# Patient Record
Sex: Female | Born: 1935 | Race: White | Hispanic: No | Smoking: Former smoker
Health system: Southern US, Community
[De-identification: ages and names within clinical notes are randomized; demographics above are authoritative.]

## PROBLEM LIST (undated history)

## (undated) DIAGNOSIS — I517 Cardiomegaly: Secondary | ICD-10-CM

## (undated) DIAGNOSIS — I10 Essential (primary) hypertension: Secondary | ICD-10-CM

## (undated) DIAGNOSIS — L570 Actinic keratosis: Secondary | ICD-10-CM

## (undated) DIAGNOSIS — E785 Hyperlipidemia, unspecified: Secondary | ICD-10-CM

## (undated) DIAGNOSIS — M1990 Unspecified osteoarthritis, unspecified site: Secondary | ICD-10-CM

## (undated) DIAGNOSIS — I509 Heart failure, unspecified: Secondary | ICD-10-CM

## (undated) DIAGNOSIS — R519 Headache, unspecified: Secondary | ICD-10-CM

## (undated) DIAGNOSIS — N6019 Diffuse cystic mastopathy of unspecified breast: Secondary | ICD-10-CM

## (undated) HISTORY — DX: Heart failure, unspecified: I50.9

## (undated) HISTORY — DX: Actinic keratosis: L57.0

## (undated) HISTORY — DX: Unspecified osteoarthritis, unspecified site: M19.90

## (undated) HISTORY — DX: Diffuse cystic mastopathy of unspecified breast: N60.19

## (undated) HISTORY — DX: Hyperlipidemia, unspecified: E78.5

## (undated) HISTORY — DX: Cardiomegaly: I51.7

## (undated) HISTORY — DX: Headache, unspecified: R51.9

## (undated) HISTORY — DX: Essential (primary) hypertension: I10

---

## 1988-12-18 HISTORY — PX: BREAST BIOPSY: SHX20

## 2006-03-06 ENCOUNTER — Ambulatory Visit: Payer: Self-pay | Admitting: Internal Medicine

## 2007-06-06 ENCOUNTER — Ambulatory Visit: Payer: Self-pay | Admitting: Internal Medicine

## 2008-07-29 ENCOUNTER — Ambulatory Visit: Payer: Self-pay | Admitting: Internal Medicine

## 2008-08-05 ENCOUNTER — Ambulatory Visit: Payer: Self-pay | Admitting: Internal Medicine

## 2009-08-02 ENCOUNTER — Ambulatory Visit: Payer: Self-pay | Admitting: Internal Medicine

## 2010-09-21 ENCOUNTER — Ambulatory Visit: Payer: Self-pay | Admitting: Internal Medicine

## 2010-12-07 DIAGNOSIS — C4492 Squamous cell carcinoma of skin, unspecified: Secondary | ICD-10-CM

## 2010-12-07 DIAGNOSIS — Z8589 Personal history of malignant neoplasm of other organs and systems: Secondary | ICD-10-CM

## 2010-12-07 HISTORY — DX: Squamous cell carcinoma of skin, unspecified: C44.92

## 2010-12-07 HISTORY — DX: Personal history of malignant neoplasm of other organs and systems: Z85.89

## 2011-12-15 ENCOUNTER — Ambulatory Visit: Payer: Self-pay | Admitting: Internal Medicine

## 2012-10-24 ENCOUNTER — Ambulatory Visit: Payer: Self-pay | Admitting: Unknown Physician Specialty

## 2012-12-16 ENCOUNTER — Ambulatory Visit: Payer: Self-pay | Admitting: Internal Medicine

## 2013-12-17 ENCOUNTER — Ambulatory Visit: Payer: Self-pay | Admitting: Family Medicine

## 2014-12-22 ENCOUNTER — Ambulatory Visit: Payer: Self-pay | Admitting: Family Medicine

## 2016-02-04 DIAGNOSIS — J029 Acute pharyngitis, unspecified: Secondary | ICD-10-CM | POA: Diagnosis not present

## 2016-04-11 DIAGNOSIS — I781 Nevus, non-neoplastic: Secondary | ICD-10-CM | POA: Diagnosis not present

## 2016-04-11 DIAGNOSIS — L814 Other melanin hyperpigmentation: Secondary | ICD-10-CM | POA: Diagnosis not present

## 2016-04-11 DIAGNOSIS — L578 Other skin changes due to chronic exposure to nonionizing radiation: Secondary | ICD-10-CM | POA: Diagnosis not present

## 2016-04-11 DIAGNOSIS — L570 Actinic keratosis: Secondary | ICD-10-CM | POA: Diagnosis not present

## 2016-04-11 DIAGNOSIS — Z85828 Personal history of other malignant neoplasm of skin: Secondary | ICD-10-CM | POA: Diagnosis not present

## 2016-04-11 DIAGNOSIS — L821 Other seborrheic keratosis: Secondary | ICD-10-CM | POA: Diagnosis not present

## 2016-04-11 DIAGNOSIS — I8393 Asymptomatic varicose veins of bilateral lower extremities: Secondary | ICD-10-CM | POA: Diagnosis not present

## 2016-04-11 DIAGNOSIS — Z1283 Encounter for screening for malignant neoplasm of skin: Secondary | ICD-10-CM | POA: Diagnosis not present

## 2016-04-11 DIAGNOSIS — D1800 Hemangioma unspecified site: Secondary | ICD-10-CM | POA: Diagnosis not present

## 2016-06-08 DIAGNOSIS — H2513 Age-related nuclear cataract, bilateral: Secondary | ICD-10-CM | POA: Diagnosis not present

## 2016-06-08 DIAGNOSIS — H1851 Endothelial corneal dystrophy: Secondary | ICD-10-CM | POA: Diagnosis not present

## 2016-10-24 DIAGNOSIS — M25561 Pain in right knee: Secondary | ICD-10-CM | POA: Diagnosis not present

## 2016-10-24 DIAGNOSIS — G8929 Other chronic pain: Secondary | ICD-10-CM | POA: Diagnosis not present

## 2017-01-23 DIAGNOSIS — Z0000 Encounter for general adult medical examination without abnormal findings: Secondary | ICD-10-CM | POA: Diagnosis not present

## 2017-01-23 DIAGNOSIS — M25562 Pain in left knee: Secondary | ICD-10-CM | POA: Diagnosis not present

## 2017-01-26 ENCOUNTER — Other Ambulatory Visit: Payer: Self-pay | Admitting: Family Medicine

## 2017-01-26 DIAGNOSIS — Z1231 Encounter for screening mammogram for malignant neoplasm of breast: Secondary | ICD-10-CM

## 2017-02-26 ENCOUNTER — Ambulatory Visit: Payer: Self-pay

## 2017-02-26 ENCOUNTER — Ambulatory Visit
Admission: RE | Admit: 2017-02-26 | Discharge: 2017-02-26 | Disposition: A | Discharge status: Home / Self Care | Payer: PPO | Source: Ambulatory Visit | Attending: Family Medicine | Admitting: Family Medicine

## 2017-02-26 DIAGNOSIS — Z1231 Encounter for screening mammogram for malignant neoplasm of breast: Secondary | ICD-10-CM | POA: Diagnosis not present

## 2017-04-17 DIAGNOSIS — D1800 Hemangioma unspecified site: Secondary | ICD-10-CM | POA: Diagnosis not present

## 2017-04-17 DIAGNOSIS — D229 Melanocytic nevi, unspecified: Secondary | ICD-10-CM | POA: Diagnosis not present

## 2017-04-17 DIAGNOSIS — L820 Inflamed seborrheic keratosis: Secondary | ICD-10-CM | POA: Diagnosis not present

## 2017-04-17 DIAGNOSIS — Z1283 Encounter for screening for malignant neoplasm of skin: Secondary | ICD-10-CM | POA: Diagnosis not present

## 2017-04-17 DIAGNOSIS — I8393 Asymptomatic varicose veins of bilateral lower extremities: Secondary | ICD-10-CM | POA: Diagnosis not present

## 2017-04-17 DIAGNOSIS — Z85828 Personal history of other malignant neoplasm of skin: Secondary | ICD-10-CM | POA: Diagnosis not present

## 2017-04-17 DIAGNOSIS — L821 Other seborrheic keratosis: Secondary | ICD-10-CM | POA: Diagnosis not present

## 2017-06-07 DIAGNOSIS — H5203 Hypermetropia, bilateral: Secondary | ICD-10-CM | POA: Diagnosis not present

## 2017-06-07 DIAGNOSIS — H1851 Endothelial corneal dystrophy: Secondary | ICD-10-CM | POA: Diagnosis not present

## 2017-06-07 DIAGNOSIS — H53022 Refractive amblyopia, left eye: Secondary | ICD-10-CM | POA: Diagnosis not present

## 2017-06-07 DIAGNOSIS — H2513 Age-related nuclear cataract, bilateral: Secondary | ICD-10-CM | POA: Diagnosis not present

## 2017-06-07 DIAGNOSIS — H524 Presbyopia: Secondary | ICD-10-CM | POA: Diagnosis not present

## 2017-07-05 DIAGNOSIS — M25561 Pain in right knee: Secondary | ICD-10-CM | POA: Diagnosis not present

## 2017-10-31 DIAGNOSIS — J029 Acute pharyngitis, unspecified: Secondary | ICD-10-CM | POA: Diagnosis not present

## 2018-03-18 ENCOUNTER — Other Ambulatory Visit: Payer: Self-pay | Admitting: Family Medicine

## 2018-03-18 DIAGNOSIS — Z1231 Encounter for screening mammogram for malignant neoplasm of breast: Secondary | ICD-10-CM

## 2018-03-25 ENCOUNTER — Ambulatory Visit
Admission: RE | Admit: 2018-03-25 | Discharge: 2018-03-25 | Disposition: A | Discharge status: Home / Self Care | Payer: PPO | Source: Ambulatory Visit | Attending: Family Medicine | Admitting: Family Medicine

## 2018-03-25 DIAGNOSIS — Z1231 Encounter for screening mammogram for malignant neoplasm of breast: Secondary | ICD-10-CM | POA: Insufficient documentation

## 2018-04-22 DIAGNOSIS — I8393 Asymptomatic varicose veins of bilateral lower extremities: Secondary | ICD-10-CM | POA: Diagnosis not present

## 2018-04-22 DIAGNOSIS — Z1283 Encounter for screening for malignant neoplasm of skin: Secondary | ICD-10-CM | POA: Diagnosis not present

## 2018-04-22 DIAGNOSIS — L821 Other seborrheic keratosis: Secondary | ICD-10-CM | POA: Diagnosis not present

## 2018-04-22 DIAGNOSIS — L570 Actinic keratosis: Secondary | ICD-10-CM | POA: Diagnosis not present

## 2018-04-22 DIAGNOSIS — L578 Other skin changes due to chronic exposure to nonionizing radiation: Secondary | ICD-10-CM | POA: Diagnosis not present

## 2018-04-22 DIAGNOSIS — Z85828 Personal history of other malignant neoplasm of skin: Secondary | ICD-10-CM | POA: Diagnosis not present

## 2018-04-22 DIAGNOSIS — D229 Melanocytic nevi, unspecified: Secondary | ICD-10-CM | POA: Diagnosis not present

## 2018-04-22 DIAGNOSIS — L720 Epidermal cyst: Secondary | ICD-10-CM | POA: Diagnosis not present

## 2018-04-22 DIAGNOSIS — D2262 Melanocytic nevi of left upper limb, including shoulder: Secondary | ICD-10-CM | POA: Diagnosis not present

## 2018-04-22 DIAGNOSIS — D485 Neoplasm of uncertain behavior of skin: Secondary | ICD-10-CM | POA: Diagnosis not present

## 2018-04-22 DIAGNOSIS — D1801 Hemangioma of skin and subcutaneous tissue: Secondary | ICD-10-CM | POA: Diagnosis not present

## 2018-04-22 DIAGNOSIS — L812 Freckles: Secondary | ICD-10-CM | POA: Diagnosis not present

## 2018-05-28 DIAGNOSIS — M25562 Pain in left knee: Secondary | ICD-10-CM | POA: Diagnosis not present

## 2018-06-19 DIAGNOSIS — H1851 Endothelial corneal dystrophy: Secondary | ICD-10-CM | POA: Diagnosis not present

## 2018-06-19 DIAGNOSIS — H53022 Refractive amblyopia, left eye: Secondary | ICD-10-CM | POA: Diagnosis not present

## 2018-06-19 DIAGNOSIS — H524 Presbyopia: Secondary | ICD-10-CM | POA: Diagnosis not present

## 2018-06-19 DIAGNOSIS — H5203 Hypermetropia, bilateral: Secondary | ICD-10-CM | POA: Diagnosis not present

## 2018-06-19 DIAGNOSIS — H2513 Age-related nuclear cataract, bilateral: Secondary | ICD-10-CM | POA: Diagnosis not present

## 2018-10-01 DIAGNOSIS — L900 Lichen sclerosus et atrophicus: Secondary | ICD-10-CM | POA: Diagnosis not present

## 2018-10-01 DIAGNOSIS — N9089 Other specified noninflammatory disorders of vulva and perineum: Secondary | ICD-10-CM | POA: Diagnosis not present

## 2018-10-01 DIAGNOSIS — N3281 Overactive bladder: Secondary | ICD-10-CM | POA: Diagnosis not present

## 2018-12-31 DIAGNOSIS — N904 Leukoplakia of vulva: Secondary | ICD-10-CM | POA: Diagnosis not present

## 2018-12-31 DIAGNOSIS — N3281 Overactive bladder: Secondary | ICD-10-CM | POA: Diagnosis not present

## 2019-03-12 DIAGNOSIS — Z0000 Encounter for general adult medical examination without abnormal findings: Secondary | ICD-10-CM | POA: Diagnosis not present

## 2019-04-07 DIAGNOSIS — M25561 Pain in right knee: Secondary | ICD-10-CM | POA: Diagnosis not present

## 2019-04-07 DIAGNOSIS — M1990 Unspecified osteoarthritis, unspecified site: Secondary | ICD-10-CM | POA: Diagnosis not present

## 2019-05-06 DIAGNOSIS — Z85828 Personal history of other malignant neoplasm of skin: Secondary | ICD-10-CM | POA: Diagnosis not present

## 2019-05-06 DIAGNOSIS — L812 Freckles: Secondary | ICD-10-CM | POA: Diagnosis not present

## 2019-05-06 DIAGNOSIS — D229 Melanocytic nevi, unspecified: Secondary | ICD-10-CM | POA: Diagnosis not present

## 2019-05-06 DIAGNOSIS — I8393 Asymptomatic varicose veins of bilateral lower extremities: Secondary | ICD-10-CM | POA: Diagnosis not present

## 2019-05-06 DIAGNOSIS — Z1283 Encounter for screening for malignant neoplasm of skin: Secondary | ICD-10-CM | POA: Diagnosis not present

## 2019-05-06 DIAGNOSIS — D1800 Hemangioma unspecified site: Secondary | ICD-10-CM | POA: Diagnosis not present

## 2019-05-06 DIAGNOSIS — D225 Melanocytic nevi of trunk: Secondary | ICD-10-CM | POA: Diagnosis not present

## 2019-05-06 DIAGNOSIS — L821 Other seborrheic keratosis: Secondary | ICD-10-CM | POA: Diagnosis not present

## 2019-07-24 DIAGNOSIS — H53022 Refractive amblyopia, left eye: Secondary | ICD-10-CM | POA: Diagnosis not present

## 2019-07-24 DIAGNOSIS — H524 Presbyopia: Secondary | ICD-10-CM | POA: Diagnosis not present

## 2019-07-24 DIAGNOSIS — H2513 Age-related nuclear cataract, bilateral: Secondary | ICD-10-CM | POA: Diagnosis not present

## 2019-07-24 DIAGNOSIS — H1851 Endothelial corneal dystrophy: Secondary | ICD-10-CM | POA: Diagnosis not present

## 2019-10-07 DIAGNOSIS — E785 Hyperlipidemia, unspecified: Secondary | ICD-10-CM | POA: Diagnosis not present

## 2019-10-07 DIAGNOSIS — I10 Essential (primary) hypertension: Secondary | ICD-10-CM | POA: Diagnosis not present

## 2019-11-04 DIAGNOSIS — L821 Other seborrheic keratosis: Secondary | ICD-10-CM | POA: Diagnosis not present

## 2019-11-04 DIAGNOSIS — D1800 Hemangioma unspecified site: Secondary | ICD-10-CM | POA: Diagnosis not present

## 2019-11-04 DIAGNOSIS — L820 Inflamed seborrheic keratosis: Secondary | ICD-10-CM | POA: Diagnosis not present

## 2020-04-06 DIAGNOSIS — Z23 Encounter for immunization: Secondary | ICD-10-CM | POA: Diagnosis not present

## 2020-04-06 DIAGNOSIS — I10 Essential (primary) hypertension: Secondary | ICD-10-CM | POA: Diagnosis not present

## 2020-04-06 DIAGNOSIS — E785 Hyperlipidemia, unspecified: Secondary | ICD-10-CM | POA: Diagnosis not present

## 2020-04-06 DIAGNOSIS — Z0000 Encounter for general adult medical examination without abnormal findings: Secondary | ICD-10-CM | POA: Diagnosis not present

## 2020-04-07 DIAGNOSIS — I10 Essential (primary) hypertension: Secondary | ICD-10-CM | POA: Diagnosis not present

## 2020-04-07 DIAGNOSIS — E785 Hyperlipidemia, unspecified: Secondary | ICD-10-CM | POA: Diagnosis not present

## 2020-08-30 DIAGNOSIS — H524 Presbyopia: Secondary | ICD-10-CM | POA: Diagnosis not present

## 2020-08-30 DIAGNOSIS — H52213 Irregular astigmatism, bilateral: Secondary | ICD-10-CM | POA: Diagnosis not present

## 2020-08-30 DIAGNOSIS — H18511 Endothelial corneal dystrophy, right eye: Secondary | ICD-10-CM | POA: Diagnosis not present

## 2020-08-30 DIAGNOSIS — H5203 Hypermetropia, bilateral: Secondary | ICD-10-CM | POA: Diagnosis not present

## 2020-08-30 DIAGNOSIS — H2513 Age-related nuclear cataract, bilateral: Secondary | ICD-10-CM | POA: Diagnosis not present

## 2020-08-30 DIAGNOSIS — H53022 Refractive amblyopia, left eye: Secondary | ICD-10-CM | POA: Diagnosis not present

## 2020-11-23 ENCOUNTER — Ambulatory Visit: Payer: PPO | Admitting: Dermatology

## 2020-11-23 ENCOUNTER — Other Ambulatory Visit: Payer: Self-pay

## 2020-11-23 DIAGNOSIS — Z1283 Encounter for screening for malignant neoplasm of skin: Secondary | ICD-10-CM

## 2020-11-23 DIAGNOSIS — L578 Other skin changes due to chronic exposure to nonionizing radiation: Secondary | ICD-10-CM | POA: Diagnosis not present

## 2020-11-23 DIAGNOSIS — D229 Melanocytic nevi, unspecified: Secondary | ICD-10-CM

## 2020-11-23 DIAGNOSIS — L814 Other melanin hyperpigmentation: Secondary | ICD-10-CM | POA: Diagnosis not present

## 2020-11-23 DIAGNOSIS — L821 Other seborrheic keratosis: Secondary | ICD-10-CM

## 2020-11-23 DIAGNOSIS — L720 Epidermal cyst: Secondary | ICD-10-CM | POA: Diagnosis not present

## 2020-11-23 DIAGNOSIS — L820 Inflamed seborrheic keratosis: Secondary | ICD-10-CM | POA: Diagnosis not present

## 2020-11-23 DIAGNOSIS — D1800 Hemangioma unspecified site: Secondary | ICD-10-CM | POA: Diagnosis not present

## 2020-11-23 DIAGNOSIS — Z85828 Personal history of other malignant neoplasm of skin: Secondary | ICD-10-CM

## 2020-11-23 DIAGNOSIS — I8393 Asymptomatic varicose veins of bilateral lower extremities: Secondary | ICD-10-CM

## 2020-11-23 NOTE — Progress Notes (Signed)
Follow-Up Visit   Subjective  Alison Brewer is a 85 y.o. female who presents for the following: Annual Exam.  Patient here for full body skin exam and skin cancer screening. Patient is not aware of any new or changing spots. She has a few growths that are raised and get scaly and irritated.   The following portions of the chart were reviewed this encounter and updated as appropriate:      Review of Systems:  No other skin or systemic complaints except as noted in HPI or Assessment and Plan.  Objective  Well appearing patient in no apparent distress; mood and affect are within normal limits.  A full examination was performed including scalp, head, eyes, ears, nose, lips, neck, chest, axillae, abdomen, back, buttocks, bilateral upper extremities, bilateral lower extremities, hands, feet, fingers, toes, fingernails, and toenails. All findings within normal limits unless otherwise noted below.  Objective  Right Mid Back at braline: Subcutaneous nodule.   Objective  Mid upper back at braline x 1, R upper arm x 1, R malar cheek x 1 (3): Erythematous stuck-on, waxy papule    Objective  Bilateral Leg: Telangiectasias    Assessment & Plan  Epidermal inclusion cyst Right Mid Back at braline  Benign-appearing. Exam most consistent with an epidermal inclusion cyst. Discussed that a cyst is a benign growth that can grow over time and sometimes get irritated or inflamed. Recommend observation if it is not bothersome. Discussed option of surgical excision to remove it if it is growing, symptomatic, or other changes noted. Please call for new or changing lesions so they can be evaluated.      Inflamed seborrheic keratosis (3) Mid upper back at braline x 1, R upper arm x 1, R malar cheek x 1  Destruction of lesion - Mid upper back at braline x 1, R upper arm x 1, R malar cheek x 1  Destruction method: cryotherapy   Informed consent: discussed and consent obtained   Lesion  destroyed using liquid nitrogen: Yes   Region frozen until ice ball extended beyond lesion: Yes   Outcome: patient tolerated procedure well with no complications   Post-procedure details: wound care instructions given    Spider veins of both lower extremities Bilateral Leg  Benign, observe.       .Lentigines - Scattered tan macules - Discussed due to sun exposure - Benign, observe - Call for any changes  Seborrheic Keratoses - Stuck-on, waxy, tan-brown papules and plaques  - Discussed benign etiology and prognosis. - Observe - Call for any changes  Melanocytic Nevi - Tan-brown and/or pink-flesh-colored symmetric macules and papules - Benign appearing on exam today - Observation - Call clinic for new or changing moles - Recommend daily use of broad spectrum spf 30+ sunscreen to sun-exposed areas.   Hemangiomas - Red papules - Discussed benign nature - Observe - Call for any changes  Actinic Damage - Chronic, secondary to cumulative UV/sun exposure - diffuse scaly erythematous macules with underlying dyspigmentation - Recommend daily broad spectrum sunscreen SPF 30+ to sun-exposed areas, reapply every 2 hours as needed.  - Call for new or changing lesions.  Skin cancer screening performed today.  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  - Call if any new or changing lesions are  noted between office visits  History of Skin Cancer   - Clear at right upper elbow. Observe for recurrence. - Call clinic for new or changing lesions.   - Recommend regular skin exams, daily broad-spectrum spf 30+ sunscreen use, and photoprotection.     Return in about 1 year (around 11/23/2021) for TBSE.  Graciella Belton, RMA, am acting as scribe for [name], MD  . Documentation: I have reviewed the above documentation for accuracy and completeness, and I agree with the above.  [name] MD

## 2020-11-23 NOTE — Patient Instructions (Addendum)

## 2021-03-31 DIAGNOSIS — H0288A Meibomian gland dysfunction right eye, upper and lower eyelids: Secondary | ICD-10-CM | POA: Diagnosis not present

## 2021-03-31 DIAGNOSIS — H0288B Meibomian gland dysfunction left eye, upper and lower eyelids: Secondary | ICD-10-CM | POA: Diagnosis not present

## 2021-03-31 DIAGNOSIS — H18511 Endothelial corneal dystrophy, right eye: Secondary | ICD-10-CM | POA: Diagnosis not present

## 2021-04-19 DIAGNOSIS — D649 Anemia, unspecified: Secondary | ICD-10-CM | POA: Diagnosis not present

## 2021-04-19 DIAGNOSIS — Z0000 Encounter for general adult medical examination without abnormal findings: Secondary | ICD-10-CM | POA: Diagnosis not present

## 2021-04-19 DIAGNOSIS — E785 Hyperlipidemia, unspecified: Secondary | ICD-10-CM | POA: Diagnosis not present

## 2021-04-28 ENCOUNTER — Other Ambulatory Visit: Payer: Self-pay | Admitting: Family Medicine

## 2021-04-28 DIAGNOSIS — Z1231 Encounter for screening mammogram for malignant neoplasm of breast: Secondary | ICD-10-CM

## 2021-05-11 ENCOUNTER — Ambulatory Visit
Admission: RE | Admit: 2021-05-11 | Discharge: 2021-05-11 | Disposition: A | Discharge status: Home / Self Care | Payer: PPO | Source: Ambulatory Visit | Attending: Family Medicine | Admitting: Family Medicine

## 2021-05-11 ENCOUNTER — Other Ambulatory Visit: Payer: Self-pay

## 2021-05-11 DIAGNOSIS — Z1231 Encounter for screening mammogram for malignant neoplasm of breast: Secondary | ICD-10-CM | POA: Diagnosis not present

## 2021-11-22 DIAGNOSIS — H5203 Hypermetropia, bilateral: Secondary | ICD-10-CM | POA: Diagnosis not present

## 2021-11-22 DIAGNOSIS — H53022 Refractive amblyopia, left eye: Secondary | ICD-10-CM | POA: Diagnosis not present

## 2021-11-22 DIAGNOSIS — H524 Presbyopia: Secondary | ICD-10-CM | POA: Diagnosis not present

## 2021-11-22 DIAGNOSIS — H2513 Age-related nuclear cataract, bilateral: Secondary | ICD-10-CM | POA: Diagnosis not present

## 2021-11-22 DIAGNOSIS — H18513 Endothelial corneal dystrophy, bilateral: Secondary | ICD-10-CM | POA: Diagnosis not present

## 2021-11-22 DIAGNOSIS — H52213 Irregular astigmatism, bilateral: Secondary | ICD-10-CM | POA: Diagnosis not present

## 2021-12-13 ENCOUNTER — Encounter: Payer: PPO | Admitting: Dermatology

## 2022-01-02 ENCOUNTER — Other Ambulatory Visit: Payer: Self-pay

## 2022-01-02 ENCOUNTER — Ambulatory Visit: Payer: PPO | Admitting: Dermatology

## 2022-01-02 ENCOUNTER — Encounter: Payer: Self-pay | Admitting: Dermatology

## 2022-01-02 DIAGNOSIS — L304 Erythema intertrigo: Secondary | ICD-10-CM

## 2022-01-02 DIAGNOSIS — L578 Other skin changes due to chronic exposure to nonionizing radiation: Secondary | ICD-10-CM

## 2022-01-02 DIAGNOSIS — L814 Other melanin hyperpigmentation: Secondary | ICD-10-CM | POA: Diagnosis not present

## 2022-01-02 DIAGNOSIS — D229 Melanocytic nevi, unspecified: Secondary | ICD-10-CM

## 2022-01-02 DIAGNOSIS — Z1283 Encounter for screening for malignant neoplasm of skin: Secondary | ICD-10-CM | POA: Diagnosis not present

## 2022-01-02 DIAGNOSIS — D1800 Hemangioma unspecified site: Secondary | ICD-10-CM

## 2022-01-02 DIAGNOSIS — Z85828 Personal history of other malignant neoplasm of skin: Secondary | ICD-10-CM

## 2022-01-02 DIAGNOSIS — L821 Other seborrheic keratosis: Secondary | ICD-10-CM | POA: Diagnosis not present

## 2022-01-02 DIAGNOSIS — L570 Actinic keratosis: Secondary | ICD-10-CM

## 2022-01-02 MED ORDER — TACROLIMUS 0.1 % EX OINT: TOPICAL_OINTMENT | CUTANEOUS | 1 refills | Status: DC

## 2022-01-02 NOTE — Patient Instructions (Addendum)
Start tacrolimus ointment apply to itchy areas on upper thighs 1-2 times a day until improved. If prescription not covered, recommend OTC Gold Bond Rapid Relief Anti-Itch cream (pramoxine + menthol), CeraVe Anti-itch cream or lotion (pramoxine), Sarna lotion (Original- menthol + camphor or Sensitive- pramoxine) or Eucerin 12 hour Itch Relief lotion (menthol) up to 3 times per day to areas on body that are itchy.  Cryotherapy Aftercare  Wash gently with soap and water everyday.   Apply Vaseline and Band-Aid daily until healed.   If You Need Anything After Your Visit  If you have any questions or concerns for your doctor, please call our main line at [phone] and press option 4 to reach your doctor's medical assistant. If no one answers, please leave a voicemail as directed and we will return your call as soon as possible. Messages left after 4 pm will be answered the following business day.   You may also send [place] a message via [place]. We typically respond to MyChart messages within 1-2 business days.  For prescription refills, please ask your pharmacy to contact our office. Our fax number is [phone].  If you have an urgent issue when the clinic is closed that cannot wait until the next business day, you can page your doctor at the number below.    Please note that while we do our best to be available for urgent issues outside of office hours, we are not available 24/7.   If you have an urgent issue and are unable to reach [place], you may choose to seek medical care at your doctor's office, retail clinic, urgent care center, or emergency room.  If you have a medical emergency, please immediately call 911 or go to the emergency department.  Pager Numbers  - Dr. Nehemiah Massed: [phone]  - Dr. Laurence Ferrari: [phone]  - Dr. Nicole Kindred: [phone]  In the event of inclement weather, please call our main line at [phone] for an update on the status of any delays or  closures.  Dermatology Medication Tips: Please keep the boxes that topical medications come in in order to help keep track of the instructions about where and how to use these. Pharmacies typically print the medication instructions only on the boxes and not directly on the medication tubes.   If your medication is too expensive, please contact our office at [phone] option 4 or send [place] a message through [place].   We are unable to tell what your co-pay for medications will be in advance as this is different depending on your insurance coverage. However, we may be able to find a substitute medication at lower cost or fill out paperwork to get insurance to cover a needed medication.   If a prior authorization is required to get your medication covered by your insurance company, please allow [place] 1-2 business days to complete this process.  Drug prices often vary depending on where the prescription is filled and some pharmacies may offer cheaper prices.  The website www.goodrx.com contains coupons for medications through different pharmacies. The prices here do not account for what the cost may be with help from insurance (it may be cheaper with your insurance), but the website can give you the price if you did not use any insurance.  - You can print the associated coupon and take it with your prescription to the pharmacy.  - You may also stop by our office during regular business hours and pick up a GoodRx coupon card.  - If you need your  prescription sent electronically to a different pharmacy, notify our office through [facility] or by phone at [phone] option 4.     Si Usted Necesita Algo Despus de Su Visita  Tambin puede enviarnos un mensaje a travs de Pharmacist, community. Por lo general respondemos a los mensajes de MyChart en el transcurso de 1 a 2 das hbiles.  Para renovar recetas, por favor pida a su farmacia que se ponga en contacto con nuestra oficina. Harland Dingwall de fax  es [place] [phone].  Si tiene un asunto urgente cuando la clnica est cerrada y que no puede esperar hasta el siguiente da hbil, puede llamar/localizar a su doctor(a) al nmero que aparece a continuacin.   Por favor, tenga en cuenta que aunque hacemos todo lo posible para estar disponibles para asuntos urgentes fuera del horario de [place], no estamos disponibles las 24 horas del da, los 7 das de la [place].   Si tiene un problema urgente y no puede comunicarse con nosotros, puede optar por buscar atencin mdica  en el consultorio de su doctor(a), en una clnica privada, en un centro de atencin urgente o en una sala de emergencias.  Si tiene Engineering geologist, por favor llame inmediatamente al 911 o vaya a la sala de emergencias.  Nmeros de bper  - Dr. Nehemiah Massed: [phone]  - Dra. Moye: [phone]  - Dra. Nicole Kindred: [phone]  En caso de inclemencias del [place], por favor llame a Johnsie Kindred principal al [phone] para una actualizacin sobre el [place] de cualquier retraso o cierre.  Consejos para la medicacin en dermatologa: Por favor, guarde las cajas en las que vienen los medicamentos de uso tpico para ayudarle a seguir las instrucciones sobre dnde y cmo usarlos. Las farmacias generalmente imprimen las instrucciones del medicamento slo en las cajas y no directamente en los tubos del [place].   Si su medicamento es muy caro, por favor, pngase en contacto con Zigmund Daniel llamando al [phone] y presione la opcin 4 o envenos un mensaje a travs de Pharmacist, community.   No podemos decirle cul ser su copago por los medicamentos por adelantado ya que esto es diferente dependiendo de la cobertura de su seguro. Sin embargo, es posible que podamos encontrar un medicamento sustituto a Electrical engineer un formulario para que el seguro cubra el medicamento que se considera necesario.   Si se requiere una autorizacin previa para que su compaa de seguros [place]  su medicamento, por favor permtanos de 1 a 2 das hbiles para completar este proceso.  Los precios de los medicamentos varan con frecuencia dependiendo del Environmental consultant de dnde se surte la receta y alguna farmacias pueden ofrecer precios ms baratos.  El sitio web www.goodrx.com tiene cupones para medicamentos de Airline pilot. Los precios aqu no tienen en cuenta lo que podra costar con la ayuda del seguro (puede ser ms barato con su seguro), pero el sitio web puede darle el precio si no utiliz Research scientist (physical sciences).  - Puede imprimir el cupn correspondiente y llevarlo con su receta a la farmacia.  - Tambin puede pasar por nuestra oficina durante el horario de atencin regular y Charity fundraiser una tarjeta de cupones de GoodRx.  - Si necesita que su receta se enve electrnicamente a una farmacia diferente, informe a nuestra oficina a travs de MyChart de [place] o por telfono llamando al [phone] y presione la opcin 4.

## 2022-01-02 NOTE — Progress Notes (Signed)
Follow-Up Visit   Subjective  Alison Brewer is a 87 y.o. female who presents for the following: Annual Exam.  Patient presents for TBSE. The patient has spots, moles and lesions to be evaluated, some may be new or changing. She has a history of SCC of the left calf inferior. She does have itching of the upper medial thighs. She has tried Jergens lotion to these areas with no improvement.    The following portions of the chart were reviewed this encounter and updated as appropriate:       Review of Systems:  No other skin or systemic complaints except as noted in HPI or Assessment and Plan.  Objective  Well appearing patient in no apparent distress; mood and affect are within normal limits.  A full examination was performed including scalp, head, eyes, ears, nose, lips, neck, chest, axillae, abdomen, back, buttocks, bilateral upper extremities, bilateral lower extremities, hands, feet, fingers, toes, fingernails, and toenails. All findings within normal limits unless otherwise noted below.  medial thighs Erythema of the upper medial thighs, itchy per pt  upper nasal dorsum x 1 Pink scaly macule.    Assessment & Plan  Skin cancer screening performed today.  Actinic Damage - chronic, secondary to cumulative UV radiation exposure/sun exposure over time - diffuse scaly erythematous macules with underlying dyspigmentation - Recommend daily broad spectrum sunscreen SPF 30+ to sun-exposed areas, reapply every 2 hours as needed.  - Recommend staying in the shade or wearing long sleeves, sun glasses (UVA+UVB protection) and wide brim hats (4-inch brim around the entire circumference of the hat). - Call for new or changing lesions.  Lentigines - Scattered tan macules - Due to sun exposure - Benign-appering, observe - Recommend daily broad spectrum sunscreen SPF 30+ to sun-exposed areas, reapply every 2 hours as needed. - Call for any changes  Seborrheic Keratoses - Stuck-on,  waxy, tan-brown papules and/or plaques  - Benign-appearing - Discussed benign etiology and prognosis. - Observe - Call for any changes  Melanocytic Nevi - Tan-brown and/or pink-flesh-colored symmetric macules and papules - Benign appearing on exam today - Observation - Call clinic for new or changing moles - Recommend daily use of broad spectrum spf 30+ sunscreen to sun-exposed areas.   History of Squamous Cell Carcinoma of the Skin - No evidence of recurrence today of the left calf inferior - Recommend regular full body skin exams - Recommend daily broad spectrum sunscreen SPF 30+ to sun-exposed areas, reapply every 2 hours as needed.  - Call if any new or changing lesions are noted between office visits  Erythema intertrigo medial thighs  Intertrigo is a chronic recurrent rash that occurs in skin fold areas that may be associated with friction; heat; moisture; yeast; fungus; and bacteria.  It is exacerbated by increased movement / activity; sweating; and higher atmospheric temperature.  Recommend mild soap and moisturizing cream 1-2 times daily.    Start tacrolimus ointment apply to AA qd/bid until improved dsp 60g 1Rf.   If Rx not covered, recommend OTC Gold Bond Rapid Relief Anti-Itch cream (pramoxine + menthol), CeraVe Anti-itch cream or lotion (pramoxine), Sarna lotion (Original- menthol + camphor or Sensitive- pramoxine) or Eucerin 12 hour Itch Relief lotion (menthol) up to 3 times per day to areas on body that are itchy.   tacrolimus (PROTOPIC) 0.1 % ointment - medial thighs Apply to itchy areas on the upper inner thighs one to two times daily until improved.  AK (actinic keratosis) upper nasal dorsum x 1  Actinic keratoses are precancerous spots that appear secondary to cumulative UV radiation exposure/sun exposure over time. They are chronic with expected duration over 1 year. A portion of actinic keratoses will progress to squamous cell carcinoma of the skin. It is not  possible to reliably predict which spots will progress to skin cancer and so treatment is recommended to prevent development of skin cancer.  Recommend daily broad spectrum sunscreen SPF 30+ to sun-exposed areas, reapply every 2 hours as needed.  Recommend staying in the shade or wearing long sleeves, sun glasses (UVA+UVB protection) and wide brim hats (4-inch brim around the entire circumference of the hat). Call for new or changing lesions.  Destruction of lesion - upper nasal dorsum x 1  Destruction method: cryotherapy   Informed consent: discussed and consent obtained   Lesion destroyed using liquid nitrogen: Yes   Region frozen until ice ball extended beyond lesion: Yes   Outcome: patient tolerated procedure well with no complications   Post-procedure details: wound care instructions given   Additional details:  Prior to procedure, discussed risks of blister formation, small wound, skin dyspigmentation, or rare scar following cryotherapy. Recommend Vaseline ointment to treated areas while healing.   History of Skin Cancer   Right upper elbow clear. Observe for recurrence.  Call clinic for new or changing lesions.   Recommend regular skin exams, daily broad-spectrum spf 30+ sunscreen use, and photoprotection.     Hemangiomas - Red papules - Discussed benign nature - Observe - Call for any changes   Return in about 1 year (around 01/02/2023) for TBSE.  Alison Brewer, CMA, am acting as scribe for [name], MD . Documentation: I have reviewed the above documentation for accuracy and completeness, and I agree with the above.  [name] MD

## 2022-02-13 ENCOUNTER — Emergency Department
Admission: EM | Admit: 2022-02-13 | Discharge: 2022-02-13 | Disposition: A | Discharge status: Home / Self Care | Payer: PPO | Attending: Emergency Medicine | Admitting: Emergency Medicine

## 2022-02-13 ENCOUNTER — Other Ambulatory Visit: Payer: Self-pay

## 2022-02-13 ENCOUNTER — Emergency Department: Payer: PPO

## 2022-02-13 ENCOUNTER — Encounter: Payer: Self-pay | Admitting: Intensive Care

## 2022-02-13 DIAGNOSIS — R9431 Abnormal electrocardiogram [ECG] [EKG]: Secondary | ICD-10-CM | POA: Diagnosis not present

## 2022-02-13 DIAGNOSIS — Z20822 Contact with and (suspected) exposure to covid-19: Secondary | ICD-10-CM | POA: Diagnosis not present

## 2022-02-13 DIAGNOSIS — L989 Disorder of the skin and subcutaneous tissue, unspecified: Secondary | ICD-10-CM | POA: Insufficient documentation

## 2022-02-13 DIAGNOSIS — I517 Cardiomegaly: Secondary | ICD-10-CM | POA: Insufficient documentation

## 2022-02-13 DIAGNOSIS — R609 Edema, unspecified: Secondary | ICD-10-CM

## 2022-02-13 DIAGNOSIS — R7989 Other specified abnormal findings of blood chemistry: Secondary | ICD-10-CM | POA: Insufficient documentation

## 2022-02-13 DIAGNOSIS — I509 Heart failure, unspecified: Secondary | ICD-10-CM | POA: Insufficient documentation

## 2022-02-13 DIAGNOSIS — R600 Localized edema: Secondary | ICD-10-CM | POA: Insufficient documentation

## 2022-02-13 DIAGNOSIS — M7989 Other specified soft tissue disorders: Secondary | ICD-10-CM | POA: Diagnosis present

## 2022-02-13 DIAGNOSIS — R4182 Altered mental status, unspecified: Secondary | ICD-10-CM | POA: Insufficient documentation

## 2022-02-13 LAB — URINALYSIS, COMPLETE (UACMP) WITH MICROSCOPIC
Bilirubin Urine: NEGATIVE
Glucose, UA: NEGATIVE mg/dL
Hgb urine dipstick: NEGATIVE
Ketones, ur: 5 mg/dL — AB
Nitrite: NEGATIVE
Protein, ur: NEGATIVE mg/dL
Specific Gravity, Urine: 1.019 (ref 1.005–1.030)
Squamous Epithelial / HPF: NONE SEEN (ref 0–5)
pH: 5 (ref 5.0–8.0)

## 2022-02-13 LAB — COMPREHENSIVE METABOLIC PANEL
ALT: 22 U/L (ref 0–44)
AST: 28 U/L (ref 15–41)
Albumin: 4 g/dL (ref 3.5–5.0)
Alkaline Phosphatase: 82 U/L (ref 38–126)
Anion gap: 8 (ref 5–15)
BUN: 20 mg/dL (ref 8–23)
CO2: 28 mmol/L (ref 22–32)
Calcium: 10 mg/dL (ref 8.9–10.3)
Chloride: 105 mmol/L (ref 98–111)
Creatinine, Ser: 1.03 mg/dL — ABNORMAL HIGH (ref 0.44–1.00)
GFR, Estimated: 54 mL/min — ABNORMAL LOW (ref >60)
Glucose, Bld: 99 mg/dL (ref 70–99)
Potassium: 4.3 mmol/L (ref 3.5–5.1)
Sodium: 141 mmol/L (ref 135–145)
Total Bilirubin: 0.8 mg/dL (ref 0.3–1.2)
Total Protein: 6.5 g/dL (ref 6.5–8.1)

## 2022-02-13 LAB — RESP PANEL BY RT-PCR (FLU A&B, COVID) ARPGX2
Influenza A by PCR: NEGATIVE
Influenza B by PCR: NEGATIVE
SARS Coronavirus 2 by RT PCR: NEGATIVE

## 2022-02-13 LAB — CBC WITH DIFFERENTIAL/PLATELET
Abs Immature Granulocytes: 0.01 10*3/uL (ref 0.00–0.07)
Basophils Absolute: 0.1 10*3/uL (ref 0.0–0.1)
Basophils Relative: 1 %
Eosinophils Absolute: 0.2 10*3/uL (ref 0.0–0.5)
Eosinophils Relative: 4 %
HCT: 35.8 % — ABNORMAL LOW (ref 36.0–46.0)
Hemoglobin: 11.8 g/dL — ABNORMAL LOW (ref 12.0–15.0)
Immature Granulocytes: 0 %
Lymphocytes Relative: 24 %
Lymphs Abs: 1.1 10*3/uL (ref 0.7–4.0)
MCH: 31 pg (ref 26.0–34.0)
MCHC: 33 g/dL (ref 30.0–36.0)
MCV: 94 fL (ref 80.0–100.0)
Monocytes Absolute: 0.6 10*3/uL (ref 0.1–1.0)
Monocytes Relative: 13 %
Neutro Abs: 2.7 10*3/uL (ref 1.7–7.7)
Neutrophils Relative %: 58 %
Platelets: 184 10*3/uL (ref 150–400)
RBC: 3.81 MIL/uL — ABNORMAL LOW (ref 3.87–5.11)
RDW: 13.3 % (ref 11.5–15.5)
WBC: 4.6 10*3/uL (ref 4.0–10.5)
nRBC: 0 % (ref 0.0–0.2)

## 2022-02-13 LAB — TROPONIN I (HIGH SENSITIVITY): Troponin I (High Sensitivity): 8 ng/L (ref <18)

## 2022-02-13 LAB — LACTIC ACID, PLASMA: Lactic Acid, Venous: 1 mmol/L (ref 0.5–1.9)

## 2022-02-13 LAB — BRAIN NATRIURETIC PEPTIDE: B Natriuretic Peptide: 289.9 pg/mL — ABNORMAL HIGH (ref 0.0–100.0)

## 2022-02-13 MED ORDER — FUROSEMIDE 20 MG PO TABS: 20.0000 mg | ORAL_TABLET | Freq: Every day | ORAL | 11 refills | Status: DC

## 2022-02-13 NOTE — ED Provider Triage Note (Signed)
Emergency Medicine Provider Triage Evaluation Note  Alison Brewer , a 87 y.o. female  was evaluated in triage.  Pt complains of swelling to lower extremities, new onset of dementia.  Review of Systems  Positive: Swelling extremities, altered mental status Negative: Chest pain shortness of  Physical Exam  There were no vitals taken for this visit. Gen:   Awake, no distress   Resp:  Normal effort  MSK:   Moves extremities without difficulty, swelling in lower extremities bilaterally Other:    Medical Decision Making  Medically screening exam initiated at 12:11 PM.  Appropriate orders placed.  Ruba NALANY STEEDLEY was informed that the remainder of the evaluation will be completed by another provider, this initial triage assessment does not replace that evaluation, and the importance of remaining in the ED until their evaluation is complete.  Family member states patient's had new onset of dementia.  New onset of swelling of lower extremities. Due to altered mental status we will start sepsis work-up.  However do feel that the patient is stable.  We can await the lactic acid result prior to starting fluids.   Versie Starks, PA-C 02/13/22 1213

## 2022-02-13 NOTE — ED Triage Notes (Signed)
Patient c/o bilateral leg/ankle swelling. Also c/o red lump under left axillary. Daughter in law reports some AMS.

## 2022-02-13 NOTE — ED Provider Notes (Signed)
[facility] Provider Note    Event Date/Time   First MD Initiated Contact with Patient 02/13/22 1411     (approximate)   History   Leg Swelling   HPI  GIAVANNI Brewer is a 87 y.o. female who presents with complaints of lower extremity swelling which she first noted this morning.  She also describes an itchy bump on her left axilla which is another area of concern for her.  She denies fevers or chills.  Denies shortness of breath, no chest pain.  No history of lower extremity edema.  No new medications reported     Physical Exam   Triage Vital Signs: ED Triage Vitals  Enc Vitals Group     BP 02/13/22 1211 (!) 146/83     Pulse Rate 02/13/22 1211 79     Resp 02/13/22 1211 18     Temp 02/13/22 1211 99.1 F (37.3 C)     Temp Source 02/13/22 1211 Oral     SpO2 02/13/22 1211 95 %     Weight 02/13/22 1212 60.4 kg (133 lb 1.6 oz)     Height 02/13/22 1212 1.676 m (5\' 6" )     Head Circumference --      Peak Flow --      Pain Score 02/13/22 1211 0     Pain Loc --      Pain Edu? --      Excl. in [place]? --     Most recent vital signs: Vitals:   02/13/22 1211  BP: (!) 146/83  Pulse: 79  Resp: 18  Temp: 99.1 F (37.3 C)  SpO2: 95%     General: Awake, no distress.  CV:  Good peripheral perfusion.  Resp:  Normal effort.  Abd:  No distention.  Other:  Mild lower extremity edema bilaterally, no tenderness to palpation.  Left axilla, small red pruritic superficial lesion, does not appear consistent with lymph node   ED Results / Procedures / Treatments   Labs (all labs ordered are listed, but only abnormal results are displayed) Labs Reviewed  COMPREHENSIVE METABOLIC PANEL - Abnormal; Notable for the following components:      Result Value   Creatinine, Ser 1.03 (*)    GFR, Estimated 54 (*)    All other components within normal limits  CBC WITH DIFFERENTIAL/PLATELET - Abnormal; Notable for the following components:   RBC 3.81 (*)     Hemoglobin 11.8 (*)    HCT 35.8 (*)    All other components within normal limits  URINALYSIS, COMPLETE (UACMP) WITH MICROSCOPIC - Abnormal; Notable for the following components:   Color, Urine AMBER (*)    APPearance HAZY (*)    Ketones, ur 5 (*)    Leukocytes,Ua TRACE (*)    Bacteria, UA MANY (*)    All other components within normal limits  BRAIN NATRIURETIC PEPTIDE - Abnormal; Notable for the following components:   B Natriuretic Peptide 289.9 (*)    All other components within normal limits  RESP PANEL BY RT-PCR (FLU A&B, COVID) ARPGX2  CULTURE, BLOOD (ROUTINE X 2)  URINE CULTURE  LACTIC ACID, PLASMA  TROPONIN I (HIGH SENSITIVITY)     EKG  ED ECG REPORT I, [name], the attending physician, personally viewed and interpreted this ECG.  Date: 02/13/2022  Rhythm: normal sinus rhythm QRS Axis: normal Intervals: Incomplete right bundle branch block ST/T Wave abnormalities: normal Narrative Interpretation: no evidence of acute ischemia    RADIOLOGY  Chest x-ray  viewed and interpreted by me, mild cardiomegaly, pending radiology read   PROCEDURES:  Critical Care performed:   Procedures   MEDICATIONS ORDERED IN ED: Medications - No data to display   IMPRESSION / MDM / [place] / ED COURSE  I reviewed the triage vital signs and the nursing notes.  Patient presents with lower extremity swelling as detailed above.  Suspicious for possibly new onset CHF versus unspecified peripheral edema.  No calf tenderness or unilateral pain to suggest DVT   Lab work demonstrates mildly elevated BNP, CBC CMP and high sensor troponin are normal  X-ray demonstrates mild cardiomegaly  Exam is most consistent with peripheral edema associate with CHF given her history and work-up   Considered admission for new onset CHF however she does not have any shortness of breath nor pulmonary edema and has excellent outpatient follow-up. Electrolytes are reassuring,  potassium is normal, will start the patient on low-dose Lasix, she will follow-up with her PCP for further evaluation         FINAL CLINICAL IMPRESSION(S) / ED DIAGNOSES   Final diagnoses:  Peripheral edema     Rx / DC Orders   ED Discharge Orders          Ordered    furosemide (LASIX) 20 MG tablet  Daily        02/13/22 1430             Note:  This document was prepared using Dragon voice recognition software and may include unintentional dictation errors.   [name], MD 02/13/22 [phone]

## 2022-02-16 LAB — URINE CULTURE: Culture: >=100000 — AB

## 2022-02-18 LAB — CULTURE, BLOOD (ROUTINE X 2)
Culture: NO GROWTH
Special Requests: ADEQUATE

## 2022-02-22 DIAGNOSIS — R059 Cough, unspecified: Secondary | ICD-10-CM | POA: Diagnosis not present

## 2022-02-22 DIAGNOSIS — D649 Anemia, unspecified: Secondary | ICD-10-CM | POA: Diagnosis not present

## 2022-02-22 DIAGNOSIS — R413 Other amnesia: Secondary | ICD-10-CM | POA: Diagnosis not present

## 2022-02-22 DIAGNOSIS — I509 Heart failure, unspecified: Secondary | ICD-10-CM | POA: Diagnosis not present

## 2022-02-22 DIAGNOSIS — I517 Cardiomegaly: Secondary | ICD-10-CM | POA: Diagnosis not present

## 2022-02-22 DIAGNOSIS — N3941 Urge incontinence: Secondary | ICD-10-CM | POA: Diagnosis not present

## 2022-02-22 DIAGNOSIS — G8929 Other chronic pain: Secondary | ICD-10-CM | POA: Diagnosis not present

## 2022-02-22 DIAGNOSIS — N390 Urinary tract infection, site not specified: Secondary | ICD-10-CM | POA: Diagnosis not present

## 2022-02-22 DIAGNOSIS — M25561 Pain in right knee: Secondary | ICD-10-CM | POA: Diagnosis not present

## 2022-02-27 ENCOUNTER — Telehealth: Payer: Self-pay

## 2022-02-27 NOTE — Telephone Encounter (Signed)
Notes scanned to referral 

## 2022-03-07 ENCOUNTER — Encounter: Payer: Self-pay | Admitting: Interventional Cardiology

## 2022-03-07 ENCOUNTER — Ambulatory Visit: Payer: PPO | Admitting: Interventional Cardiology

## 2022-03-07 ENCOUNTER — Other Ambulatory Visit: Payer: Self-pay

## 2022-03-07 VITALS — BP 110/60 | HR 106 | Ht 65.0 in | Wt 125.0 lb

## 2022-03-07 DIAGNOSIS — I5032 Chronic diastolic (congestive) heart failure: Secondary | ICD-10-CM | POA: Diagnosis not present

## 2022-03-07 DIAGNOSIS — R5381 Other malaise: Secondary | ICD-10-CM | POA: Diagnosis not present

## 2022-03-07 DIAGNOSIS — R600 Localized edema: Secondary | ICD-10-CM | POA: Diagnosis not present

## 2022-03-07 DIAGNOSIS — I517 Cardiomegaly: Secondary | ICD-10-CM | POA: Diagnosis not present

## 2022-03-07 MED ORDER — FUROSEMIDE 20 MG PO TABS: 20.0000 mg | ORAL_TABLET | Freq: Every day | ORAL | 11 refills | Status: DC | PRN

## 2022-03-07 NOTE — Patient Instructions (Addendum)
Medication Instructions:  ?Your physician has recommended you make the following change in your medication: Change furosemide to 20 mg by mouth daily as needed ? ?*If you need a refill on your cardiac medications before your next appointment, please call your pharmacy* ? ? ?Lab Work: ?none ?If you have labs (blood work) drawn today and your tests are completely normal, you will receive your results only by: ?MyChart Message (if you have MyChart) OR ?A paper copy in the mail ?If you have any lab test that is abnormal or we need to change your treatment, we will call you to review the results. ? ? ?Testing/Procedures: ?none ? ? ?Follow-Up: ?At [facility], you and your health needs are our priority.  As part of our continuing mission to provide you with exceptional heart care, we have created designated Provider Care Teams.  These Care Teams include your primary Cardiologist (physician) and Advanced Practice Providers (APPs -  Physician Assistants and Nurse Practitioners) who all work together to provide you with the care you need, when you need it. ? ?We recommend signing up for the patient portal called "MyChart".  Sign up information is provided on this After Visit Summary.  MyChart is used to connect with patients for Virtual Visits (Telemedicine).  Patients are able to view lab/test results, encounter notes, upcoming appointments, etc.  Non-urgent messages can be sent to your provider as well.   ?To learn more about what you can do with MyChart, go to [email or web address].   ? ?Your next appointment:   ?12 month(s) ? ?The format for your next appointment:   ?In Person ? ?Provider:   ?Larae Grooms, MD   ? ? ?Other Instructions ?Low-Sodium Eating Plan ?Sodium, which is an element that makes up salt, helps you maintain a healthy balance of fluids in your body. Too much sodium can increase your blood pressure and cause fluid and waste to be held in your body. ?Your health care provider or dietitian may  recommend following this plan if you have high blood pressure (hypertension), kidney disease, liver disease, or heart failure. Eating less sodium can help lower your blood pressure, reduce swelling, and protect your heart, liver, and kidneys. ?What are tips for following this plan? ?Reading food labels ?The Nutrition Facts label lists the amount of sodium in one serving of the food. If you eat more than one serving, you must multiply the listed amount of sodium by the number of servings. ?Choose foods with less than 140 mg of sodium per serving. ?Avoid foods with 300 mg of sodium or more per serving. ?Shopping ? ?Look for lower-sodium products, often labeled as "low-sodium" or "no salt added." ?Always check the sodium content, even if foods are labeled as "unsalted" or "no salt added." ?Buy fresh foods. ?Avoid canned foods and pre-made or frozen meals. ?Avoid canned, cured, or processed meats. ?Buy breads that have less than 80 mg of sodium per slice. ?Cooking ? ?Eat more home-cooked food and less restaurant, buffet, and fast food. ?Avoid adding salt when cooking. Use salt-free seasonings or herbs instead of table salt or sea salt. Check with your health care provider or pharmacist before using salt substitutes. ?Cook with plant-based oils, such as canola, sunflower, or olive oil. ?Meal planning ?When eating at a restaurant, ask that your food be prepared with less salt or no salt, if possible. Avoid dishes labeled as brined, pickled, cured, smoked, or made with soy sauce, miso, or teriyaki sauce. ?Avoid foods that contain MSG (monosodium glutamate). MSG  is sometimes added to [place] food, bouillon, and some canned foods. ?Make meals that can be grilled, baked, poached, roasted, or steamed. These are generally made with less sodium. ?General information ?Most people on this plan should limit their sodium intake to 1,500-2,000 mg (milligrams) of sodium each day. ?What foods should I eat? ?Fruits ?Fresh, frozen, or  canned fruit. Fruit juice. ?Vegetables ?Fresh or frozen vegetables. "No salt added" canned vegetables. "No salt added" tomato sauce and paste. Low-sodium or reduced-sodium tomato and vegetable juice. ?Grains ?Low-sodium cereals, including oats, puffed wheat and rice, and shredded wheat. Low-sodium crackers. Unsalted rice. Unsalted pasta. Low-sodium bread. Whole-grain breads and whole-grain pasta. ?Meats and other proteins ?Fresh or frozen (no salt added) meat, poultry, seafood, and fish. Low-sodium canned tuna and salmon. Unsalted nuts. Dried peas, beans, and lentils without added salt. Unsalted canned beans. Eggs. Unsalted nut butters. ?Dairy ?Milk. Soy milk. Cheese that is naturally low in sodium, such as ricotta cheese, fresh mozzarella, or Swiss cheese. Low-sodium or reduced-sodium cheese. Cream cheese. Yogurt. ?Seasonings and condiments ?Fresh and dried herbs and spices. Salt-free seasonings. Low-sodium mustard and ketchup. Sodium-free salad dressing. Sodium-free light mayonnaise. Fresh or refrigerated horseradish. Lemon juice. Vinegar. ?Other foods ?Homemade, reduced-sodium, or low-sodium soups. Unsalted popcorn and pretzels. Low-salt or salt-free chips. ?The items listed above may not be a complete list of foods and beverages you can eat. Contact a dietitian for more information. ?What foods should I avoid? ?Vegetables ?Sauerkraut, pickled vegetables, and relishes. Olives. [place] fries. Onion rings. Regular canned vegetables (not low-sodium or reduced-sodium). Regular canned tomato sauce and paste (not low-sodium or reduced-sodium). Regular tomato and vegetable juice (not low-sodium or reduced-sodium). Frozen vegetables in sauces. ?Grains ?Instant hot cereals. Bread stuffing, pancake, and biscuit mixes. Croutons. Seasoned rice or pasta mixes. Noodle soup cups. Boxed or frozen macaroni and cheese. Regular salted crackers. Self-rising flour. ?Meats and other proteins ?Meat or fish that is salted, canned,  smoked, spiced, or pickled. Precooked or cured meat, such as sausages or meat loaves. Berniece Salines. Ham. Pepperoni. Hot dogs. Corned beef. Chipped beef. Salt pork. Jerky. Pickled herring. Anchovies and sardines. Regular canned tuna. Salted nuts. ?Dairy ?Processed cheese and cheese spreads. Hard cheeses. Cheese curds. Blue cheese. Feta cheese. String cheese. Regular cottage cheese. Buttermilk. Canned milk. ?Fats and oils ?Salted butter. Regular margarine. Ghee. Bacon fat. ?Seasonings and condiments ?Onion salt, garlic salt, seasoned salt, table salt, and sea salt. Canned and packaged gravies. Worcestershire sauce. Tartar sauce. Barbecue sauce. Teriyaki sauce. Soy sauce, including reduced-sodium. Steak sauce. Fish sauce. Oyster sauce. Cocktail sauce. Horseradish that you find on the shelf. Regular ketchup and mustard. Meat flavorings and tenderizers. Bouillon cubes. Hot sauce. Pre-made or packaged marinades. Pre-made or packaged taco seasonings. Relishes. Regular salad dressings. Salsa. ?Other foods ?Salted popcorn and pretzels. Corn chips and puffs. Potato and tortilla chips. Canned or dried soups. Pizza. Frozen entrees and pot pies. ?The items listed above may not be a complete list of foods and beverages you should avoid. Contact a dietitian for more information. ?Summary ?Eating less sodium can help lower your blood pressure, reduce swelling, and protect your heart, liver, and kidneys. ?Most people on this plan should limit their sodium intake to 1,500-2,000 mg (milligrams) of sodium each day. ?Canned, boxed, and frozen foods are high in sodium. Restaurant foods, fast foods, and pizza are also very high in sodium. You also get sodium by adding salt to food. ?Try to cook at home, eat more fresh fruits and vegetables, and eat less fast food and  canned, processed, or prepared foods. ?This information is not intended to replace advice given to you by your health care provider. Make sure you discuss any questions you have  with your health care provider. ?Document Revised: 01/09/2020 Document Reviewed: 11/05/2019 ?Elsevier Patient Education ? 2022 [place]. ? ? ?

## 2022-03-07 NOTE — Progress Notes (Signed)
?  ?Cardiology Office Note ? ? ?Date:  03/07/2022  ? ?ID:  Alison Brewer, DOB 07/28/1935, MRN [id number] ? ?PCP:  Alison Pink, MD  ? ? ?Chief Complaint  ?Patient presents with  ? Establish Care  ? ?cardiomegaly ? ?Wt Readings from Last 3 Encounters:  ?03/07/22 125 lb (56.7 kg)  ?02/13/22 133 lb 1.6 oz (60.4 kg)  ?  ? ?  ?History of Present Illness: ?Alison Brewer is a 87 y.o. female who is being seen today for the evaluation of cardiomegaly at the request of Alison Pink, MD.  ? ?She had some swelling in her ankles and altered mental status in Feb 2023.  She had a CXR which showed borderline cardiomegaly. She is sent her efor further eval. ? ?Denies : Chest pain. Dizziness. Leg edema. Nitroglycerin use. Orthopnea. Palpitations. Paroxysmal nocturnal dyspnea. Shortness of breath. Syncope.   ? ?She used to walk more.  She walks while shopping.  ? ?She was given Lasix in the [facility] ER.  She got dehydrated and then got confused.  She fell in the bathroom.  Since that time, she went to the PMD and she was diagnosed with UTI which caused the confusion.   No other recent falls. BNP was mildly elevated.  ? ? ? ?Past Medical History:  ?Diagnosis Date  ? Arthritis   ? CHF (congestive heart failure) ([place])   ? Fibrocystic breast disease   ? Frequent headaches   ? History of squamous cell carcinoma 12/07/2010  ? left calf inferior/EDC  ? HTN (hypertension)   ? Hyperlipidemia   ? Mild cardiomegaly   ? Squamous cell carcinoma of skin 12/07/2010  ? left calf inferior  ? ? ?Past Surgical History:  ?Procedure Laterality Date  ? BREAST BIOPSY Right 1990  ? neg  ? ? ? ?Current Outpatient Medications  ?Medication Sig Dispense Refill  ? aspirin 81 MG EC tablet Take by mouth.    ? ciprofloxacin (CIPRO) 250 MG tablet Take 250 mg by mouth 2 (two) times daily.    ? furosemide (LASIX) 20 MG tablet Take 1 tablet (20 mg total) by mouth daily. 30 tablet 11  ? ?No current facility-administered medications for this visit.  ? ? ?Allergies:    Patient has no known allergies.  ? ? ?Social History:  The patient  reports that she has quit smoking. Her smoking use included cigarettes. She has never used smokeless tobacco. She reports that she does not drink alcohol and does not use drugs.  ? ?Family History:  The patient's family history includes Breast cancer in her maternal aunt; Coronary artery disease in her father; Pancreatic cancer in her mother.  ? ? ?ROS:  Please see the history of present illness.   Otherwise, review of systems are positive for not allowed to drive.   All other systems are reviewed and negative.  ? ? ?PHYSICAL EXAM: ?VS:  BP 110/60   Pulse (!) 106   Ht '5\' 5"'$  (1.651 m)   Wt 125 lb (56.7 kg)   SpO2 99%   BMI 20.80 kg/m?  , BMI Body mass index is 20.8 kg/m?. ?GEN: Well nourished, well developed, in no acute distress, frail ?HEENT: normal ?Neck: no JVD, carotid bruits, or masses ?Cardiac: RRR; no murmurs, rubs, or gallops,tr ankle edema bilaterally ?Respiratory:  clear to auscultation bilaterally, normal work of breathing ?GI: soft, nontender, nondistended, + BS ?MS: no deformity or atrophy ?Skin: warm and dry, no rash; spider veins noted on the legs ?Neuro:  Strength and sensation are intact ?Psych: euthymic mood, full affect ? ? ?EKG:   ?The ekg ordered today demonstrates NSR, IRBBB ? ? ?Recent Labs: ?02/13/2022: ALT 22; B Natriuretic Peptide 289.9; BUN 20; Creatinine, Ser 1.03; Hemoglobin 11.8; Platelets 184; Potassium 4.3; Sodium 141  ? ?Lipid Panel ?No results found for: CHOL, TRIG, HDL, CHOLHDL, VLDL, LDLCALC, LDLDIRECT ?  ?Other studies Reviewed: ?Additional studies/ records that were reviewed today with results demonstrating: CXR, labs reviewed. ? ? ?ASSESSMENT AND PLAN: ? ?Alison edema: Likely age related venous insufficiency rather than systolic heart failure.  .Elevate legs is much as possible use compression stockings, 10-25 mm Hg.  I think in her case, it may be better to manage this conservatively with furosemide being the  last option.  She will monitor the swelling in her legs and monitor for any shortness of breath.  Could use the furosemide more on an as-needed basis as opposed to daily, given that she had a problem with dehydration and falling after starting the medicine.  Borderline cardiomegaly by xray.  No pulm edema. ?Likely Chronic diastolic heart failure just based on her age: From age-related stiffening of the heart.  Minimize processed foods.  Minimizing salt intake.  Okay to use the Lasix as needed when the ankles are swelling if shortness of breath develops or swelling becomes a recurrent problem, we can obtain an echocardiogram to look at  heart function.  Exam does not show signs of systolic heart failure.   ?Deconditioned: As exercise has decreased, this has worsened.  Try to safely increase exercise.  Avoid falls.  Use cane now.  Reassuring that she is feeling better in general now.  Hopefully, with more safe activity, her energy level will improve. ?Had some confusion as well in the setting of a UTI.  There may be some mild baseline confusion as well.  She asked me to contact her family member who took her car from her. ? ? ?Current medicines are reviewed at length with the patient today.  The patient concerns regarding her medicines were addressed. ? ?The following changes have been made:  No change ? ?Labs/ tests ordered today include:  ?No orders of the defined types were placed in this encounter. ? ? ?Recommend 150 minutes/week of aerobic exercise ?Low fat, low carb, high fiber diet recommended ? ?Disposition:   FU in 1 year ? ? ?Signed, ?Larae Grooms, MD  ?03/07/2022 9:03 AM    ?[facility] ?[address], [place], [place]  [address] ?Phone: [phone]; Fax: [phone]  ? ?

## 2022-04-17 ENCOUNTER — Telehealth: Payer: Self-pay | Admitting: Physician Assistant

## 2022-04-18 ENCOUNTER — Ambulatory Visit
Admission: EM | Admit: 2022-04-18 | Discharge: 2022-04-18 | Disposition: A | Discharge status: Home / Self Care | Payer: PPO | Attending: Family Medicine | Admitting: Family Medicine

## 2022-04-18 ENCOUNTER — Ambulatory Visit (INDEPENDENT_AMBULATORY_CARE_PROVIDER_SITE_OTHER): Payer: PPO

## 2022-04-18 DIAGNOSIS — S79911A Unspecified injury of right hip, initial encounter: Secondary | ICD-10-CM | POA: Diagnosis not present

## 2022-04-18 DIAGNOSIS — J189 Pneumonia, unspecified organism: Secondary | ICD-10-CM

## 2022-04-18 DIAGNOSIS — R0781 Pleurodynia: Secondary | ICD-10-CM

## 2022-04-18 DIAGNOSIS — Z87440 Personal history of urinary (tract) infections: Secondary | ICD-10-CM

## 2022-04-18 DIAGNOSIS — M25551 Pain in right hip: Secondary | ICD-10-CM

## 2022-04-18 DIAGNOSIS — M25552 Pain in left hip: Secondary | ICD-10-CM | POA: Diagnosis not present

## 2022-04-18 DIAGNOSIS — W19XXXA Unspecified fall, initial encounter: Secondary | ICD-10-CM | POA: Diagnosis not present

## 2022-04-18 DIAGNOSIS — R918 Other nonspecific abnormal finding of lung field: Secondary | ICD-10-CM | POA: Diagnosis not present

## 2022-04-18 DIAGNOSIS — R0782 Intercostal pain: Secondary | ICD-10-CM

## 2022-04-18 LAB — POCT URINALYSIS DIP (MANUAL ENTRY)
Bilirubin, UA: NEGATIVE
Glucose, UA: NEGATIVE mg/dL
Ketones, POC UA: NEGATIVE mg/dL
Leukocytes, UA: NEGATIVE
Nitrite, UA: NEGATIVE
Protein Ur, POC: 100 mg/dL — AB
Spec Grav, UA: >=1.03 — AB (ref 1.010–1.025)
Urobilinogen, UA: 0.2 E.U./dL
pH, UA: 5 (ref 5.0–8.0)

## 2022-04-18 MED ORDER — GUAIFENESIN ER 600 MG PO TB12: 600.0000 mg | ORAL_TABLET | Freq: Two times a day (BID) | ORAL | 0 refills | Status: DC | PRN

## 2022-04-18 MED ORDER — DOXYCYCLINE HYCLATE 100 MG PO CAPS: 100.0000 mg | ORAL_CAPSULE | Freq: Two times a day (BID) | ORAL | 0 refills | Status: DC

## 2022-04-18 MED ORDER — BENZONATATE 200 MG PO CAPS
200.0000 mg | ORAL_CAPSULE | Freq: Three times a day (TID) | ORAL | 0 refills | Status: DC | PRN
Start: 2022-04-18 — End: 2022-05-29

## 2022-04-18 NOTE — ED Triage Notes (Signed)
Pt's grandson states that today she fell getting up from the couch and tripped and hit her ribs in the back on the right side and her right elbow ? ?Alison Brewer states she has been congested  ? ?Alison Brewer thinks she may have a reoccurring UTI.  ? ?Pt states she is having frequent urination and some burning ? ?Denies Fever ?

## 2022-04-20 ENCOUNTER — Ambulatory Visit: Payer: PPO | Admitting: Physician Assistant

## 2022-04-22 NOTE — ED Provider Notes (Signed)
?[facility] ? ? ? ?CSN: [id number] ?Arrival date & time: 04/18/22  1258 ? ? ?  ? ?History   ?Chief Complaint ?Chief Complaint  ?Patient presents with  ? Cough  ?  UTI symptoms, cough and fell this morning  ? ? ?HPI ?Alison Brewer is a 87 y.o. female.  ? ?Presenting today with right rib/side and right hip pain following a fall that occurred today where she landed on the right side. Did not hit head or lose consciousness during fall and states pain waxes and wanes. Denies loss of ROM, numbness, tingling, weakness though grandson who is with her states she's moving slightly slower than usual. Uses a walker to ambulate both today and at baseline. Also complaining of congestion, hacking cough. Not trying anything OTC for sxs. Requesting to be checked for UTI as she tends to have recurrent UTIs and has some frequency and burning. Denies fever, chills, abdominal pain, N/V/D.  ? ? ?Past Medical History:  ?Diagnosis Date  ? Arthritis   ? CHF (congestive heart failure) ([place])   ? Fibrocystic breast disease   ? Frequent headaches   ? History of squamous cell carcinoma 12/07/2010  ? left calf inferior/EDC  ? HTN (hypertension)   ? Hyperlipidemia   ? Mild cardiomegaly   ? Squamous cell carcinoma of skin 12/07/2010  ? left calf inferior  ? ? ?There are no problems to display for this patient. ? ? ?Past Surgical History:  ?Procedure Laterality Date  ? BREAST BIOPSY Right 1990  ? neg  ? ? ?OB History   ?No obstetric history on file. ?  ? ? ? ?Home Medications   ? ?Prior to Admission medications   ?Medication Sig Start Date End Date Taking? Authorizing Provider  ?benzonatate (TESSALON) 200 MG capsule Take 1 capsule (200 mg total) by mouth 3 (three) times daily as needed for cough. 04/18/22  Yes Volney American, PA-C  ?doxycycline (VIBRAMYCIN) 100 MG capsule Take 1 capsule (100 mg total) by mouth 2 (two) times daily. 04/18/22  Yes Volney American, PA-C  ?guaiFENesin (MUCINEX) 600 MG 12 hr tablet Take 1 tablet (600  mg total) by mouth 2 (two) times daily as needed. 04/18/22  Yes Volney American, PA-C  ?aspirin 81 MG EC tablet Take by mouth.    [provider]  ?ciprofloxacin (CIPRO) 250 MG tablet Take 250 mg by mouth 2 (two) times daily.    [provider]  ?furosemide (LASIX) 20 MG tablet Take 1 tablet (20 mg total) by mouth daily as needed. 03/07/22 03/07/23  Jettie Booze, MD  ? ? ?Family History ?Family History  ?Problem Relation Age of Onset  ? Pancreatic cancer Mother   ? Coronary artery disease Father   ? Breast cancer Maternal Aunt   ? ? ?Social History ?Social History  ? ?Tobacco Use  ? Smoking status: Former  ?  Types: Cigarettes  ? Smokeless tobacco: Never  ?Vaping Use  ? Vaping Use: Never used  ?Substance Use Topics  ? Alcohol use: Never  ? Drug use: Never  ? ? ? ?Allergies   ?Patient has no known allergies. ? ? ?Review of Systems ?Review of Systems ?PER HPI ? ?Physical Exam ?Triage Vital Signs ?ED Triage Vitals [04/18/22 1440]  ?Enc Vitals Group  ?   BP 127/73  ?   Pulse Rate 83  ?   Resp 16  ?   Temp 99.2 ?F (37.3 ?C)  ?   Temp Source Oral  ?  SpO2 93 %  ?   Weight   ?   Height   ?   Head Circumference   ?   Peak Flow   ?   Pain Score 5  ?   Pain Loc   ?   Pain Edu?   ?   Excl. in [place]?   ? ?No data found. ? ?Updated Vital Signs ?BP 127/73 (BP Location: Right Arm)   Pulse 83   Temp 99.2 ?F (37.3 ?C) (Oral)   Resp 16   SpO2 93%  ? ?Visual Acuity ?Right Eye Distance:   ?Left Eye Distance:   ?Bilateral Distance:   ? ?Right Eye Near:   ?Left Eye Near:    ?Bilateral Near:    ? ?Physical Exam ?Vitals and nursing note reviewed.  ?Constitutional:   ?   General: She is not in acute distress. ?   Appearance: She is not ill-appearing.  ?HENT:  ?   Head: Atraumatic.  ?   Nose: Congestion present.  ?   Mouth/Throat:  ?   Mouth: Mucous membranes are moist.  ?Eyes:  ?   Extraocular Movements: Extraocular movements intact.  ?   Conjunctiva/sclera: Conjunctivae normal.  ?Cardiovascular:  ?   Rate and  Rhythm: Normal rate and regular rhythm.  ?   Heart sounds: Normal heart sounds.  ?Pulmonary:  ?   Effort: Pulmonary effort is normal.  ?   Breath sounds: Normal breath sounds. No wheezing.  ?   Comments: Speaking in full sentences, breathing comfortably on room air ?Musculoskeletal:     ?   General: Tenderness and signs of injury present. No swelling or deformity. Normal range of motion.  ?   Cervical back: Normal range of motion and neck supple.  ?   Comments: Ambulating with a walker at her baseline per patient and grandson who is primary caregiver. No significant ttp right hip, ROM intact. TTP diffusely right ribs anteriorly and laterally with no deformity palpable  ?Skin: ?   General: Skin is warm and dry.  ?   Findings: No bruising.  ?Neurological:  ?   Mental Status: She is alert and oriented to person, place, and time.  ?Psychiatric:     ?   Mood and Affect: Mood normal.     ?   Thought Content: Thought content normal.     ?   Judgment: Judgment normal.  ? ? ? ?UC Treatments / Results  ?Labs ?(all labs ordered are listed, but only abnormal results are displayed) ?Labs Reviewed  ?POCT URINALYSIS DIP (MANUAL ENTRY) - Abnormal; Notable for the following components:  ?    Result Value  ? Spec Grav, UA >=1.030 (*)   ? Blood, UA small (*)   ? Protein Ur, POC =100 (*)   ? All other components within normal limits  ? ? ?EKG ? ? ?Radiology ?No results found. ? ?Procedures ?Procedures (including critical care time) ? ?Medications Ordered in UC ?Medications - No data to display ? ?Initial Impression / Assessment and Plan / UC Course  ?I have reviewed the triage vital signs and the nursing notes. ? ?Pertinent labs & imaging results that were available during my care of the patient were reviewed by me and considered in my medical decision making (see chart for details). ? ?  ? ?No evidence of UTI, reassurance given. Chest/rib x-ray showing no bony abnormality but possible early pneumonia. Treat with doxycycline, mucinex,  tessalon, OTC remedies. Regarding her right hip x-ray, likely no  bony injury but unclear based on poor ability to position during imaging. Recommended if continuing to have pain to f/u with Ortho for recheck. Return for worsening sxs. ? ?Final Clinical Impressions(s) / UC Diagnoses  ? ?Final diagnoses:  ?Rib pain on right side  ?Right hip pain  ?Pneumonia of both lungs due to infectious organism, unspecified part of lung  ?History of UTI  ?Fall, initial encounter  ? ?Discharge Instructions   ?None ?  ? ?ED Prescriptions   ? ? Medication Sig Dispense Auth. Provider  ? doxycycline (VIBRAMYCIN) 100 MG capsule Take 1 capsule (100 mg total) by mouth 2 (two) times daily. 14 capsule Volney American, [place]  ? guaiFENesin (MUCINEX) 600 MG 12 hr tablet Take 1 tablet (600 mg total) by mouth 2 (two) times daily as needed. 20 tablet Volney American, [place]  ? benzonatate (TESSALON) 200 MG capsule Take 1 capsule (200 mg total) by mouth 3 (three) times daily as needed for cough. 20 capsule Volney American, [place]  ? ?  ? ?PDMP not reviewed this encounter. ?  ?Volney American, PA-C ?04/22/22 2252 ? ?

## 2022-04-24 NOTE — Telephone Encounter (Signed)
Opened in error

## 2022-04-26 ENCOUNTER — Ambulatory Visit: Payer: PPO | Admitting: Orthopedic Surgery

## 2022-04-26 ENCOUNTER — Encounter: Payer: Self-pay | Admitting: Orthopedic Surgery

## 2022-04-26 VITALS — BP 164/66 | HR 69 | Ht 65.0 in | Wt 128.6 lb

## 2022-04-26 DIAGNOSIS — M25551 Pain in right hip: Secondary | ICD-10-CM | POA: Diagnosis not present

## 2022-04-26 NOTE — Patient Instructions (Signed)
If you are doing well in the next month, and do not think you will need to be seen again, you can call to cancel the appointment.  ?

## 2022-04-26 NOTE — Progress Notes (Signed)
New Patient Visit ? ?Assessment: ?Alison Brewer is a 87 y.o. female with the following: ?1. Pain in right hip ? ?Plan: ?Joya Martyr, right-sided, and has some tenderness and bruising in her lower back and right buttock area.  She does not have tenderness to palpation directly over the greater trochanter.  Radiographs obtained in the urgent care setting are not convincing for fracture, nor are her physical exam findings.  Nonetheless, I have urged her to remain cautious.  Continue to ambulate with the assistance of a cane.  I do think this will continue to get better, but would like to see her back in approximately 4 weeks.  If she has healed, and does not feel as though she needs to be seen, have asked them to contact the clinic to cancel the appointment.  Medications as needed.  Weightbearing as tolerated on the right leg. ? ?Follow-up: ?Return in about 4 weeks (around 05/24/2022). ? ?Subjective: ? ?Chief Complaint  ?Patient presents with  ? Back Pain  ?  Lower- right from fall ?  ? ? ?History of Present Illness: ?Alison Brewer is a 87 y.o. female who presents for evaluation of right hip pain.  Approximate 1 week ago, she lost her balance, and fell at home.  She landed on her right lower back, hip and rib area.  She has significant pain on that side of her body.  She presented to the urgent care, where x-rays were obtained.  There were no definitive fractures, although they were concerning findings on the hip x-ray for a minimally displaced fracture of the greater trochanter.  She continues to ambulate, using a cane.  Her pain is improved.  She has bruising in her lower back, but not over her lateral hip. ? ? ?Review of Systems: ?No fevers or chills ?No numbness or tingling ?No chest pain ?No shortness of breath ?No bowel or bladder dysfunction ?No GI distress ?No headaches ? ? ?Medical History: ? ?Past Medical History:  ?Diagnosis Date  ? Arthritis   ? CHF (congestive heart failure) ([place])   ? Fibrocystic breast  disease   ? Frequent headaches   ? History of squamous cell carcinoma 12/07/2010  ? left calf inferior/EDC  ? HTN (hypertension)   ? Hyperlipidemia   ? Mild cardiomegaly   ? Squamous cell carcinoma of skin 12/07/2010  ? left calf inferior  ? ? ?Past Surgical History:  ?Procedure Laterality Date  ? BREAST BIOPSY Right 1990  ? neg  ? ? ?Family History  ?Problem Relation Age of Onset  ? Pancreatic cancer Mother   ? Coronary artery disease Father   ? Breast cancer Maternal Aunt   ? ?Social History  ? ?Tobacco Use  ? Smoking status: Former  ?  Types: Cigarettes  ? Smokeless tobacco: Never  ?Vaping Use  ? Vaping Use: Never used  ?Substance Use Topics  ? Alcohol use: Never  ? Drug use: Never  ? ? ?No Known Allergies ? ?Current Meds  ?Medication Sig  ? aspirin 81 MG EC tablet Take by mouth.  ? benzonatate (TESSALON) 200 MG capsule Take 1 capsule (200 mg total) by mouth 3 (three) times daily as needed for cough.  ? CALCIUM PO Take by mouth. 500 MG PER PATIENT  ? guaiFENesin (MUCINEX) 600 MG 12 hr tablet Take 1 tablet (600 mg total) by mouth 2 (two) times daily as needed.  ? Multiple Vitamin (MULTIVITAMIN) tablet Take 1 tablet by mouth daily.  ? ? ?Objective: ?BP Marland Kitchen)  164/66   Pulse 69   Ht '5\' 5"'$  (1.651 m)   Wt 128 lb 9.6 oz (58.3 kg)   BMI 21.40 kg/m?  ? ?Physical Exam: ? ?General: Elderly female., Alert and oriented., and No acute distress. ?Gait: Ambulates with the assistance of a cane. ? ?Evaluation of the right hip demonstrates no swelling.  No bruising is appreciated.  No tenderness to palpation over the greater trochanter.  She has no pain with axial loading.  No pain with resisted hip abduction.  She does have some bruising over the right side of her lower back.  This area is tender to palpation. ? ?IMAGING: ?I personally reviewed images previously obtained from the ED ? ?Right hip x-ray ? ?Diffuse decreased bone mineralization is only stability performed ?frontal views limits evaluation for an acute fracture. No  definitive ?acute fracture is visualized, howeverthere is mild curvilinear ?lucency overlying the right greater trochanter that may represent ?low bone mineralization, normal nutrient foramina, or a nondisplaced ?fracture. ? ?New Medications:  ?No orders of the defined types were placed in this encounter. ? ? ? ? ?Mordecai Rasmussen, MD ? ?04/26/2022 ?1:28 PM ? ? ?

## 2022-05-05 ENCOUNTER — Encounter: Payer: Self-pay | Admitting: Physician Assistant

## 2022-05-05 ENCOUNTER — Ambulatory Visit: Payer: PPO | Admitting: Physician Assistant

## 2022-05-05 VITALS — BP 135/67 | HR 69 | Resp 18 | Ht 65.0 in | Wt 126.0 lb

## 2022-05-05 DIAGNOSIS — R413 Other amnesia: Secondary | ICD-10-CM

## 2022-05-05 DIAGNOSIS — F0280 Dementia in other diseases classified elsewhere without behavioral disturbance: Secondary | ICD-10-CM

## 2022-05-05 DIAGNOSIS — G309 Alzheimer's disease, unspecified: Secondary | ICD-10-CM

## 2022-05-05 NOTE — Progress Notes (Signed)
Assessment/Plan:   Alison Brewer is a very pleasant 87 y.o. year old [name] with  a history of hypertension,anemia, CHF, hyperlipidemia, anxiety, depression seen today for evaluation of memory loss. MoCA today is 13/30  delayed recall 0/5 and visuospatial 0/5 as well     Recommendations:   Dementia likely due to Alzheimer's disease   MRI brain with/without contrast to assess for underlying structural abnormality and assess vascular load  Check B12, TSH  Patient is not interested to starting any medications until results of MRI and labs are available for review. Follow-up in 1 month to discuss further plans.  Subjective:    The patient is seen in neurologic consultation at the request of [place] Pink, MD for the evaluation of memory.  The patient is accompanied by her grandson who supplements the history.   How long did patient have memory difficulties? Every year is worse, but these may have been present for about 15 years, and yet, over the last 10 years has been wears.  Changes have been progressive, but it was brought to her grandsons attention when she could not remember family members birthdays which was unusual for her.  Patient is in denial that there is anything wrong with her.   The person that noticed the biggest changes was her grandson who lives with her.  She is very concerned about numbers, she cannot "put them together ".  In addition, if the topic is changed, she may be "stuck on the prior subject ".  This has been noticed by myself during this visit as well.   She has had a recent UTI, and dehydration, in which she had acute mental status changes, "she bounced back, except for the numbers ". Patient lives with: Her grandson and his wife from [place], who moved to live with her about 3 years ago.  Her husband died in 03-19-15.   repeats oneself? She always did but is worse over the last 3 y. Disoriented when walking into a room?  Patient denies   Leaving objects in  unusual places?  Patient denies.  She is very organized, and she puts the silverware back in the drawer according to her grandson.  However, she likes to hoard, does not like to throw things, although does not living in "weird "places.  Ambulates  with difficulty?   Patient denies. She walks more than before.  She lives in [place], and likes to walk around the house frequently.  Recent falls?  Patient denies   Any head injuries?  Patient denies   History of seizures?   Patient denies   Wandering behavior?  Patient denies   Patient drives?   Patient no longer drives she had a couple of episodes getting lost when going to the pizza place  Any mood changes such irritability agitation?  Patient denies   Any history of depression?:  Patient denies   Hallucinations?  Patient denies   Paranoia?  Patient denies   Patient reports that he sleeps well without vivid dreams, REM behavior or sleepwalking   History of sleep apnea?  Patient denies   Any hygiene concerns?  Patient denies   Independent of bathing and dressing?  Endorsed  Does the patient needs help with medications?  Patient denies.  She never took many medications. Who is in charge of the finances?  Stepchildren  Patient have trouble swallowing? Patient denies   Does the patient cook? Her grandson does  Any kitchen accidents such as leaving  the stove on? Patient denies   Any headaches?  Patient denies   The double vision? Patient denies   Any focal numbness or tingling?  " I am not sure " Chronic back pain Patient denies   Unilateral weakness?  Patient denies   Any tremors?  Patient denies   Any history of anosmia? Decrease in the sense of smell , and decreased hearing  Any incontinence of urine? Endorses incontinence  Patient had a recent E. Coli UTI  Any bowel dysfunction?   Patient occasionally uses prunes  History of heavy alcohol intake?  Patient denies   History of heavy tobacco use?  Patient denies   Family history of  dementia?  father may have had, Alzheimer's disease    Labs 02/22/22 TSH 2.301, B12 1381 No Known Allergies  Current Outpatient Medications  Medication Instructions   aspirin 81 MG EC tablet Oral   benzonatate (TESSALON) 200 mg, Oral, 3 times daily PRN   CALCIUM PO Oral, 500 MG PER PATIENT   ciprofloxacin (CIPRO) 250 mg, Oral, 2 times daily   doxycycline (VIBRAMYCIN) 100 mg, Oral, 2 times daily   furosemide (LASIX) 20 mg, Oral, Daily PRN   guaiFENesin (MUCINEX) 600 mg, Oral, 2 times daily PRN   Multiple Vitamin (MULTIVITAMIN) tablet 1 tablet, Oral, Daily     VITALS:   Vitals:   05/05/22 0957  BP: 135/67  Pulse: 69  Resp: 18  SpO2: 98%  Weight: 126 lb (57.2 kg)  Height: '5\' 5"'$  (1.651 m)       View : No data to display.          PHYSICAL EXAM   HEENT:  Normocephalic, atraumatic. The mucous membranes are moist. The superficial temporal arteries are without ropiness or tenderness. Cardiovascular: Regular rate and rhythm. Lungs: Clear to auscultation bilaterally. Neck: There are no carotid bruits noted bilaterally.  NEUROLOGICAL:    05/05/2022   10:00 AM  Montreal Cognitive Assessment   Visuospatial/ Executive (0/5) 0  Naming (0/3) 3  Attention: Read list of digits (0/2) 2  Attention: Read list of letters (0/1) 1  Attention: Serial 7 subtraction starting at 100 (0/3) 0  Language: Repeat phrase (0/2) 1  Language : Fluency (0/1) 0  Abstraction (0/2) 0  Delayed Recall (0/5) 0  Orientation (0/6) 4  Total 11  Adjusted Score (based on education) 12        View : No data to display.           Orientation:  Alert and oriented to person, place and time. No aphasia or dysarthria. Fund of knowledge is appropriate. Recent and remote memory impaired Attention and concentration are normal.  Able to name objects and repeat phrases. Delayed recall 0/5  Cranial nerves: There is good facial symmetry. Extraocular muscles are intact and visual fields are full to confrontational  testing. Speech is fluent and clear. Soft palate rises symmetrically and there is no tongue deviation. Hearing is intact to conversational tone. Tone: Tone is good throughout. Sensation: Sensation is intact to light touch and pinprick throughout. Vibration is intact at the bilateral big toe.There is no extinction with double simultaneous stimulation. There is no sensory dermatomal level identified. Coordination: The patient has no difficulty with RAM's or FNF bilaterally. Normal finger to nose  Motor: Strength is 5/5 in the bilateral upper and lower extremities. There is no pronator drift. There are no fasciculations noted. DTR's: Deep tendon reflexes are 2/4 at the bilateral biceps, triceps, brachioradialis, patella and achilles.  Plantar responses are downgoing bilaterally. Gait and Station: The patient is able to ambulate without difficulty.The patient is able to heel toe walk without any difficulty.The patient is able to ambulate in a tandem fashion. The patient is able to stand in the Romberg position.     Thank you for allowing [place] the opportunity to participate in the care of this nice patient. Please do not hesitate to contact us for any questions or concerns.   Total time spent on today's visit was 47 minutes dedicated to this patient today, preparing to see patient, examining the patient, ordering tests and/or medications and counseling the patient, documenting clinical information in the EHR or other health record, independently interpreting results and communicating results to the patient/family, discussing treatment and goals, answering patient's questions and coordinating care.  Cc:  Pcp, No  Sharene Butters 05/07/2022 11:46 AM

## 2022-05-05 NOTE — Patient Instructions (Addendum)
It was a pleasure to see you today at our office.   Recommendations:  MRI of the brain, the radiology office will call you to arrange you appointment Follow up in 1 month to move forward with plans   Whom to call:  Memory  decline, memory medications: Call out office [phone]   For psychiatric meds, mood meds: Please have your primary care physician manage these medications.     For assessment of decision of mental capacity and competency:  Call Dr. Anthoney Harada, geriatric psychiatrist at [phone]  For guidance in geriatric dementia issues please call [facility] [phone]  For guidance regarding [facility] and if placement were needed at the facility, contact Arnell Asal, Social Worker tel: [phone]  If you have any severe symptoms of a stroke, or other severe issues such as confusion,severe chills or fever, etc call 911 or go to the ER as you may need to be evaluate further   Feel free to visit Facebook page " [email or web address]" for tips of how to care for people with memory problems.       RECOMMENDATIONS FOR ALL PATIENTS WITH MEMORY PROBLEMS: 1. Continue to exercise (Recommend 30 minutes of walking everyday, or 3 hours every week) 2. Increase social interactions - continue going to [place] and enjoy social gatherings with friends and family 3. Eat healthy, avoid fried foods and eat more fruits and vegetables 4. Maintain adequate blood pressure, blood sugar, and blood cholesterol level. Reducing the risk of stroke and cardiovascular disease also helps promoting better memory. 5. Avoid stressful situations. Live a simple life and avoid aggravations. Organize your time and prepare for the next day in anticipation. 6. Sleep well, avoid any interruptions of sleep and avoid any distractions in the bedroom that may interfere with adequate sleep quality 7. Avoid sugar, avoid sweets as there is a strong link between excessive sugar intake,  diabetes, and cognitive impairment We discussed the Mediterranean diet, which has been shown to help patients reduce the risk of progressive memory disorders and reduces cardiovascular risk. This includes eating fish, eat fruits and green leafy vegetables, nuts like almonds and hazelnuts, walnuts, and also use olive oil. Avoid fast foods and fried foods as much as possible. Avoid sweets and sugar as sugar use has been linked to worsening of memory function.  There is always a concern of gradual progression of memory problems. If this is the case, then we may need to adjust level of care according to patient needs. Support, both to the patient and caregiver, should then be put into place.       FALL PRECAUTIONS: Be cautious when walking. Scan the area for obstacles that may increase the risk of trips and falls. When getting up in the mornings, sit up at the edge of the bed for a few minutes before getting out of bed. Consider elevating the bed at the head end to avoid drop of blood pressure when getting up. Walk always in a well-lit room (use night lights in the walls). Avoid area rugs or power cords from appliances in the middle of the walkways. Use a walker or a cane if necessary and consider physical therapy for balance exercise. Get your eyesight checked regularly.  FINANCIAL OVERSIGHT: Supervision, especially oversight when making financial decisions or transactions is also recommended.  HOME SAFETY: Consider the safety of the kitchen when operating appliances like stoves, microwave oven, and blender. Consider having supervision and share cooking responsibilities until no longer able to  participate in those. Accidents with firearms and other hazards in the house should be identified and addressed as well.   ABILITY TO BE LEFT ALONE: If patient is unable to contact 911 operator, consider using LifeLine, or when the need is there, arrange for someone to stay with patients. Smoking is a fire hazard,  consider supervision or cessation. Risk of wandering should be assessed by caregiver and if detected at any point, supervision and safe proof recommendations should be instituted.  MEDICATION SUPERVISION: Inability to self-administer medication needs to be constantly addressed. Implement a mechanism to ensure safe administration of the medications.      Mediterranean Diet A Mediterranean diet refers to food and lifestyle choices that are based on the traditions of countries located on the [employer]. This way of eating has been shown to help prevent certain conditions and improve outcomes for people who have chronic diseases, like kidney disease and heart disease. What are tips for following this plan? Lifestyle  Cook and eat meals together with your family, when possible. Drink enough fluid to keep your urine clear or pale yellow. Be physically active every day. This includes: Aerobic exercise like running or swimming. Leisure activities like gardening, walking, or housework. Get 7-8 hours of sleep each night. If recommended by your health care provider, drink red wine in moderation. This means 1 glass a day for nonpregnant women and 2 glasses a day for men. A glass of wine equals 5 oz (150 mL). Reading food labels  Check the serving size of packaged foods. For foods such as rice and pasta, the serving size refers to the amount of cooked product, not dry. Check the total fat in packaged foods. Avoid foods that have saturated fat or trans fats. Check the ingredients list for added sugars, such as corn syrup. Shopping  At the grocery store, buy most of your food from the areas near the walls of the store. This includes: Fresh fruits and vegetables (produce). Grains, beans, nuts, and seeds. Some of these may be available in unpackaged forms or large amounts (in bulk). Fresh seafood. Poultry and eggs. Low-fat dairy products. Buy whole ingredients instead of prepackaged foods. Buy  fresh fruits and vegetables in-season from local farmers markets. Buy frozen fruits and vegetables in resealable bags. If you do not have access to quality fresh seafood, buy precooked frozen shrimp or canned fish, such as tuna, salmon, or sardines. Buy small amounts of raw or cooked vegetables, salads, or olives from the deli or salad bar at your store. Stock your pantry so you always have certain foods on hand, such as olive oil, canned tuna, canned tomatoes, rice, pasta, and beans. Cooking  Cook foods with extra-virgin olive oil instead of using butter or other vegetable oils. Have meat as a side dish, and have vegetables or grains as your main dish. This means having meat in small portions or adding small amounts of meat to foods like pasta or stew. Use beans or vegetables instead of meat in common dishes like chili or lasagna. Experiment with different cooking methods. Try roasting or broiling vegetables instead of steaming or sauteing them. Add frozen vegetables to soups, stews, pasta, or rice. Add nuts or seeds for added healthy fat at each meal. You can add these to yogurt, salads, or vegetable dishes. Marinate fish or vegetables using olive oil, lemon juice, garlic, and fresh herbs. Meal planning  Plan to eat 1 vegetarian meal one day each week. Try to work up to 2 vegetarian  meals, if possible. Eat seafood 2 or more times a week. Have healthy snacks readily available, such as: Vegetable sticks with hummus. Greek yogurt. Fruit and nut trail mix. Eat balanced meals throughout the week. This includes: Fruit: 2-3 servings a day Vegetables: 4-5 servings a day Low-fat dairy: 2 servings a day Fish, poultry, or lean meat: 1 serving a day Beans and legumes: 2 or more servings a week Nuts and seeds: 1-2 servings a day Whole grains: 6-8 servings a day Extra-virgin olive oil: 3-4 servings a day Limit red meat and sweets to only a few servings a month What are my food  choices? Mediterranean diet Recommended Grains: Whole-grain pasta. Brown rice. Bulgar wheat. Polenta. Couscous. Whole-wheat bread. [place]. Vegetables: Artichokes. Beets. Broccoli. Cabbage. Carrots. Eggplant. Green beans. Chard. Kale. Spinach. Onions. Leeks. Peas. Squash. Tomatoes. Peppers. Radishes. Fruits: Apples. Apricots. Avocado. Berries. Bananas. Cherries. Dates. Figs. Grapes. Lemons. Melon. Oranges. Peaches. Plums. Pomegranate. Meats and other protein foods: Beans. Almonds. Sunflower seeds. Pine nuts. Peanuts. [place]. Salmon. Scallops. Shrimp. [place]. Tilapia. Clams. Oysters. Eggs. Dairy: Low-fat milk. Cheese. Greek yogurt. Beverages: Water. Red wine. Herbal tea. Fats and oils: Extra virgin olive oil. Avocado oil. Grape seed oil. Sweets and desserts: [place] yogurt with honey. Baked apples. Poached pears. Trail mix. Seasoning and other foods: Basil. Cilantro. Coriander. Cumin. Mint. Parsley. Sage. Rosemary. Tarragon. Garlic. Oregano. Thyme. Pepper. Balsalmic vinegar. Tahini. Hummus. Tomato sauce. Olives. Mushrooms. Limit these Grains: Prepackaged pasta or rice dishes. Prepackaged cereal with added sugar. Vegetables: Deep fried potatoes (french fries). Fruits: Fruit canned in syrup. Meats and other protein foods: Beef. Pork. Lamb. Poultry with skin. Hot dogs. Berniece Salines. Dairy: Ice cream. Sour cream. Whole milk. Beverages: Juice. Sugar-sweetened soft drinks. Beer. Liquor and spirits. Fats and oils: Butter. Canola oil. Vegetable oil. Beef fat (tallow). Lard. Sweets and desserts: Cookies. Cakes. Pies. Candy. Seasoning and other foods: Mayonnaise. Premade sauces and marinades. The items listed may not be a complete list. Talk with your dietitian about what dietary choices are right for you. Summary The Mediterranean diet includes both food and lifestyle choices. Eat a variety of fresh fruits and vegetables, beans, nuts, seeds, and whole grains. Limit the amount of red meat and sweets that  you eat. Talk with your health care provider about whether it is safe for you to drink red wine in moderation. This means 1 glass a day for nonpregnant women and 2 glasses a day for men. A glass of wine equals 5 oz (150 mL). This information is not intended to replace advice given to you by your health care provider. Make sure you discuss any questions you have with your health care provider. Document Released: 07/27/2016 Document Revised: 08/29/2016 Document Reviewed: 07/27/2016 Elsevier Interactive Patient Education  2017 [employer].

## 2022-05-07 DIAGNOSIS — F0280 Dementia in other diseases classified elsewhere without behavioral disturbance: Secondary | ICD-10-CM | POA: Insufficient documentation

## 2022-05-17 DIAGNOSIS — N182 Chronic kidney disease, stage 2 (mild): Secondary | ICD-10-CM | POA: Diagnosis not present

## 2022-05-17 DIAGNOSIS — I517 Cardiomegaly: Secondary | ICD-10-CM | POA: Diagnosis not present

## 2022-05-17 DIAGNOSIS — D649 Anemia, unspecified: Secondary | ICD-10-CM | POA: Diagnosis not present

## 2022-05-17 DIAGNOSIS — E7800 Pure hypercholesterolemia, unspecified: Secondary | ICD-10-CM | POA: Diagnosis not present

## 2022-05-17 DIAGNOSIS — R600 Localized edema: Secondary | ICD-10-CM | POA: Diagnosis not present

## 2022-05-17 DIAGNOSIS — R413 Other amnesia: Secondary | ICD-10-CM | POA: Diagnosis not present

## 2022-05-23 ENCOUNTER — Ambulatory Visit
Admission: RE | Admit: 2022-05-23 | Discharge: 2022-05-23 | Disposition: A | Discharge status: Home / Self Care | Payer: PPO | Source: Ambulatory Visit | Attending: Physician Assistant | Admitting: Physician Assistant

## 2022-05-23 DIAGNOSIS — R413 Other amnesia: Secondary | ICD-10-CM

## 2022-05-23 DIAGNOSIS — I639 Cerebral infarction, unspecified: Secondary | ICD-10-CM | POA: Diagnosis not present

## 2022-05-24 ENCOUNTER — Ambulatory Visit: Payer: PPO | Admitting: Orthopedic Surgery

## 2022-05-29 ENCOUNTER — Encounter: Payer: Self-pay | Admitting: Nurse Practitioner

## 2022-05-29 ENCOUNTER — Ambulatory Visit (INDEPENDENT_AMBULATORY_CARE_PROVIDER_SITE_OTHER): Payer: PPO | Admitting: Nurse Practitioner

## 2022-05-29 VITALS — BP 130/88 | HR 69 | Temp 96.8°F | Resp 16 | Ht 65.0 in | Wt 128.4 lb

## 2022-05-29 DIAGNOSIS — D649 Anemia, unspecified: Secondary | ICD-10-CM | POA: Diagnosis not present

## 2022-05-29 DIAGNOSIS — R413 Other amnesia: Secondary | ICD-10-CM

## 2022-05-29 DIAGNOSIS — H9190 Unspecified hearing loss, unspecified ear: Secondary | ICD-10-CM

## 2022-05-29 DIAGNOSIS — H6122 Impacted cerumen, left ear: Secondary | ICD-10-CM

## 2022-05-29 DIAGNOSIS — E782 Mixed hyperlipidemia: Secondary | ICD-10-CM | POA: Diagnosis not present

## 2022-05-29 LAB — CBC WITH DIFFERENTIAL/PLATELET
Absolute Monocytes: 617 cells/uL (ref 200–950)
Basophils Absolute: 59 cells/uL (ref 0–200)
Basophils Relative: 1.2 %
Eosinophils Absolute: 157 cells/uL (ref 15–500)
Eosinophils Relative: 3.2 %
HCT: 35 % (ref 35.0–45.0)
Hemoglobin: 11.7 g/dL (ref 11.7–15.5)
Lymphs Abs: 1284 cells/uL (ref 850–3900)
MCH: 30.5 pg (ref 27.0–33.0)
MCHC: 33.4 g/dL (ref 32.0–36.0)
MCV: 91.1 fL (ref 80.0–100.0)
MPV: 10.1 fL (ref 7.5–12.5)
Monocytes Relative: 12.6 %
Neutro Abs: 2783 cells/uL (ref 1500–7800)
Neutrophils Relative %: 56.8 %
Platelets: 192 10*3/uL (ref 140–400)
RBC: 3.84 10*6/uL (ref 3.80–5.10)
RDW: 13 % (ref 11.0–15.0)
Total Lymphocyte: 26.2 %
WBC: 4.9 10*3/uL (ref 3.8–10.8)

## 2022-05-29 LAB — COMPLETE METABOLIC PANEL WITH GFR
AG Ratio: 1.8 (calc) (ref 1.0–2.5)
ALT: 17 U/L (ref 6–29)
AST: 23 U/L (ref 10–35)
Albumin: 4 g/dL (ref 3.6–5.1)
Alkaline phosphatase (APISO): 85 U/L (ref 37–153)
BUN/Creatinine Ratio: 25 (calc) — ABNORMAL HIGH (ref 6–22)
BUN: 28 mg/dL — ABNORMAL HIGH (ref 7–25)
CO2: 27 mmol/L (ref 20–32)
Calcium: 9.1 mg/dL (ref 8.6–10.4)
Chloride: 103 mmol/L (ref 98–110)
Creat: 1.13 mg/dL — ABNORMAL HIGH (ref 0.60–0.95)
Globulin: 2.2 g/dL (calc) (ref 1.9–3.7)
Glucose, Bld: 90 mg/dL (ref 65–139)
Potassium: 4.9 mmol/L (ref 3.5–5.3)
Sodium: 137 mmol/L (ref 135–146)
Total Bilirubin: 0.5 mg/dL (ref 0.2–1.2)
Total Protein: 6.2 g/dL (ref 6.1–8.1)
eGFR: 48 mL/min/{1.73_m2} — ABNORMAL LOW (ref >=60)

## 2022-05-29 LAB — LIPID PANEL
Cholesterol: 174 mg/dL (ref <200)
HDL: 82 mg/dL (ref >=50)
LDL Cholesterol (Calc): 70 mg/dL (calc)
Non-HDL Cholesterol (Calc): 92 mg/dL (calc) (ref <130)
Total CHOL/HDL Ratio: 2.1 (calc) (ref <5.0)
Triglycerides: 135 mg/dL (ref <150)

## 2022-05-29 NOTE — Progress Notes (Signed)
Careteam: Patient Care Team: Pcp, No as PCP - General Corky Crafts, MD as PCP - Cardiology (Cardiology)  PLACE OF SERVICE:  [facility]  Advanced Directive information Does Patient Have a Medical Advance Directive?: No, Would patient like information on creating a medical advance directive?: No - Patient declined  No Known Allergies  Chief Complaint  Patient presents with   Establish Care    New Patient.      HPI: Patient is a 87 y.o. female to establish care.  Reports she prefers female providers.  She is here with her granddaughter who lives with her.   She is not on any prescription medication  She is on calcium, multivitamin and previously on ASA but another provider told her to stop ASA.   She has some arthritis in her hands but does not require medication  Hx of htn and hyperlipidemia- manages with diet.   She went to neurologist due to memory loss.  Granddaughter reports she does very well from the day to day.  Has her routine that she sticks to and highly functional   Review of Systems:  Review of Systems  Constitutional:  Negative for chills, fever and weight loss.  HENT:  Negative for tinnitus.   Respiratory:  Negative for cough, sputum production and shortness of breath.   Cardiovascular:  Negative for chest pain, palpitations and leg swelling.  Gastrointestinal:  Negative for abdominal pain, constipation, diarrhea and heartburn.  Genitourinary:  Negative for dysuria, frequency and urgency.  Musculoskeletal:  Negative for back pain, falls, joint pain and myalgias.  Skin: Negative.   Neurological:  Negative for dizziness and headaches.  Psychiatric/Behavioral:  Negative for depression and memory loss. The patient does not have insomnia.     Past Medical History:  Diagnosis Date   Arthritis    CHF (congestive heart failure) (HCC)    Fibrocystic breast disease    Frequent headaches    History of squamous cell carcinoma 12/07/2010   left  calf inferior/EDC   HTN (hypertension)    Hyperlipidemia    Mild cardiomegaly    Squamous cell carcinoma of skin 12/07/2010   left calf inferior   Past Surgical History:  Procedure Laterality Date   BREAST BIOPSY Right 1990   neg   Social History:   reports that she has quit smoking. Her smoking use included cigarettes. She has never used smokeless tobacco. She reports that she does not drink alcohol and does not use drugs.  Family History  Problem Relation Age of Onset   Pancreatic cancer Mother    Coronary artery disease Father    Breast cancer Maternal Aunt     Medications: Patient's Medications  New Prescriptions   No medications on file  Previous Medications   CALCIUM CARBONATE (CALCIUM 500 PO)    Take 1 capsule by mouth daily.   MULTIPLE VITAMIN (MULTIVITAMIN) TABLET    Take 1 tablet by mouth daily.  Modified Medications   No medications on file  Discontinued Medications   ASPIRIN 81 MG EC TABLET    Take by mouth.   BENZONATATE (TESSALON) 200 MG CAPSULE    Take 1 capsule (200 mg total) by mouth 3 (three) times daily as needed for cough.   CALCIUM PO    Take by mouth. 500 MG PER PATIENT   CIPROFLOXACIN (CIPRO) 250 MG TABLET    Take 250 mg by mouth 2 (two) times daily.   DOXYCYCLINE (VIBRAMYCIN) 100 MG CAPSULE    Take 1  capsule (100 mg total) by mouth 2 (two) times daily.   FUROSEMIDE (LASIX) 20 MG TABLET    Take 1 tablet (20 mg total) by mouth daily as needed.   GUAIFENESIN (MUCINEX) 600 MG 12 HR TABLET    Take 1 tablet (600 mg total) by mouth 2 (two) times daily as needed.    Physical Exam:  Vitals:   05/29/22 1018  BP: 130/88  Pulse: 69  Resp: 16  Temp: (!) 96.8 F (36 C)  SpO2: 98%  Weight: 128 lb 6.4 oz (58.2 kg)  Height: $Remove'5\' 5"'lvRSDJZ$  (1.651 m)   Body mass index is 21.37 kg/m. Wt Readings from Last 3 Encounters:  05/29/22 128 lb 6.4 oz (58.2 kg)  05/05/22 126 lb (57.2 kg)  04/26/22 128 lb 9.6 oz (58.3 kg)    Physical Exam Constitutional:      General:  She is not in acute distress.    Appearance: She is well-developed. She is not diaphoretic.  HENT:     Head: Normocephalic and atraumatic.     Left Ear: There is impacted cerumen.     Mouth/Throat:     Pharynx: No oropharyngeal exudate.  Eyes:     Conjunctiva/sclera: Conjunctivae normal.     Pupils: Pupils are equal, round, and reactive to light.  Cardiovascular:     Rate and Rhythm: Normal rate and regular rhythm.     Heart sounds: Normal heart sounds.  Pulmonary:     Effort: Pulmonary effort is normal.     Breath sounds: Normal breath sounds.  Abdominal:     General: Bowel sounds are normal.     Palpations: Abdomen is soft.  Musculoskeletal:     Cervical back: Normal range of motion and neck supple.     Right lower leg: No edema.     Left lower leg: No edema.  Skin:    General: Skin is warm and dry.  Neurological:     Mental Status: She is alert.  Psychiatric:        Mood and Affect: Mood normal.     Labs reviewed: Basic Metabolic Panel: Recent Labs    02/13/22 1217  NA 141  K 4.3  CL 105  CO2 28  GLUCOSE 99  BUN 20  CREATININE 1.03*  CALCIUM 10.0   Liver Function Tests: Recent Labs    02/13/22 1217  AST 28  ALT 22  ALKPHOS 82  BILITOT 0.8  PROT 6.5  ALBUMIN 4.0   No results for input(s): "LIPASE", "AMYLASE" in the last 8760 hours. No results for input(s): "AMMONIA" in the last 8760 hours. CBC: Recent Labs    02/13/22 1217  WBC 4.6  NEUTROABS 2.7  HGB 11.8*  HCT 35.8*  MCV 94.0  PLT 184   Lipid Panel: No results for input(s): "CHOL", "HDL", "LDLCALC", "TRIG", "CHOLHDL", "LDLDIRECT" in the last 8760 hours. TSH: No results for input(s): "TSH" in the last 8760 hours. A1C: No results found for: "HGBA1C"   Assessment/Plan 1. Mixed hyperlipidemia -will follow up lab today. Continues on dietary modifications - Lipid panel - CMP with eGFR([facility])  2. Anemia, unspecified type -noted on recent lab.  - CBC with Differential/Platelet  3.  Memory loss -recent MRI, followed by neurology. Vascular changes noted on MRI, encouraged to take ASA 81 mg daily   4. Impacted cerumen of left ear Completed lavage to remove. Pt tolerated well.   5. Hearing loss, unspecified hearing loss type, unspecified laterality - Ambulatory referral to Audiology  Return in  about 3 months (around 08/29/2022) for routine follow up. Carlos American. [place] [phone]

## 2022-05-29 NOTE — Patient Instructions (Signed)
To schedule AWV- can do this this week on Tuesday or Thursday

## 2022-05-30 ENCOUNTER — Encounter: Payer: PPO | Admitting: Nurse Practitioner

## 2022-05-30 ENCOUNTER — Ambulatory Visit (INDEPENDENT_AMBULATORY_CARE_PROVIDER_SITE_OTHER): Payer: PPO | Admitting: Nurse Practitioner

## 2022-05-30 ENCOUNTER — Telehealth: Payer: Self-pay

## 2022-05-30 ENCOUNTER — Encounter: Payer: Self-pay | Admitting: Nurse Practitioner

## 2022-05-30 DIAGNOSIS — Z0000 Encounter for general adult medical examination without abnormal findings: Secondary | ICD-10-CM | POA: Diagnosis not present

## 2022-05-30 DIAGNOSIS — E2839 Other primary ovarian failure: Secondary | ICD-10-CM

## 2022-05-30 NOTE — Telephone Encounter (Addendum)
After 4 unsuccessful attempts to reach patient by phone (left a voicemail each time)  Final message instructed patient to return call to reschedule annual wellness visit to this afternoon or for another day (preferably on a Tuesday or Thursday)

## 2022-05-30 NOTE — Telephone Encounter (Signed)
Ms. charly, hunton are scheduled for a virtual visit with your provider today.    Just as we do with appointments in the office, we must obtain your consent to participate.  Your consent will be active for this visit and any virtual visit you may have with one of our providers in the next 365 days.    If you have a MyChart account, I can also send a copy of this consent to you electronically.  All virtual visits are billed to your insurance company just like a traditional visit in the office.  As this is a virtual visit, video technology does not allow for your provider to perform a traditional examination.  This may limit your provider's ability to fully assess your condition.  If your provider identifies any concerns that need to be evaluated in person or the need to arrange testing such as labs, EKG, etc, we will make arrangements to do so.    Although advances in technology are sophisticated, we cannot ensure that it will always work on either your end or our end.  If the connection with a video visit is poor, we may have to switch to a telephone visit.  With either a video or telephone visit, we are not always able to ensure that we have a secure connection.   I need to obtain your verbal consent now.   Are you willing to proceed with your visit today?   Alison Brewer has provided verbal consent on 05/30/2022 for a virtual visit (video or telephone).   Leigh Aurora [place], [place] 05/30/2022  11:24 AM

## 2022-05-30 NOTE — Progress Notes (Signed)
Subjective:   Alison Brewer is a 87 y.o. female who presents for Medicare Annual (Subsequent) preventive examination.  Review of Systems     Cardiac Risk Factors include: advanced age (>50mn, >>32women)     Objective:    There were no vitals filed for this visit. There is no height or weight on file to calculate BMI.     05/30/2022   11:14 AM 05/30/2022    8:03 AM 05/29/2022   10:36 AM 02/13/2022   12:16 PM  Advanced Directives  Does Patient Have a Medical Advance Directive? No No No Yes  Type of Advance Directive    Living will  Does patient want to make changes to medical advance directive?    No - Patient declined  Would patient like information on creating a medical advance directive? No - Patient declined No - Patient declined No - Patient declined No - Patient declined    Current Medications (verified) Outpatient Encounter Medications as of 05/30/2022  Medication Sig   Calcium Carbonate (CALCIUM 500 PO) Take 1 capsule by mouth daily.   Multiple Vitamin (MULTIVITAMIN) tablet Take 1 tablet by mouth daily.   sodium chloride (MURO 128) 2 % ophthalmic solution Place 1 drop into both eyes daily.   No facility-administered encounter medications on file as of 05/30/2022.    Allergies (verified) Patient has no known allergies.   History: Past Medical History:  Diagnosis Date   Arthritis    Fibrocystic breast disease    Frequent headaches    History of squamous cell carcinoma 12/07/2010   left calf inferior/EDC   HTN (hypertension)    Hyperlipidemia    Mild cardiomegaly    Past Surgical History:  Procedure Laterality Date   BREAST BIOPSY Right 1990   neg   Family History  Problem Relation Age of Onset   Pancreatic cancer Mother    Coronary artery disease Father    Cancer Brother    Breast cancer Maternal Aunt    Social History   Socioeconomic History   Marital status: Widowed    Spouse name: Not on file   Number of children: Not on file   Years of  education: 12   Highest education level: Not on file  Occupational History   Not on file  Tobacco Use   Smoking status: Former    Packs/day: 1.50    Years: 18.00    Total pack years: 27.00    Types: Cigarettes    Quit date: 113   Years since quitting: 43.4   Smokeless tobacco: Never  Vaping Use   Vaping Use: Never used  Substance and Sexual Activity   Alcohol use: Never   Drug use: Never   Sexual activity: Not Currently    Birth control/protection: None  Other Topics Concern   Not on file  Social History Narrative   Right handed   Drink no coffe   One story home   Social Determinants of Health   Financial Resource Strain: Not on file  Food Insecurity: Not on file  Transportation Needs: Not on file  Physical Activity: Not on file  Stress: Not on file  Social Connections: Not on file    Tobacco Counseling Counseling given: Not Answered   Clinical Intake:  Pre-visit preparation completed: Yes  Pain : No/denies pain     BMI - recorded: 21 Nutritional Risks: None Diabetes: No  How often do you need to have someone help you when you read instructions, pamphlets, or  other written materials from your doctor or pharmacy?: 1 - Never  Diabetic?no         Activities of Daily Living    05/30/2022   11:29 AM  In your present state of health, do you have any difficulty performing the following activities:  Hearing? 0  Vision? 1  Difficulty concentrating or making decisions? 1  Walking or climbing stairs? 0  Dressing or bathing? 0  Doing errands, shopping? 1  Comment does not drive  Preparing Food and eating ? N  Using the Toilet? N  In the past six months, have you accidently leaked urine? Y  Do you have problems with loss of bowel control? N  Managing your Medications? N  Managing your Finances? Y  Comment does not manage finances  Housekeeping or managing your Housekeeping? N    Patient Care Team: [name], NP as PCP - General  (Geriatric Medicine) Jettie Booze, MD as PCP - Cardiology (Cardiology)  Indicate any recent Medical Services you may have received from other than [facility] providers in the past year (date may be approximate).     Assessment:   This is a routine wellness examination for Alison Brewer.  Hearing/Vision screen Hearing Screening - Comments:: No hearing issues  Vision Screening - Comments:: Last eye exam unknown, and name of eye doctor unknown  Dietary issues and exercise activities discussed: Current Exercise Habits: The patient does not participate in regular exercise at present   Goals Addressed   None    Depression Screen    05/30/2022   11:02 AM 05/30/2022    8:02 AM  PHQ 2/9 Scores  PHQ - 2 Score 0 0    Fall Risk    05/30/2022   11:02 AM 05/30/2022    8:02 AM 05/29/2022   10:36 AM  Fall Risk   Falls in the past year? 0 0 0  Number falls in past yr: 0 0 0  Injury with Fall? 0 0 0  Risk for fall due to : No Fall Risks No Fall Risks No Fall Risks  Follow up Falls evaluation completed Falls evaluation completed Falls evaluation completed    [place]:  Any stairs in or around the home? Yes  If so, are there any without handrails? Yes  Home free of loose throw rugs in walkways, pet beds, electrical cords, etc? Yes  Adequate lighting in your home to reduce risk of falls? Yes   ASSISTIVE DEVICES UTILIZED TO PREVENT FALLS:  Life alert? No  Use of a cane, walker or w/c? Yes  Grab bars in the bathroom? Yes  Shower chair or bench in shower? Yes  Elevated toilet seat or a handicapped toilet? No   TIMED UP AND GO:  Was the test performed? No .   Cognitive Function:      05/05/2022   10:00 AM  Montreal Cognitive Assessment   Visuospatial/ Executive (0/5) 0  Naming (0/3) 3  Attention: Read list of digits (0/2) 2  Attention: Read list of letters (0/1) 1  Attention: Serial 7 subtraction starting at 100 (0/3) 0  Language: Repeat phrase  (0/2) 1  Language : Fluency (0/1) 0  Abstraction (0/2) 0  Delayed Recall (0/5) 0  Orientation (0/6) 4  Total 11  Adjusted Score (based on education) 12      05/30/2022   11:14 AM  6CIT Screen  What Year? 0 points  What month? 0 points  What time? 3 points  Count back from 20 4 points  Months in reverse 4 points  Repeat phrase 10 points  Total Score 21 points    Immunizations Immunization History  Administered Date(s) Administered   Influenza Split 09/25/2014   Influenza-Unspecified 09/25/2014, 09/09/2015, 10/04/2017, 06/02/2019   PFIZER Comirnaty(Gray Top)Covid-19 Tri-Sucrose Vaccine 02/17/2020, 03/22/2020   Pneumococcal Conjugate-13 09/09/2015   Pneumococcal Polysaccharide-23 01/01/2017   Zoster Recombinat (Shingrix) 04/06/2020   Zoster, Live 05/26/2010    TDAP status: Due, Education has been provided regarding the importance of this vaccine. Advised may receive this vaccine at local pharmacy or Health Dept. Aware to provide a copy of the vaccination record if obtained from local pharmacy or Health Dept. Verbalized acceptance and understanding.  Flu Vaccine status: Up to date  Pneumococcal vaccine status: Up to date  Covid-19 vaccine status: Information provided on how to obtain vaccines.   Qualifies for Shingles Vaccine? Yes   Zostavax completed Yes   Shingrix Completed?: No.    Education has been provided regarding the importance of this vaccine. Patient has been advised to call insurance company to determine out of pocket expense if they have not yet received this vaccine. Advised may also receive vaccine at local pharmacy or Health Dept. Verbalized acceptance and understanding.  Screening Tests Health Maintenance  Topic Date Due   TETANUS/TDAP  Never done   DEXA SCAN  Never done   COVID-19 Vaccine (3 - Pfizer risk series) 04/19/2020   Zoster Vaccines- Shingrix (2 of 2) 06/01/2020   INFLUENZA VACCINE  07/18/2022   Pneumonia Vaccine 24+ Years old  Completed    HPV VACCINES  Aged Out    Health Maintenance  Health Maintenance Due  Topic Date Due   TETANUS/TDAP  Never done   DEXA SCAN  Never done   COVID-19 Vaccine (3 - Pfizer risk series) 04/19/2020   Zoster Vaccines- Shingrix (2 of 2) 06/01/2020    Colorectal cancer screening: No longer required.   Mammogram status: No longer required due to age.  Bone Density status: Ordered today. Pt provided with contact info and advised to call to schedule appt.  Lung Cancer Screening: (Low Dose CT Chest recommended if Age 74-80 years, 30 pack-year currently smoking OR have quit w/in 15years.) does not qualify.   Lung Cancer Screening Referral: na  Additional Screening:  Hepatitis C Screening: does not qualify; Completed na  Vision Screening: Recommended annual ophthalmology exams for early detection of glaucoma and other disorders of the eye. Is the patient up to date with their annual eye exam?  Yes  Who is the provider or what is the name of the office in which the patient attends annual eye exams? unsure If pt is not established with a provider, would they like to be referred to a provider to establish care? Yes .   Dental Screening: Recommended annual dental exams for proper oral hygiene  Community Resource Referral / Chronic Care Management: CRR required this visit?  No   CCM required this visit?  No      Plan:     I have personally reviewed and noted the following in the patient's chart:   Medical and social history Use of alcohol, tobacco or illicit drugs  Current medications and supplements including opioid prescriptions.  Functional ability and status Nutritional status Physical activity Advanced directives List of other physicians Hospitalizations, surgeries, and ER visits in previous 12 months Vitals Screenings to include cognitive, depression, and falls Referrals and appointments  In addition, I have reviewed and discussed with  patient certain preventive protocols,  quality metrics, and best practice recommendations. A written personalized care plan for preventive services as well as general preventive health recommendations were provided to patient.     [name], NP   05/30/2022    Virtual Visit via Telephone Note  I connected with patient 05/30/22 at 11:00 AM EDT by telephone and verified that I am speaking with the correct person using two identifiers.  Location: Patient: home Provider: [facility]    I discussed the limitations, risks, security and privacy concerns of performing an evaluation and management service by telephone and the availability of in person appointments. I also discussed with the patient that there may be a patient responsible charge related to this service. The patient expressed understanding and agreed to proceed.   I discussed the assessment and treatment plan with the patient. The patient was provided an opportunity to ask questions and all were answered. The patient agreed with the plan and demonstrated an understanding of the instructions.   The patient was advised to call back or seek an in-person evaluation if the symptoms worsen or if the condition fails to improve as anticipated.  I provided 20 minutes of non-face-to-face time during this encounter.  Carlos American. Harle Battiest Avs printed and mailed

## 2022-05-30 NOTE — Progress Notes (Signed)
   This service is provided via telemedicine  No vital signs collected/recorded due to the encounter was a telemedicine visit.   Location of patient (ex: home, work):  Home  Patient consents to a telephone visit: Yes, see telephone visit dated 05/30/22  Location of the provider (ex: office, home):  [facility], Office   Name of any referring provider:  N/A  Names of all persons participating in the telemedicine service and their role in the encounter:  S.Chrae B/CMA, Sherrie Mustache, NP, and Patient   Time spent on call:  7 min with medical assistant

## 2022-05-30 NOTE — Patient Instructions (Signed)
Alison Brewer , Thank you for taking time to come for your Medicare Wellness Visit. I appreciate your ongoing commitment to your health goals. Please review the following plan we discussed and let me know if I can assist you in the future.   Screening recommendations/referrals: Colonoscopy aged out Mammogram aged out Bone Density order placed today Recommended yearly ophthalmology/optometry visit for glaucoma screening and checkup Recommended yearly dental visit for hygiene and checkup  Vaccinations: Influenza vaccine up to date Pneumococcal vaccine up to date Tdap vaccine -DUE- recommend to get at your local pharmacy    Shingles vaccine -DUE- recommend to get at your local pharmacy       Advanced directives: recommended to complete  Conditions/risks identified: advance age, worsening memory loss.   Next appointment: yearly   Preventive Care 85 Years and Older, Female Preventive care refers to lifestyle choices and visits with your health care provider that can promote health and wellness. What does preventive care include? A yearly physical exam. This is also called an annual well check. Dental exams once or twice a year. Routine eye exams. Ask your health care provider how often you should have your eyes checked. Personal lifestyle choices, including: Daily care of your teeth and gums. Regular physical activity. Eating a healthy diet. Avoiding tobacco and drug use. Limiting alcohol use. Practicing safe sex. Taking low-dose aspirin every day. Taking vitamin and mineral supplements as recommended by your health care provider. What happens during an annual well check? The services and screenings done by your health care provider during your annual well check will depend on your age, overall health, lifestyle risk factors, and family history of disease. Counseling  Your health care provider may ask you questions about your: Alcohol use. Tobacco use. Drug use. Emotional  well-being. Home and relationship well-being. Sexual activity. Eating habits. History of falls. Memory and ability to understand (cognition). Work and work Statistician. Reproductive health. Screening  You may have the following tests or measurements: Height, weight, and BMI. Blood pressure. Lipid and cholesterol levels. These may be checked every 5 years, or more frequently if you are over 66 years old. Skin check. Lung cancer screening. You may have this screening every year starting at age 27 if you have a 30-pack-year history of smoking and currently smoke or have quit within the past 15 years. Fecal occult blood test (FOBT) of the stool. You may have this test every year starting at age 81. Flexible sigmoidoscopy or colonoscopy. You may have a sigmoidoscopy every 5 years or a colonoscopy every 10 years starting at age 85. Hepatitis C blood test. Hepatitis B blood test. Sexually transmitted disease (STD) testing. Diabetes screening. This is done by checking your blood sugar (glucose) after you have not eaten for a while (fasting). You may have this done every 1-3 years. Bone density scan. This is done to screen for osteoporosis. You may have this done starting at age 85. Mammogram. This may be done every 1-2 years. Talk to your health care provider about how often you should have regular mammograms. Talk with your health care provider about your test results, treatment options, and if necessary, the need for more tests. Vaccines  Your health care provider may recommend certain vaccines, such as: Influenza vaccine. This is recommended every year. Tetanus, diphtheria, and acellular pertussis (Tdap, Td) vaccine. You may need a Td booster every 10 years. Zoster vaccine. You may need this after age 85. Pneumococcal 13-valent conjugate (PCV13) vaccine. One dose is recommended after age  65. Pneumococcal polysaccharide (PPSV23) vaccine. One dose is recommended after age 68. Talk to your  health care provider about which screenings and vaccines you need and how often you need them. This information is not intended to replace advice given to you by your health care provider. Make sure you discuss any questions you have with your health care provider. Document Released: 12/31/2015 Document Revised: 08/23/2016 Document Reviewed: 10/05/2015 Elsevier Interactive Patient Education  2017 [place] Prevention in the Home Falls can cause injuries. They can happen to people of all ages. There are many things you can do to make your home safe and to help prevent falls. What can I do on the outside of my home? Regularly fix the edges of walkways and driveways and fix any cracks. Remove anything that might make you trip as you walk through a door, such as a raised step or threshold. Trim any bushes or trees on the path to your home. Use bright outdoor lighting. Clear any walking paths of anything that might make someone trip, such as rocks or tools. Regularly check to see if handrails are loose or broken. Make sure that both sides of any steps have handrails. Any raised decks and porches should have guardrails on the edges. Have any leaves, snow, or ice cleared regularly. Use sand or salt on walking paths during winter. Clean up any spills in your garage right away. This includes oil or grease spills. What can I do in the bathroom? Use night lights. Install grab bars by the toilet and in the tub and shower. Do not use towel bars as grab bars. Use non-skid mats or decals in the tub or shower. If you need to sit down in the shower, use a plastic, non-slip stool. Keep the floor dry. Clean up any water that spills on the floor as soon as it happens. Remove soap buildup in the tub or shower regularly. Attach bath mats securely with double-sided non-slip rug tape. Do not have throw rugs and other things on the floor that can make you trip. What can I do in the bedroom? Use night  lights. Make sure that you have a light by your bed that is easy to reach. Do not use any sheets or blankets that are too big for your bed. They should not hang down onto the floor. Have a firm chair that has side arms. You can use this for support while you get dressed. Do not have throw rugs and other things on the floor that can make you trip. What can I do in the kitchen? Clean up any spills right away. Avoid walking on wet floors. Keep items that you use a lot in easy-to-reach places. If you need to reach something above you, use a strong step stool that has a grab bar. Keep electrical cords out of the way. Do not use floor polish or wax that makes floors slippery. If you must use wax, use non-skid floor wax. Do not have throw rugs and other things on the floor that can make you trip. What can I do with my stairs? Do not leave any items on the stairs. Make sure that there are handrails on both sides of the stairs and use them. Fix handrails that are broken or loose. Make sure that handrails are as long as the stairways. Check any carpeting to make sure that it is firmly attached to the stairs. Fix any carpet that is loose or worn. Avoid having throw rugs at  the top or bottom of the stairs. If you do have throw rugs, attach them to the floor with carpet tape. Make sure that you have a light switch at the top of the stairs and the bottom of the stairs. If you do not have them, ask someone to add them for you. What else can I do to help prevent falls? Wear shoes that: Do not have high heels. Have rubber bottoms. Are comfortable and fit you well. Are closed at the toe. Do not wear sandals. If you use a stepladder: Make sure that it is fully opened. Do not climb a closed stepladder. Make sure that both sides of the stepladder are locked into place. Ask someone to hold it for you, if possible. Clearly mark and make sure that you can see: Any grab bars or handrails. First and last  steps. Where the edge of each step is. Use tools that help you move around (mobility aids) if they are needed. These include: Canes. Walkers. Scooters. Crutches. Turn on the lights when you go into a dark area. Replace any light bulbs as soon as they burn out. Set up your furniture so you have a clear path. Avoid moving your furniture around. If any of your floors are uneven, fix them. If there are any pets around you, be aware of where they are. Review your medicines with your doctor. Some medicines can make you feel dizzy. This can increase your chance of falling. Ask your doctor what other things that you can do to help prevent falls. This information is not intended to replace advice given to you by your health care provider. Make sure you discuss any questions you have with your health care provider. Document Released: 09/30/2009 Document Revised: 05/11/2016 Document Reviewed: 01/08/2015 Elsevier Interactive Patient Education  2017 [employer].

## 2022-05-30 NOTE — Progress Notes (Signed)
   This service is provided via telemedicine  No vital signs collected/recorded due to the encounter was a telemedicine visit.   Location of patient (ex: home, work):  Home  Patient consents to a telephone visit: Yes, see telephone visit dated 05/30/22   Location of the provider (ex: office, home):  [facility], Office   Name of any referring provider:  N/A  Names of all persons participating in the telemedicine service and their role in the encounter:  S.Chrae B/CMA, Sherrie Mustache, NP, and Patient   Time spent on call:  17 min with medical assistant

## 2022-06-02 ENCOUNTER — Ambulatory Visit: Payer: PPO | Admitting: Physician Assistant

## 2022-06-02 ENCOUNTER — Encounter: Payer: Self-pay | Admitting: Physician Assistant

## 2022-06-02 VITALS — BP 133/65 | HR 98 | Ht 63.0 in | Wt 129.0 lb

## 2022-06-02 DIAGNOSIS — G309 Alzheimer's disease, unspecified: Secondary | ICD-10-CM | POA: Diagnosis not present

## 2022-06-02 DIAGNOSIS — F0150 Vascular dementia without behavioral disturbance: Secondary | ICD-10-CM

## 2022-06-02 DIAGNOSIS — F0280 Dementia in other diseases classified elsewhere without behavioral disturbance: Secondary | ICD-10-CM

## 2022-06-02 MED ORDER — MEMANTINE HCL 5 MG PO TABS: ORAL_TABLET | ORAL | 11 refills | Status: DC

## 2022-06-02 NOTE — Patient Instructions (Addendum)
It was a pleasure to see you today at our office.   Recommendations:  Follow up Jan 5 at 11:30  Start Memantine '5mg'$  tablets.  Take 1 tablet at bedtime for 2 weeks, then 1 tablet twice daily.     Whom to call:  Memory  decline, memory medications: Call our office [phone]   For psychiatric meds, mood meds: Please have your primary care physician manage these medications.   Counseling regarding caregiver distress, including caregiver depression, anxiety and issues regarding community resources, adult day care programs, adult living facilities, or memory care questions:   Feel free to contact [place], Social Worker at [phone]   For assessment of decision of mental capacity and competency:  Call Dr. Anthoney Harada, geriatric psychiatrist at [phone]  For guidance in geriatric dementia issues please call [facility] [phone]  For guidance regarding [facility] and if placement were needed at the facility, contact Arnell Asal, Social Worker tel: [phone]  If you have any severe symptoms of a stroke, or other severe issues such as confusion,severe chills or fever, etc call 911 or go to the ER as you may need to be evaluated further   Feel free to visit Facebook page " [email or web address]" for tips of how to care for people with memory problems.         RECOMMENDATIONS FOR ALL PATIENTS WITH MEMORY PROBLEMS: 1. Continue to exercise (Recommend 30 minutes of walking everyday, or 3 hours every week) 2. Increase social interactions - continue going to [place] and enjoy social gatherings with friends and family 3. Eat healthy, avoid fried foods and eat more fruits and vegetables 4. Maintain adequate blood pressure, blood sugar, and blood cholesterol level. Reducing the risk of stroke and cardiovascular disease also helps promoting better memory. 5. Avoid stressful situations. Live a simple life and avoid aggravations. Organize your time and  prepare for the next day in anticipation. 6. Sleep well, avoid any interruptions of sleep and avoid any distractions in the bedroom that may interfere with adequate sleep quality 7. Avoid sugar, avoid sweets as there is a strong link between excessive sugar intake, diabetes, and cognitive impairment We discussed the Mediterranean diet, which has been shown to help patients reduce the risk of progressive memory disorders and reduces cardiovascular risk. This includes eating fish, eat fruits and green leafy vegetables, nuts like almonds and hazelnuts, walnuts, and also use olive oil. Avoid fast foods and fried foods as much as possible. Avoid sweets and sugar as sugar use has been linked to worsening of memory function.  There is always a concern of gradual progression of memory problems. If this is the case, then we may need to adjust level of care according to patient needs. Support, both to the patient and caregiver, should then be put into place.    The Alzheimer's Association is here all day, every day for people facing Alzheimer's disease through our free 24/7 Helpline: [phone]. The Helpline provides reliable information and support to all those who need assistance, such as individuals living with memory loss, Alzheimer's or other dementia, caregivers, health care professionals and the public.  Our highly trained and knowledgeable staff can help you with: Understanding memory loss, dementia and Alzheimer's  Medications and other treatment options  General information about aging and brain health  Skills to provide quality care and to find the best care from professionals  Legal, financial and living-arrangement decisions Our Helpline also features: Confidential care consultation provided by [facility]  level clinicians who can help with decision-making support, crisis assistance and education on issues families face every day  Help in a caller's preferred language using our translation service  that features more than 200 languages and dialects  Referrals to local community programs, services and ongoing support     FALL PRECAUTIONS: Be cautious when walking. Scan the area for obstacles that may increase the risk of trips and falls. When getting up in the mornings, sit up at the edge of the bed for a few minutes before getting out of bed. Consider elevating the bed at the head end to avoid drop of blood pressure when getting up. Walk always in a well-lit room (use night lights in the walls). Avoid area rugs or power cords from appliances in the middle of the walkways. Use a walker or a cane if necessary and consider physical therapy for balance exercise. Get your eyesight checked regularly.  FINANCIAL OVERSIGHT: Supervision, especially oversight when making financial decisions or transactions is also recommended.  HOME SAFETY: Consider the safety of the kitchen when operating appliances like stoves, microwave oven, and blender. Consider having supervision and share cooking responsibilities until no longer able to participate in those. Accidents with firearms and other hazards in the house should be identified and addressed as well.   ABILITY TO BE LEFT ALONE: If patient is unable to contact 911 operator, consider using LifeLine, or when the need is there, arrange for someone to stay with patients. Smoking is a fire hazard, consider supervision or cessation. Risk of wandering should be assessed by caregiver and if detected at any point, supervision and safe proof recommendations should be instituted.  MEDICATION SUPERVISION: Inability to self-administer medication needs to be constantly addressed. Implement a mechanism to ensure safe administration of the medications.   DRIVING: Regarding driving, in patients with progressive memory problems, driving will be impaired. We advise to have someone else do the driving if trouble finding directions or if minor accidents are reported. Independent  driving assessment is available to determine safety of driving.   If you are interested in the driving assessment, you can contact the following:  [employer] in [place]  [place] [phone] or [phone]      [place] refers to food and lifestyle choices that are based on the traditions of countries located on the [employer]. This way of eating has been shown to help prevent certain conditions and improve outcomes for people who have chronic diseases, like kidney disease and heart disease. What are tips for following this plan? Lifestyle  Cook and eat meals together with your family, when possible. Drink enough fluid to keep your urine clear or pale yellow. Be physically active every day. This includes: Aerobic exercise like running or swimming. Leisure activities like gardening, walking, or housework. Get 7-8 hours of sleep each night. If recommended by your health care provider, drink red wine in moderation. This means 1 glass a day for nonpregnant women and 2 glasses a day for men. A glass of wine equals 5 oz (150 mL). Reading food labels  Check the serving size of packaged foods. For foods such as rice and pasta, the serving size refers to the amount of cooked product, not dry. Check the total fat in packaged foods. Avoid foods that have saturated fat or trans fats. Check the ingredients list for added sugars, such as corn syrup. Shopping  At  the grocery store, buy most of your food from the areas near the walls of the store. This includes: Fresh fruits and vegetables (produce). Grains, beans, nuts, and seeds. Some of these may be available in unpackaged forms or large amounts (in bulk). Fresh seafood. Poultry and eggs. Low-fat dairy products. Buy whole ingredients instead of prepackaged foods. Buy fresh fruits and  vegetables in-season from local farmers markets. Buy frozen fruits and vegetables in resealable bags. If you do not have access to quality fresh seafood, buy precooked frozen shrimp or canned fish, such as tuna, salmon, or sardines. Buy small amounts of raw or cooked vegetables, salads, or olives from the deli or salad bar at your store. Stock your pantry so you always have certain foods on hand, such as olive oil, canned tuna, canned tomatoes, rice, pasta, and beans. Cooking  Cook foods with extra-virgin olive oil instead of using butter or other vegetable oils. Have meat as a side dish, and have vegetables or grains as your main dish. This means having meat in small portions or adding small amounts of meat to foods like pasta or stew. Use beans or vegetables instead of meat in common dishes like chili or lasagna. Experiment with different cooking methods. Try roasting or broiling vegetables instead of steaming or sauteing them. Add frozen vegetables to soups, stews, pasta, or rice. Add nuts or seeds for added healthy fat at each meal. You can add these to yogurt, salads, or vegetable dishes. Marinate fish or vegetables using olive oil, lemon juice, garlic, and fresh herbs. Meal planning  Plan to eat 1 vegetarian meal one day each week. Try to work up to 2 vegetarian meals, if possible. Eat seafood 2 or more times a week. Have healthy snacks readily available, such as: Vegetable sticks with hummus. Greek yogurt. Fruit and nut trail mix. Eat balanced meals throughout the week. This includes: Fruit: 2-3 servings a day Vegetables: 4-5 servings a day Low-fat dairy: 2 servings a day Fish, poultry, or lean meat: 1 serving a day Beans and legumes: 2 or more servings a week Nuts and seeds: 1-2 servings a day Whole grains: 6-8 servings a day Extra-virgin olive oil: 3-4 servings a day Limit red meat and sweets to only a few servings a month What are my food choices? Mediterranean  diet Recommended Grains: Whole-grain pasta. Brown rice. Bulgar wheat. Polenta. Couscous. Whole-wheat bread. [place]. Vegetables: Artichokes. Beets. Broccoli. Cabbage. Carrots. Eggplant. Green beans. Chard. Kale. Spinach. Onions. Leeks. Peas. Squash. Tomatoes. Peppers. Radishes. Fruits: Apples. Apricots. Avocado. Berries. Bananas. Cherries. Dates. Figs. Grapes. Lemons. Melon. Oranges. Peaches. Plums. Pomegranate. Meats and other protein foods: Beans. Almonds. Sunflower seeds. Pine nuts. Peanuts. [place]. Salmon. Scallops. Shrimp. [place]. Tilapia. Clams. Oysters. Eggs. Dairy: Low-fat milk. Cheese. Greek yogurt. Beverages: Water. Red wine. Herbal tea. Fats and oils: Extra virgin olive oil. Avocado oil. Grape seed oil. Sweets and desserts: [place] yogurt with honey. Baked apples. Poached pears. Trail mix. Seasoning and other foods: Basil. Cilantro. Coriander. Cumin. Mint. Parsley. Sage. Rosemary. Tarragon. Garlic. Oregano. Thyme. Pepper. Balsalmic vinegar. Tahini. Hummus. Tomato sauce. Olives. Mushrooms. Limit these Grains: Prepackaged pasta or rice dishes. Prepackaged cereal with added sugar. Vegetables: Deep fried potatoes (french fries). Fruits: Fruit canned in syrup. Meats and other protein foods: Beef. Pork. Lamb. Poultry with skin. Hot dogs. [place] Salines. Dairy: Ice cream. Sour cream. Whole milk. Beverages: Juice. Sugar-sweetened soft drinks. Beer. Liquor and spirits. Fats and oils: Butter. Canola oil. Vegetable oil. Beef fat (tallow). Lard. Sweets and desserts: Cookies. Cakes.  Pies. Candy. Seasoning and other foods: Mayonnaise. Premade sauces and marinades. The items listed may not be a complete list. Talk with your dietitian about what dietary choices are right for you. Summary The Mediterranean diet includes both food and lifestyle choices. Eat a variety of fresh fruits and vegetables, beans, nuts, seeds, and whole grains. Limit the amount of red meat and sweets that you eat. Talk with your  health care provider about whether it is safe for you to drink red wine in moderation. This means 1 glass a day for nonpregnant women and 2 glasses a day for men. A glass of wine equals 5 oz (150 mL). This information is not intended to replace advice given to you by your health care provider. Make sure you discuss any questions you have with your health care provider. Document Released: 07/27/2016 Document Revised: 08/29/2016 Document Reviewed: 07/27/2016 Elsevier Interactive Patient Education  2017 [employer].

## 2022-06-02 NOTE — Progress Notes (Unsigned)
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 Last MoCA 05/05/22 was 12/30. 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    Recommendations:    Start memantine 5 mg take 1 po qhs x 2 weeks then increase to 5 mg bid if tolerated ,side effects discussed Follow up in 6 months.   Case discussed with Dr. Delice Lesch who agrees with the plan     Subjective:    Alison Brewer is a very pleasant 87 y.o. [name]  seen today in follow up for memory loss. This patient is accompanied in the office by her who supplements the history.  Previous records as well as any outside records available were reviewed prior to todays visit.  Patient was last seen at our office on  at which time her  Patient is currently on   Any changes in memory since last visit? Memory is the same, worse when tired or stressed "and I have a lot of it"  Patient lives with:  grandson and his wife repeats oneself? Occasionally  Disoriented when walking into a room?  Patient denies   Leaving objects in unusual places?  Patient denies. "She is very organized"  Ambulates  with difficulty?   Patient denies but uses L cane to ambulate. Does not like to go outside, likes to walk around the house.  Recent falls?  Patient denies   Any head injuries?  Patient denies   History of seizures?   Patient denies   Wandering behavior?  Patient denies   Patient drives?  " My children don't want me to drive but I can" Any mood changes such irritability agitation?  Patient denies   Any worsening depression?:  Patient denies   Hallucinations?  Patient denies   Paranoia?  Patient denies   Patient reports that he sleeps  well without vivid dreams, REM behavior or sleepwalking   History of sleep apnea?  Patient denies   Any hygiene concerns?  Patient denies   Independent of bathing and dressing?  Endorsed  Does the patient needs help with medications?  Patient's grandson in charge  Who is in charge of the finances?  Patient is in charge  Any changes in appetite?  Patient denies   Patient have trouble swallowing? Patient denies   Does the patient cook?  Patient denies. Her grandson does all the cooking Any kitchen accidents such as leaving the stove on? Patient denies   Any headaches?  Patient denies  any recent headaches   Vision changes? Patient denies, but she is due to see ophthalmology   Any focal numbness or tingling?  Patient denies   Chronic back pain Patient denies   Unilateral weakness?  Patient denies   Any tremors?  Patient denies   Any history of anosmia?  Patient has chronic sense of smell  Any incontinence of urine?  Endorsed, no recent UTI   Any bowel dysfunction?   Patient denies      PREVIOUS MEDICATIONS:   CURRENT MEDICATIONS:  Outpatient Encounter Medications as of 06/02/2022  Medication Sig   Calcium Carbonate (CALCIUM 500 PO) Take 1 capsule by mouth daily.   memantine (NAMENDA) 5 MG tablet Take 1  tablet at bedtime for 2 weeks, then 1 tablet twice daily.   Multiple Vitamin (MULTIVITAMIN) tablet Take 1 tablet by mouth daily.   sodium chloride (MURO 128) 2 % ophthalmic solution Place 1 drop into both eyes daily.   No facility-administered encounter medications on file as of 06/02/2022.        No data to display            05/05/2022   10:00 AM  Montreal Cognitive Assessment   Visuospatial/ Executive (0/5) 0  Naming (0/3) 3  Attention: Read list of digits (0/2) 2  Attention: Read list of letters (0/1) 1  Attention: Serial 7 subtraction starting at 100 (0/3) 0  Language: Repeat phrase (0/2) 1  Language : Fluency (0/1) 0  Abstraction (0/2) 0  Delayed Recall (0/5) 0   Orientation (0/6) 4  Total 11  Adjusted Score (based on education) 12    Objective:     PHYSICAL EXAMINATION:    VITALS:   Vitals:   06/02/22 1056  BP: 133/65  Pulse: 98  SpO2: 96%  Weight: 129 lb (58.5 kg)  Height: '5\' 3"'$  (1.6 m)    GEN:  The patient appears stated age and is in NAD. HEENT:  Normocephalic, atraumatic.   Neurological examination:  General: NAD, well-groomed, appears stated age. Orientation: The patient is alert. Oriented to person, place not to date Cranial nerves: There is good facial symmetry.The speech is fluent and clear but tangential at times. No aphasia or dysarthria. Fund of knowledge is appropriate. Recent and remote memory are impaired. Attention and concentration are reduced.  Able to name objects and repeat phrases.  Hearing is intact to conversational tone.    Sensation: Sensation is intact to light touch throughout Motor: Strength is at least antigravity x4. Tremors: none  DTR's 2/4 in UE/LE     Movement examination: Tone: There is normal tone in the UE/LE Abnormal movements:  no tremor.  No myoclonus.  No asterixis.   Coordination:  There is no decremation with RAM's. Normal finger to nose  Gait and Station: The patient has difficulty arising out of a deep-seated chair without the use of the hands, needs a cane. The patient's stride length is good.  Gait is cautious and narrow.    Thank you for allowing [place] the opportunity to participate in the care of this nice patient. Please do not hesitate to contact us for any questions or concerns.   Total time spent on today's visit was 33 minutes dedicated to this patient today, preparing to see patient, examining the patient, ordering tests and/or medications and counseling the patient, documenting clinical information in the EHR or other health record, independently interpreting results and communicating results to the patient/family, discussing treatment and goals, answering patient's questions and  coordinating care.  Cc:  [name], NP  Sharene Butters 06/03/2022 4:59 PM

## 2022-06-06 ENCOUNTER — Ambulatory Visit: Payer: PPO | Attending: Audiologist | Admitting: Audiologist

## 2022-06-06 DIAGNOSIS — H903 Sensorineural hearing loss, bilateral: Secondary | ICD-10-CM | POA: Insufficient documentation

## 2022-06-06 NOTE — Procedures (Signed)
  Outpatient Audiology and [facility] [place], [place]  [address] [phone]  AUDIOLOGICAL  EVALUATION  NAME: Alison Brewer     DOB:   1935/12/14      MRN: [id number]                                                                                     DATE: 06/06/2022     REFERENT: [name], NP STATUS: Outpatient DIAGNOSIS: Sensorineural Hearing Loss Bilateral     History: Alison Brewer was seen for an audiological evaluation. Alison Brewer was accompanied to the appointment by her grandson Alison Brewer is receiving a hearing evaluation due to concerns for difficulty hearing. Alison Brewer has difficulty hearing at all times, especially if people are not looking at her. This difficulty began gradually over the last year. No pain or pressure reported in either ear. Tinnitus denied for both ears. Alison Brewer has no history of noise exposure.  Medical history positive for dementia which is a risk factor for hearing loss. No other relevant case history reported.   Evaluation:  Otoscopy showed a clear view of the tympanic membranes, bilaterally Tympanometry results were consistent with normal middle ear function, bilaterally   Audiometric testing was completed using conventional audiometry with supraural transducer. Speech Recognition Thresholds were  40dB in the right ear and 40dB in the left ear. Word Recognition was performed  40dB SL, scored  92% in the right ear and 96% in the left ear. Pure tone thresholds show normal hearing sloping to moderately severe high pitched sensorineural hearing loss bilaterally.   Results:  The test results were reviewed with Alison Brewer and her grandson. Alison Brewer has a high pitched hearing loss from age related changes. She needs hearing aids in both ears. She was given a list of local providers that dispense hearing aids. Degree and nature of loss was explained. All questions were answered. Alison Brewer was given a copy of her audiogram.     Recommendations: Amplification is necessary for both ears. Hearing aids can be purchased from a variety of locations. See provided list for locations in the [place] area.    43 minutes spent testing and counseling on results.   Alfonse Alpers  Audiologist, Au.D., CCC-A 06/06/2022  1:36 PM  Cc: [name], NP

## 2022-06-08 DIAGNOSIS — R419 Unspecified symptoms and signs involving cognitive functions and awareness: Secondary | ICD-10-CM | POA: Diagnosis not present

## 2022-08-04 ENCOUNTER — Encounter: Payer: Self-pay | Admitting: Nurse Practitioner

## 2022-08-04 ENCOUNTER — Ambulatory Visit (INDEPENDENT_AMBULATORY_CARE_PROVIDER_SITE_OTHER): Payer: PPO | Admitting: Nurse Practitioner

## 2022-08-04 VITALS — BP 126/74 | HR 77 | Temp 97.1°F | Ht 63.0 in | Wt 126.0 lb

## 2022-08-04 DIAGNOSIS — J069 Acute upper respiratory infection, unspecified: Secondary | ICD-10-CM

## 2022-08-04 NOTE — Progress Notes (Signed)
Careteam: Patient Care Team: Alison Chandler, NP as PCP - General (Geriatric Medicine) Alison Booze, MD as PCP - Cardiology (Cardiology)  PLACE OF SERVICE:  [facility] Directive information    No Known Allergies  Chief Complaint  Patient presents with   Acute Visit    Cough x 1 days. Denies fever. Here with grandson Alison Brewer      HPI: Patient is a 87 y.o. female for cough and congestion for 1 days.  Reports no sore throat.  She occasionally will cough up phlegm that is thick but clear.  No fever or chills.  Eating well and drinking at this time.  No increase in fatigue but did take a nap today.  Reports runny nose.    Lives with grandson and his wife.  Grandson reports several nights ago she had an episodes where she was drinking juice and got choked on it otherwise does not have trouble with eating or swallowing He is concerned as last time she had URI developed pneumonia.   Review of Systems:  Review of Systems  Constitutional:  Negative for chills, fever, malaise/fatigue and weight loss.  HENT:  Negative for congestion, ear discharge, ear pain, sinus pain, sore throat and tinnitus.   Respiratory:  Positive for cough and sputum production. Negative for shortness of breath and wheezing.   Cardiovascular:  Negative for chest pain and palpitations.  Neurological:  Negative for dizziness and headaches.    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 Her smoking use included cigarettes. She has a 27.00 pack-year smoking history. She has never used smokeless tobacco. She reports that she does not  drink alcohol and does not use drugs.  Family History  Problem Relation Age of Onset   Pancreatic cancer Mother    Coronary artery disease Father    Cancer Brother    Breast cancer Maternal Aunt     Medications: Patient's Medications  New Prescriptions   No medications on file  Previous Medications   CALCIUM CARBONATE (CALCIUM 500 PO)    Take 1 capsule by mouth daily.   MEMANTINE (NAMENDA) 5 MG TABLET    Take 1 tablet at bedtime for 2 weeks, then 1 tablet twice daily.   MULTIPLE VITAMIN (MULTIVITAMIN) TABLET    Take 1 tablet by mouth daily.   SODIUM CHLORIDE (MURO 128) 2 % OPHTHALMIC SOLUTION    Place 1 drop into both eyes daily.  Modified Medications   No medications on file  Discontinued Medications   No medications on file    Physical Exam:  Vitals:   08/04/22 1448  BP: 126/74  Pulse: 77  Temp: (!) 97.1 F (36.2 C)  TempSrc: Temporal  SpO2: 96%  Weight: 126 lb (57.2 kg)  Height: '5\' 3"'$  (1.6 m)   Body mass index is 22.32 kg/m. Wt Readings from Last 3 Encounters:  08/04/22 126 lb (57.2 kg)  06/02/22 129 lb (58.5 kg)  05/29/22 128 lb 6.4 oz (58.2 kg)    Physical Exam Constitutional:      General: She is  not in acute distress.    Appearance: She is well-developed. She is not diaphoretic.     Comments: Thin   HENT:     Head: Normocephalic and atraumatic.     Mouth/Throat:     Pharynx: No oropharyngeal exudate.  Eyes:     Conjunctiva/sclera: Conjunctivae normal.     Pupils: Pupils are equal, round, and reactive to light.  Cardiovascular:     Rate and Rhythm: Normal rate and regular rhythm.     Heart sounds: Normal heart sounds.  Pulmonary:     Effort: Pulmonary effort is normal.     Breath sounds: Normal breath sounds.  Abdominal:     General: Bowel sounds are normal.     Palpations: Abdomen is soft.  Musculoskeletal:     Cervical back: Normal range of motion and neck supple.     Right lower leg: No edema.     Left lower leg: No edema.  Skin:     General: Skin is warm and dry.  Neurological:     Mental Status: She is alert.  Psychiatric:        Mood and Affect: Mood normal.     Labs reviewed: Basic Metabolic Panel: Recent Labs    02/13/22 1217 05/29/22 1125  NA 141 137  K 4.3 4.9  CL 105 103  CO2 28 27  GLUCOSE 99 90  BUN 20 28*  CREATININE 1.03* 1.13*  CALCIUM 10.0 9.1   Liver Function Tests: Recent Labs    02/13/22 1217 05/29/22 1125  AST 28 23  ALT 22 17  ALKPHOS 82  --   BILITOT 0.8 0.5  PROT 6.5 6.2  ALBUMIN 4.0  --    No results for input(s): "LIPASE", "AMYLASE" in the last 8760 hours. No results for input(s): "AMMONIA" in the last 8760 hours. CBC: Recent Labs    02/13/22 1217 05/29/22 1125  WBC 4.6 4.9  NEUTROABS 2.7 2,783  HGB 11.8* 11.7  HCT 35.8* 35.0  MCV 94.0 91.1  PLT 184 192   Lipid Panel: Recent Labs    05/29/22 1125  CHOL 174  HDL 82  LDLCALC 70  TRIG 135  CHOLHDL 2.1   TSH: No results for input(s): "TSH" in the last 8760 hours. A1C: No results found for: "HGBA1C"   Assessment/Plan 1. URI with cough and congestion -supportive care at this time -discussed viral illness can last 7-10 days Plain nasal saline spray throughout the day as needed for nasal congestion May use tylenol 325 mg 2 tablets every 6-8 hours as needed aches and pains or sore throat humidifier in the home to help with the dry air Mucinex DM by mouth twice daily as needed for cough and chest congestion with full glass of water  Keep well hydrated Make sure sitting up during the day- deep breathing.  Proper nutrition  Avoid forcefully blowing nose Vit c 1000 mg daily Vit d 2000 units daily  -if symptoms worsen they will follow up. Also due to episode if symptoms progress after several days to get chest xray to rule out pneumonia.  - DG Chest 2 View; Future  Alison Brewer K. Lely, [facility] [phone]

## 2022-08-04 NOTE — Patient Instructions (Addendum)
  Plain nasal saline spray throughout the day as needed for nasal congestion May use tylenol 325 mg 2 tablets every 6-8 hours as needed aches and pains or sore throat humidifier in the home to help with the dry air Mucinex DM by mouth twice daily as needed for cough and chest congestion with full glass of water  Keep well hydrated Make sure sitting up during the day- deep breathing.  Proper nutrition  Avoid forcefully blowing nose Vit c 1000 mg daily Vit d 2000 units daily

## 2022-08-07 ENCOUNTER — Ambulatory Visit
Admission: RE | Admit: 2022-08-07 | Discharge: 2022-08-07 | Disposition: A | Discharge status: Home / Self Care | Payer: PPO | Source: Ambulatory Visit | Attending: Nurse Practitioner | Admitting: Nurse Practitioner

## 2022-08-07 DIAGNOSIS — J069 Acute upper respiratory infection, unspecified: Secondary | ICD-10-CM

## 2022-08-07 DIAGNOSIS — R059 Cough, unspecified: Secondary | ICD-10-CM | POA: Diagnosis not present

## 2022-08-08 ENCOUNTER — Telehealth: Payer: Self-pay

## 2022-08-08 NOTE — Telephone Encounter (Signed)
Spoke with patients grandson and he wanted to know if the chest X-Ray results were ready to make sure his grandmother does not have pneumonia again.routed to PCP

## 2022-08-09 ENCOUNTER — Ambulatory Visit: Payer: PPO | Admitting: Dermatology

## 2022-08-09 DIAGNOSIS — B351 Tinea unguium: Secondary | ICD-10-CM | POA: Diagnosis not present

## 2022-08-09 DIAGNOSIS — L570 Actinic keratosis: Secondary | ICD-10-CM | POA: Diagnosis not present

## 2022-08-09 MED ORDER — CICLOPIROX OLAMINE 0.77 % EX SUSP: CUTANEOUS | 1 refills | Status: DC

## 2022-08-09 NOTE — Telephone Encounter (Signed)
See result notes no pneumonia

## 2022-08-09 NOTE — Progress Notes (Signed)
Follow-Up Visit   Subjective  Alison Brewer is a 87 y.o. female who presents for the following: Growth (L hand x 1-2 months. Not painful, not going away. ). She also has dark area on the right thumb nail. She hasn't been gardening, no known uses of any cleaning products that would cause staining.  No hobbies using paint.  No nail polish.  Patient accompanied by grandson.    The following portions of the chart were reviewed this encounter and updated as appropriate:       Review of Systems:  No other skin or systemic complaints except as noted in HPI or Assessment and Plan.  Objective  Well appearing patient in no apparent distress; mood and affect are within normal limits.  A focused examination was performed including face, hands, nails. Relevant physical exam findings are noted in the Assessment and Plan.  Left 4th PIP Keratotic papule  Right Thumb Nail Distal mild yellow brown discoloration with linear narrow pigmented lines. R thumbnail     Left Dorsal Hand Pink scaly macule    Assessment & Plan  Hypertrophic actinic keratosis Left 4th PIP  Recheck on f/up  Actinic keratoses are precancerous spots that appear secondary to cumulative UV radiation exposure/sun exposure over time. They are chronic with expected duration over 1 year. A portion of actinic keratoses will progress to squamous cell carcinoma of the skin. It is not possible to reliably predict which spots will progress to skin cancer and so treatment is recommended to prevent development of skin cancer.  Recommend daily broad spectrum sunscreen SPF 30+ to sun-exposed areas, reapply every 2 hours as needed.  Recommend staying in the shade or wearing long sleeves, sun glasses (UVA+UVB protection) and wide brim hats (4-inch brim around the entire circumference of the hat). Call for new or changing lesions.  Destruction of lesion - Left 4th PIP  Destruction method: cryotherapy   Informed consent: discussed  and consent obtained   Lesion destroyed using liquid nitrogen: Yes   Region frozen until ice ball extended beyond lesion: Yes   Outcome: patient tolerated procedure well with no complications   Post-procedure details: wound care instructions given   Additional details:  Prior to procedure, discussed risks of blister formation, small wound, skin dyspigmentation, or rare scar following cryotherapy. Recommend Vaseline ointment to treated areas while healing.   Tinea unguium Right Thumb Nail  Vrs trauma, mild  Start Ciclopirox nail laquer apply to affected nail QHS, removing with nail polish remover once a week dsp 30m 1Rf.   ciclopirox (LOPROX) 0.77 % SUSP - Right Thumb Nail Apply to affected nail every night, removing with nail polish remover once a week.  AK (actinic keratosis) Left Dorsal Hand  Actinic keratoses are precancerous spots that appear secondary to cumulative UV radiation exposure/sun exposure over time. They are chronic with expected duration over 1 year. A portion of actinic keratoses will progress to squamous cell carcinoma of the skin. It is not possible to reliably predict which spots will progress to skin cancer and so treatment is recommended to prevent development of skin cancer.  Recommend daily broad spectrum sunscreen SPF 30+ to sun-exposed areas, reapply every 2 hours as needed.  Recommend staying in the shade or wearing long sleeves, sun glasses (UVA+UVB protection) and wide brim hats (4-inch brim around the entire circumference of the hat). Call for new or changing lesions.  Destruction of lesion - Left Dorsal Hand  Destruction method: cryotherapy   Informed consent: discussed and  consent obtained   Lesion destroyed using liquid nitrogen: Yes   Region frozen until ice ball extended beyond lesion: Yes   Outcome: patient tolerated procedure well with no complications   Post-procedure details: wound care instructions given   Additional details:  Prior to  procedure, discussed risks of blister formation, small wound, skin dyspigmentation, or rare scar following cryotherapy. Recommend Vaseline ointment to treated areas while healing.    Return as scheduled, for TBSE.  [name], CMA, am acting as scribe for [name], MD .   Documentation: I have reviewed the above documentation for accuracy and completeness, and I agree with the above.  [name] MD

## 2022-08-09 NOTE — Patient Instructions (Addendum)
Cryotherapy Aftercare  Wash gently with soap and water everyday.   Apply Vaseline and Band-Aid daily until healed.     Due to recent changes in healthcare laws, you may see results of your pathology and/or laboratory studies on MyChart before the doctors have had a chance to review them. We understand that in some cases there may be results that are confusing or concerning to you. Please understand that not all results are received at the same time and often the doctors may need to interpret multiple results in order to provide you with the best plan of care or course of treatment. Therefore, we ask that you please give us 2 business days to thoroughly review all your results before contacting the office for clarification. Should we see a critical lab result, you will be contacted sooner.   If You Need Anything After Your Visit  If you have any questions or concerns for your doctor, please call our main line at [phone] and press option 4 to reach your doctor's medical assistant. If no one answers, please leave a voicemail as directed and we will return your call as soon as possible. Messages left after 4 pm will be answered the following business day.   You may also send us a message via MyChart. We typically respond to MyChart messages within 1-2 business days.  For prescription refills, please ask your pharmacy to contact our office. Our fax number is [phone].  If you have an urgent issue when the clinic is closed that cannot wait until the next business day, you can page your doctor at the number below.    Please note that while we do our best to be available for urgent issues outside of office hours, we are not available 24/7.   If you have an urgent issue and are unable to reach us, you may choose to seek medical care at your doctor's office, retail clinic, urgent care center, or emergency room.  If you have a medical emergency, please immediately call 911 or go to the  emergency department.  Pager Numbers  - Dr. Kowalski: [phone]  - Dr. Moye: [phone]  - Dr. Stewart: [phone]  In the event of inclement weather, please call our main line at [phone] for an update on the status of any delays or closures.  Dermatology Medication Tips: Please keep the boxes that topical medications come in in order to help keep track of the instructions about where and how to use these. Pharmacies typically print the medication instructions only on the boxes and not directly on the medication tubes.   If your medication is too expensive, please contact our office at [phone] option 4 or send us a message through MyChart.   We are unable to tell what your co-pay for medications will be in advance as this is different depending on your insurance coverage. However, we may be able to find a substitute medication at lower cost or fill out paperwork to get insurance to cover a needed medication.   If a prior authorization is required to get your medication covered by your insurance company, please allow us 1-2 business days to complete this process.  Drug prices often vary depending on where the prescription is filled and some pharmacies may offer cheaper prices.  The website www.goodrx.com contains coupons for medications through different pharmacies. The prices here do not account for what the cost may be with help from insurance (it may be cheaper with your insurance), but the website can   give you the price if you did not use any insurance.  - You can print the associated coupon and take it with your prescription to the pharmacy.  - You may also stop by our office during regular business hours and pick up a GoodRx coupon card.  - If you need your prescription sent electronically to a different pharmacy, notify our office through [place] MyChart or by phone at [phone] option 4.     Si Usted Necesita Algo Despus de Su Visita  Tambin puede  enviarnos un mensaje a travs de MyChart. Por lo general respondemos a los mensajes de MyChart en el transcurso de 1 a 2 das hbiles.  Para renovar recetas, por favor pida a su farmacia que se ponga en contacto con nuestra oficina. Nuestro nmero de fax es el [phone].  Si tiene un asunto urgente cuando la clnica est cerrada y que no puede esperar hasta el siguiente da hbil, puede llamar/localizar a su doctor(a) al nmero que aparece a continuacin.   Por favor, tenga en cuenta que aunque hacemos todo lo posible para estar disponibles para asuntos urgentes fuera del horario de oficina, no estamos disponibles las 24 horas del da, los 7 das de la semana.   Si tiene un problema urgente y no puede comunicarse con nosotros, puede optar por buscar atencin mdica  en el consultorio de su doctor(a), en una clnica privada, en un centro de atencin urgente o en una sala de emergencias.  Si tiene una emergencia mdica, por favor llame inmediatamente al 911 o vaya a la sala de emergencias.  Nmeros de bper  - Dr. Kowalski: [phone]  - Dra. Moye: [phone]  - Dra. Stewart: [phone]  En caso de inclemencias del tiempo, por favor llame a nuestra lnea principal al [phone] para una actualizacin sobre el estado de cualquier retraso o cierre.  Consejos para la medicacin en dermatologa: Por favor, guarde las cajas en las que vienen los medicamentos de uso tpico para ayudarle a seguir las instrucciones sobre dnde y cmo usarlos. Las farmacias generalmente imprimen las instrucciones del medicamento slo en las cajas y no directamente en los tubos del medicamento.   Si su medicamento es muy caro, por favor, pngase en contacto con nuestra oficina llamando al [phone] y presione la opcin 4 o envenos un mensaje a travs de MyChart.   No podemos decirle cul ser su copago por los medicamentos por adelantado ya que esto es diferente dependiendo de la cobertura de su seguro.  Sin embargo, es posible que podamos encontrar un medicamento sustituto a menor costo o llenar un formulario para que el seguro cubra el medicamento que se considera necesario.   Si se requiere una autorizacin previa para que su compaa de seguros cubra su medicamento, por favor permtanos de 1 a 2 das hbiles para completar este proceso.  Los precios de los medicamentos varan con frecuencia dependiendo del lugar de dnde se surte la receta y alguna farmacias pueden ofrecer precios ms baratos.  El sitio web www.goodrx.com tiene cupones para medicamentos de diferentes farmacias. Los precios aqu no tienen en cuenta lo que podra costar con la ayuda del seguro (puede ser ms barato con su seguro), pero el sitio web puede darle el precio si no utiliz ningn seguro.  - Puede imprimir el cupn correspondiente y llevarlo con su receta a la farmacia.  - Tambin puede pasar por nuestra oficina durante el horario de atencin regular y recoger una tarjeta de cupones de GoodRx.  -   Si necesita que su receta se enve electrnicamente a una farmacia diferente, informe a nuestra oficina a travs de MyChart de [place] o por telfono llamando al [phone] y presione la opcin 4.  

## 2022-08-12 ENCOUNTER — Ambulatory Visit
Admission: EM | Admit: 2022-08-12 | Discharge: 2022-08-12 | Disposition: A | Discharge status: Home / Self Care | Payer: PPO | Attending: Family Medicine | Admitting: Family Medicine

## 2022-08-12 ENCOUNTER — Encounter: Payer: Self-pay | Admitting: Nurse Practitioner

## 2022-08-12 DIAGNOSIS — J22 Unspecified acute lower respiratory infection: Secondary | ICD-10-CM

## 2022-08-12 MED ORDER — ALBUTEROL SULFATE HFA 108 (90 BASE) MCG/ACT IN AERS
1.0000 | INHALATION_SPRAY | Freq: Four times a day (QID) | RESPIRATORY_TRACT | 0 refills | Status: AC | PRN
Start: 2022-08-12 — End: ?

## 2022-08-12 MED ORDER — AZITHROMYCIN 250 MG PO TABS: ORAL_TABLET | ORAL | 0 refills | Status: DC

## 2022-08-12 NOTE — ED Provider Notes (Signed)
[facility]    CSN: [id number] Arrival date & time: 08/12/22  9476      History   Chief Complaint Chief Complaint  Patient presents with   Cough    HPI Alison Brewer is a 87 y.o. female.   Presenting today with going on 2 weeks of hacking productive cough now with green sputum.  Denies significant chest pain, shortness of breath, fever, chills, abdominal pain, nausea vomiting or diarrhea.  Was seen by her primary care provider several days ago, chest x-ray was negative at that time and she was told to take Mucinex.  She is been taking Mucinex consistently with minimal relief.  No known history of chronic pulmonary disease.    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      Home Medications    Prior to Admission medications   Medication Sig Start Date End Date Taking? Authorizing Provider  albuterol (VENTOLIN HFA) 108 (90 Base) MCG/ACT inhaler Inhale 1-2 puffs into the lungs every 6 (six) hours as needed for wheezing or shortness of breath. 08/12/22  Yes Volney American, PA-C  azithromycin (ZITHROMAX) 250 MG tablet Take first 2 tablets together, then 1 every day until finished. 08/12/22  Yes Volney American, PA-C  Calcium Carbonate (CALCIUM 500 PO) Take 1 capsule by mouth daily.    [provider]  ciclopirox (LOPROX) 0.77 % SUSP Apply to affected nail every night, removing with nail polish remover once a week. 08/09/22   [name], MD  memantine (NAMENDA) 5 MG tablet Take 1 tablet at bedtime  for 2 weeks, then 1 tablet twice daily. 06/02/22   Rondel Jumbo, PA-C  Multiple Vitamin (MULTIVITAMIN) tablet Take 1 tablet by mouth daily.    [provider]  sodium chloride (MURO 128) 2 % ophthalmic solution Place 1 drop into both eyes daily.    [provider]    Family History Family History  Problem Relation Age of Onset   Pancreatic cancer Mother    Coronary artery disease Father    Cancer Brother    Breast cancer Maternal Aunt     Social History Social History   Tobacco Use   Smoking status: Former    Packs/day: 1.50    Years: 18.00    Total pack years: 27.00    Types: Cigarettes    Quit date: 1980    Years since quitting: 43.6   Smokeless tobacco: Never  Vaping Use   Vaping Use: Never used  Substance Use Topics   Alcohol use: Never   Drug use: Never     Allergies   Patient has no known allergies.   Review of Systems Review of Systems Per HPI  Physical Exam Triage Vital Signs ED Triage Vitals  Enc Vitals Group     BP 08/12/22 1026 (!) 146/70     Pulse Rate 08/12/22 1026 65     Resp 08/12/22 1026 18  Temp 08/12/22 1026 98.1 F (36.7 C)     Temp Source 08/12/22 1026 Oral     SpO2 08/12/22 1026 97 %     Weight --      Height --      Head Circumference --      Peak Flow --      Pain Score 08/12/22 1024 0     Pain Loc --      Pain Edu? --      Excl. in [place]? --    No data found.  Updated Vital Signs BP (!) 146/70 (BP Location: Right Arm)   Pulse 65   Temp 98.1 F (36.7 C) (Oral)   Resp 18   SpO2 97%   Visual Acuity Right Eye Distance:   Left Eye Distance:   Bilateral Distance:    Right Eye Near:   Left Eye Near:    Bilateral Near:     Physical Exam Vitals and nursing note reviewed.  Constitutional:      Appearance: Normal appearance.  HENT:     Head: Atraumatic.     Right Ear: Tympanic membrane and external ear normal.     Left Ear: Tympanic membrane and external ear normal.     Nose: Rhinorrhea present.      Mouth/Throat:     Mouth: Mucous membranes are moist.     Pharynx: Posterior oropharyngeal erythema present.  Eyes:     Extraocular Movements: Extraocular movements intact.     Conjunctiva/sclera: Conjunctivae normal.  Cardiovascular:     Rate and Rhythm: Normal rate and regular rhythm.     Heart sounds: Normal heart sounds.  Pulmonary:     Effort: Pulmonary effort is normal. No respiratory distress.     Breath sounds: Wheezing present. No rales.     Comments: Trace wheezes bilaterally Musculoskeletal:        General: Normal range of motion.     Cervical back: Normal range of motion and neck supple.  Skin:    General: Skin is warm and dry.  Neurological:     Mental Status: She is alert and oriented to person, place, and time.  Psychiatric:        Mood and Affect: Mood normal.        Thought Content: Thought content normal.      UC Treatments / Results  Labs (all labs ordered are listed, but only abnormal results are displayed) Labs Reviewed - No data to display  EKG   Radiology No results found.  Procedures Procedures (including critical care time)  Medications Ordered in UC Medications - No data to display  Initial Impression / Assessment and Plan / UC Course  I have reviewed the triage vital signs and the nursing notes.  Pertinent labs & imaging results that were available during my care of the patient were reviewed by me and considered in my medical decision making (see chart for details).     Recommended starting an allergy medication for ongoing postnasal drainage, runny nose.  Given duration worsening course of her hacking productive cough, will start azithromycin and albuterol inhaler in addition to the Mucinex.  Return for any worsening symptoms.  Final Clinical Impressions(s) / UC Diagnoses   Final diagnoses:  Lower respiratory infection   Discharge Instructions   None    ED Prescriptions     Medication Sig Dispense Auth. Provider    azithromycin (ZITHROMAX) 250 MG tablet Take first 2 tablets together, then 1 every day until finished. 6  tablet Volney American, [place]   albuterol (VENTOLIN HFA) 108 (90 Base) MCG/ACT inhaler Inhale 1-2 puffs into the lungs every 6 (six) hours as needed for wheezing or shortness of breath. 18 g Volney American, [place]      PDMP not reviewed this encounter.   Volney American, [place] 08/12/22 1046

## 2022-08-12 NOTE — ED Triage Notes (Signed)
Pt states she has had a cough x10 days ago.   Pt went to PCP last week and was given Mucinex and a chest xray.   Pt states she is coughing up green sputum.   Denies Fever

## 2022-09-01 ENCOUNTER — Ambulatory Visit (INDEPENDENT_AMBULATORY_CARE_PROVIDER_SITE_OTHER): Payer: PPO | Admitting: Nurse Practitioner

## 2022-09-01 ENCOUNTER — Encounter: Payer: Self-pay | Admitting: Nurse Practitioner

## 2022-09-01 VITALS — BP 138/86 | HR 70 | Temp 98.2°F | Ht 64.0 in | Wt 126.4 lb

## 2022-09-01 DIAGNOSIS — J069 Acute upper respiratory infection, unspecified: Secondary | ICD-10-CM | POA: Diagnosis not present

## 2022-09-01 DIAGNOSIS — Z23 Encounter for immunization: Secondary | ICD-10-CM | POA: Diagnosis not present

## 2022-09-01 DIAGNOSIS — W19XXXA Unspecified fall, initial encounter: Secondary | ICD-10-CM | POA: Diagnosis not present

## 2022-09-01 DIAGNOSIS — F01B0 Vascular dementia, moderate, without behavioral disturbance, psychotic disturbance, mood disturbance, and anxiety: Secondary | ICD-10-CM

## 2022-09-01 DIAGNOSIS — R2689 Other abnormalities of gait and mobility: Secondary | ICD-10-CM

## 2022-09-01 NOTE — Progress Notes (Signed)
Careteam: Patient Care Team: [name], NP as PCP - General (Geriatric Medicine) Jettie Booze, MD as PCP - Cardiology (Cardiology)  PLACE OF SERVICE:  [place] Directive information Does Patient Have a Medical Advance Directive?: Yes, Type of Advance Directive: [place], Does patient want to make changes to medical advance directive?: No - Patient declined  No Known Allergies  Chief Complaint  Patient presents with   Medical Management of Chronic Issues    3 month follow-up. Discuss need for td/tdap, covid booster, shingrix, and flu vaccine or post pone if patient refuses.      HPI: Patient is a 87 y.o. female for routine follow up.   Went to urgent care due to progressive cough. Improved some with the albuterol. Placed on zpack but grandson did not feel like it made much difference.   Htn- well controlled. Not currently on medication.   Dementia- lives with grandson- continues on [employer] on ASA 81 mg daly  Needs handicap placard  Reports she is very off balance- uses a cane and holds onto the walls to walk.  Grandson feels like she needs a walker for better mobility.  Put her hand on a rocking chair and it tipped over.   She is very independent. Does ADLs herself.   Review of Systems:  Review of Systems  Constitutional:  Negative for chills, fever and weight loss.  HENT:  Negative for tinnitus.   Respiratory:  Negative for cough, sputum production and shortness of breath.   Cardiovascular:  Negative for chest pain, palpitations and leg swelling.  Gastrointestinal:  Negative for abdominal pain, constipation, diarrhea and heartburn.  Genitourinary:  Negative for dysuria, frequency and urgency.  Musculoskeletal:  Negative for back pain, falls, joint pain and myalgias.  Skin: Negative.   Neurological:  Negative for dizziness and headaches.  Psychiatric/Behavioral:  Positive for memory loss. Negative for  depression. The patient does not have insomnia.     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 Her smoking use included cigarettes. She has a 27.00 pack-year smoking history. She has never used smokeless tobacco. She reports that she does not drink alcohol and does not use drugs.  Family History  Problem Relation Age of Onset   Pancreatic cancer Mother    Coronary artery disease Father    Cancer Brother    Breast cancer Maternal Aunt     Medications: Patient's Medications  New Prescriptions   No medications on file  Previous Medications   ALBUTEROL (VENTOLIN HFA) 108 (90 BASE) MCG/ACT INHALER    Inhale 1-2 puffs into the lungs every 6 (six) hours as needed for wheezing or shortness of breath.   ASPIRIN EC 81 MG TABLET    Take 81 mg by mouth daily. Swallow whole.   CALCIUM CARBONATE (CALCIUM 500 PO)    Take 1 capsule by mouth daily.   CICLOPIROX (LOPROX) 0.77 % SUSP    Apply to affected nail every night, removing with nail polish remover once a week.   MEMANTINE (NAMENDA) 5  MG TABLET    Take 1 tablet at bedtime for 2 weeks, then 1 tablet twice daily.   MULTIPLE VITAMIN (MULTIVITAMIN) TABLET    Take 1 tablet by mouth daily.   SODIUM CHLORIDE (MURO 128) 2 % OPHTHALMIC SOLUTION    Place 1 drop into both eyes daily.  Modified Medications   No medications on file  Discontinued Medications   AZITHROMYCIN (ZITHROMAX) 250 MG TABLET    Take first 2 tablets together, then 1 every day until finished.    Physical Exam:  Vitals:   09/01/22 1008  BP: 138/86  Pulse: 70  Temp: 98.2 F (36.8 C)  SpO2: 97%  Weight: 126 lb 6.4 oz (57.3  kg)  Height: '5\' 4"'$  (1.626 m)   Body mass index is 21.7 kg/m. Wt Readings from Last 3 Encounters:  09/01/22 126 lb 6.4 oz (57.3 kg)  08/04/22 126 lb (57.2 kg)  06/02/22 129 lb (58.5 kg)    Physical Exam Constitutional:      General: She is not in acute distress.    Appearance: She is well-developed. She is not diaphoretic.  HENT:     Head: Normocephalic and atraumatic.     Mouth/Throat:     Pharynx: No oropharyngeal exudate.  Eyes:     Conjunctiva/sclera: Conjunctivae normal.     Pupils: Pupils are equal, round, and reactive to light.  Cardiovascular:     Rate and Rhythm: Normal rate and regular rhythm.     Heart sounds: Normal heart sounds.  Pulmonary:     Effort: Pulmonary effort is normal.     Breath sounds: Normal breath sounds.  Abdominal:     General: Bowel sounds are normal.     Palpations: Abdomen is soft.  Musculoskeletal:     Cervical back: Normal range of motion and neck supple.     Right lower leg: No edema.     Left lower leg: No edema.  Skin:    General: Skin is warm and dry.  Neurological:     Mental Status: She is alert.  Psychiatric:        Mood and Affect: Mood normal.     Labs reviewed: Basic Metabolic Panel: Recent Labs    02/13/22 1217 05/29/22 1125  NA 141 137  K 4.3 4.9  CL 105 103  CO2 28 27  GLUCOSE 99 90  BUN 20 28*  CREATININE 1.03* 1.13*  CALCIUM 10.0 9.1   Liver Function Tests: Recent Labs    02/13/22 1217 05/29/22 1125  AST 28 23  ALT 22 17  ALKPHOS 82  --   BILITOT 0.8 0.5  PROT 6.5 6.2  ALBUMIN 4.0  --    No results for input(s): "LIPASE", "AMYLASE" in the last 8760 hours. No results for input(s): "AMMONIA" in the last 8760 hours. CBC: Recent Labs    02/13/22 1217 05/29/22 1125  WBC 4.6 4.9  NEUTROABS 2.7 2,783  HGB 11.8* 11.7  HCT 35.8* 35.0  MCV 94.0 91.1  PLT 184 192   Lipid Panel: Recent Labs    05/29/22 1125  CHOL 174  HDL 82  LDLCALC 70  TRIG 135  CHOLHDL 2.1   TSH: No results for  input(s): "TSH" in the last 8760 hours. A1C: No results found for: "HGBA1C"   Assessment/Plan 1. Moderate vascular dementia without behavioral disturbance, psychotic disturbance, mood disturbance, or anxiety (HCC) -Stable, no acute changes in cognitive or functional status, continue supportive care.  -continues on namenda  - Ambulatory referral to Home  Health  2. Fall, initial encounter -no injuries but used a chair that tipped over causing fall - Ambulatory referral to [facility] for evaluation and recommendations for mobility and home safety  3. Imbalance -likely will need walker for better balance with mobility -handicap placard completed and given to patients grandson.  - Ambulatory referral to [facility] for evaluation and treatment.   4. Need for influenza vaccination - Flu Vaccine QUAD High Dose(Fluad)  5. URI with cough and congestion -has improved, continues to have slight cough which is slowing resolving.    Return in about 6 months (around 03/02/2023) for routine follow up . Alison Brewer. [place], [facility] [phone]

## 2022-09-19 DIAGNOSIS — Z85828 Personal history of other malignant neoplasm of skin: Secondary | ICD-10-CM | POA: Diagnosis not present

## 2022-09-19 DIAGNOSIS — I119 Hypertensive heart disease without heart failure: Secondary | ICD-10-CM | POA: Diagnosis not present

## 2022-09-19 DIAGNOSIS — E785 Hyperlipidemia, unspecified: Secondary | ICD-10-CM | POA: Diagnosis not present

## 2022-09-19 DIAGNOSIS — Z87891 Personal history of nicotine dependence: Secondary | ICD-10-CM | POA: Diagnosis not present

## 2022-09-19 DIAGNOSIS — Z7982 Long term (current) use of aspirin: Secondary | ICD-10-CM | POA: Diagnosis not present

## 2022-09-19 DIAGNOSIS — F01B0 Vascular dementia, moderate, without behavioral disturbance, psychotic disturbance, mood disturbance, and anxiety: Secondary | ICD-10-CM | POA: Diagnosis not present

## 2022-09-19 DIAGNOSIS — M1990 Unspecified osteoarthritis, unspecified site: Secondary | ICD-10-CM | POA: Diagnosis not present

## 2022-09-19 DIAGNOSIS — J069 Acute upper respiratory infection, unspecified: Secondary | ICD-10-CM | POA: Diagnosis not present

## 2022-09-19 DIAGNOSIS — Z9181 History of falling: Secondary | ICD-10-CM | POA: Diagnosis not present

## 2022-09-19 DIAGNOSIS — Z556 Problems related to health literacy: Secondary | ICD-10-CM | POA: Diagnosis not present

## 2022-09-25 DIAGNOSIS — F0390 Unspecified dementia without behavioral disturbance: Secondary | ICD-10-CM | POA: Diagnosis not present

## 2022-09-27 NOTE — Addendum Note (Signed)
Addended by: Carroll Kinds on: 09/27/2022 11:24 AM   Modules accepted: Level of Service

## 2022-09-29 DIAGNOSIS — I119 Hypertensive heart disease without heart failure: Secondary | ICD-10-CM | POA: Diagnosis not present

## 2022-09-29 DIAGNOSIS — Z7982 Long term (current) use of aspirin: Secondary | ICD-10-CM | POA: Diagnosis not present

## 2022-09-29 DIAGNOSIS — E785 Hyperlipidemia, unspecified: Secondary | ICD-10-CM | POA: Diagnosis not present

## 2022-09-29 DIAGNOSIS — M1990 Unspecified osteoarthritis, unspecified site: Secondary | ICD-10-CM | POA: Diagnosis not present

## 2022-09-29 DIAGNOSIS — Z556 Problems related to health literacy: Secondary | ICD-10-CM | POA: Diagnosis not present

## 2022-09-29 DIAGNOSIS — Z85828 Personal history of other malignant neoplasm of skin: Secondary | ICD-10-CM | POA: Diagnosis not present

## 2022-09-29 DIAGNOSIS — Z9181 History of falling: Secondary | ICD-10-CM | POA: Diagnosis not present

## 2022-09-29 DIAGNOSIS — Z87891 Personal history of nicotine dependence: Secondary | ICD-10-CM | POA: Diagnosis not present

## 2022-09-29 DIAGNOSIS — J069 Acute upper respiratory infection, unspecified: Secondary | ICD-10-CM | POA: Diagnosis not present

## 2022-09-29 DIAGNOSIS — F01B0 Vascular dementia, moderate, without behavioral disturbance, psychotic disturbance, mood disturbance, and anxiety: Secondary | ICD-10-CM | POA: Diagnosis not present

## 2022-10-30 DIAGNOSIS — F01B0 Vascular dementia, moderate, without behavioral disturbance, psychotic disturbance, mood disturbance, and anxiety: Secondary | ICD-10-CM | POA: Diagnosis not present

## 2022-10-30 DIAGNOSIS — M1990 Unspecified osteoarthritis, unspecified site: Secondary | ICD-10-CM | POA: Diagnosis not present

## 2022-10-30 DIAGNOSIS — Z9181 History of falling: Secondary | ICD-10-CM | POA: Diagnosis not present

## 2022-10-30 DIAGNOSIS — Z7982 Long term (current) use of aspirin: Secondary | ICD-10-CM | POA: Diagnosis not present

## 2022-10-30 DIAGNOSIS — Z85828 Personal history of other malignant neoplasm of skin: Secondary | ICD-10-CM | POA: Diagnosis not present

## 2022-10-30 DIAGNOSIS — I119 Hypertensive heart disease without heart failure: Secondary | ICD-10-CM | POA: Diagnosis not present

## 2022-10-30 DIAGNOSIS — Z87891 Personal history of nicotine dependence: Secondary | ICD-10-CM | POA: Diagnosis not present

## 2022-10-30 DIAGNOSIS — J069 Acute upper respiratory infection, unspecified: Secondary | ICD-10-CM | POA: Diagnosis not present

## 2022-10-30 DIAGNOSIS — Z556 Problems related to health literacy: Secondary | ICD-10-CM | POA: Diagnosis not present

## 2022-10-30 DIAGNOSIS — E785 Hyperlipidemia, unspecified: Secondary | ICD-10-CM | POA: Diagnosis not present

## 2022-11-17 ENCOUNTER — Ambulatory Visit (INDEPENDENT_AMBULATORY_CARE_PROVIDER_SITE_OTHER): Payer: PPO | Admitting: Adult Health

## 2022-11-17 ENCOUNTER — Encounter: Payer: Self-pay | Admitting: Adult Health

## 2022-11-17 VITALS — BP 136/70 | HR 73 | Temp 97.3°F | Resp 16 | Ht 64.0 in | Wt 126.0 lb

## 2022-11-17 DIAGNOSIS — N1831 Chronic kidney disease, stage 3a: Secondary | ICD-10-CM

## 2022-11-17 DIAGNOSIS — S8992XS Unspecified injury of left lower leg, sequela: Secondary | ICD-10-CM | POA: Diagnosis not present

## 2022-11-17 NOTE — Patient Instructions (Signed)
Apply warm compress on left shin 4 times a day, wear compression socks daily.

## 2022-11-17 NOTE — Progress Notes (Signed)
[facility]  Provider: Durenda Age DNP  Code Status: Full Code  Goals of Care:     09/01/2022   10:20 AM  Advanced Directives  Does Patient Have a Medical Advance Directive? Yes  Type of Advance Directive [place]  Does patient want to make changes to medical advance directive? No - Patient declined  Copy of [place] in Chart? Yes - validated most recent copy scanned in chart (See row information)     Chief Complaint  Patient presents with   Acute Visit    Patient is being seen for bump on  left inner shin/calf (leg )been there since September, getting bigger   Quality Metric Gaps    Patient is due for second shingrix, Tdap, and covid booster. [facility] verified    HPI: Patient is a 87 y.o. female seen today for an acute visit for left shin hard nodule. She was accompanied by her grandson. Left shin has no bruising. It was reported that she had bumped her left shin a month ago which caused bruising which later on became hard.  The left shin has palpable flat hard area but not visible. She denies pain on the area.  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   aspirin EC 81 MG tablet Take 81 mg by mouth daily. Swallow whole.   Calcium Carbonate (CALCIUM 500 PO) Take 1 capsule by mouth daily.   ciclopirox (LOPROX) 0.77 % SUSP Apply to affected nail every night, removing with nail polish remover once a week.    memantine (NAMENDA) 5 MG tablet Take 1 tablet at bedtime for 2 weeks, then 1 tablet twice daily.   Multiple Vitamin (MULTIVITAMIN) tablet Take 1 tablet by mouth daily.   sodium chloride (MURO 128) 2 % ophthalmic solution Place 1 drop into both eyes daily.   No facility-administered encounter medications on file as of 11/17/2022.    Review of Systems:  Review of Systems  Constitutional:  Negative for appetite change, chills, fatigue and fever.  HENT:  Negative for congestion, hearing loss, rhinorrhea and sore throat.   Eyes: Negative.   Respiratory:  Negative for cough, shortness of breath and wheezing.   Cardiovascular:  Negative for chest pain, palpitations and leg swelling.  Gastrointestinal:  Negative for abdominal pain, constipation, diarrhea, nausea and vomiting.  Genitourinary:  Negative for dysuria.  Musculoskeletal:  Negative for arthralgias, back pain and myalgias.  Skin:  Negative for color change, rash and wound.  Neurological:  Negative for dizziness, weakness and headaches.  Psychiatric/Behavioral:  Negative for behavioral problems. The patient is not nervous/anxious.     Health Maintenance  Topic Date Due   DTaP/Tdap/Td (1 - Tdap) Never done   DEXA SCAN  Never done   COVID-19 Vaccine (3 - Pfizer risk series) 04/19/2020   Zoster Vaccines- Shingrix (2 of 2) 06/01/2020   Medicare Annual Wellness (  AWV)  05/31/2023   Pneumonia Vaccine 9+ Years old  Completed   INFLUENZA VACCINE  Completed   HPV VACCINES  Aged Out    Physical Exam: Vitals:   11/17/22 0936  BP: 136/70  Pulse: 73  Resp: 16  Temp: (!) 97.3 F (36.3 C)  SpO2: 99%  Weight: 126 lb (57.2 kg)  Height: _0  (1.626 m)   Body mass index is 21.63 kg/m. Physical Exam Constitutional:      Appearance: Normal appearance.  HENT:     Head: Normocephalic and atraumatic.     Nose: Nose normal.     Mouth/Throat:     Mouth: Mucous membranes are moist.  Eyes:     Conjunctiva/sclera: Conjunctivae normal.   Cardiovascular:     Rate and Rhythm: Normal rate and regular rhythm.  Pulmonary:     Effort: Pulmonary effort is normal.     Breath sounds: Normal breath sounds.  Abdominal:     General: Bowel sounds are normal.     Palpations: Abdomen is soft.  Musculoskeletal:        General: Normal range of motion.     Cervical back: Normal range of motion.  Skin:    General: Skin is warm and dry.     Comments: Palpable flat nodule on left shin  Neurological:     General: No focal deficit present.     Mental Status: She is alert and oriented to person, place, and time.  Psychiatric:        Mood and Affect: Mood normal.        Behavior: Behavior normal.        Thought Content: Thought content normal.        Judgment: Judgment normal.     Labs reviewed: Basic Metabolic Panel: Recent Labs    02/13/22 1217 05/29/22 1125  NA 141 137  K 4.3 4.9  CL 105 103  CO2 28 27  GLUCOSE 99 90  BUN 20 28*  CREATININE 1.03* 1.13*  CALCIUM 10.0 9.1   Liver Function Tests: Recent Labs    02/13/22 1217 05/29/22 1125  AST 28 23  ALT 22 17  ALKPHOS 82  --   BILITOT 0.8 0.5  PROT 6.5 6.2  ALBUMIN 4.0  --    No results for input(s): "LIPASE", "AMYLASE" in the last 8760 hours. No results for input(s): "AMMONIA" in the last 8760 hours. CBC: Recent Labs    02/13/22 1217 05/29/22 1125  WBC 4.6 4.9  NEUTROABS 2.7 2,783  HGB 11.8* 11.7  HCT 35.8* 35.0  MCV 94.0 91.1  PLT 184 192   Lipid Panel: Recent Labs    05/29/22 1125  CHOL 174  HDL 82  LDLCALC 70  TRIG 135  CHOLHDL 2.1   No results found for: "HGBA1C"  Procedures since last visit: No results found.  Assessment/Plan  1. Shin injury, left, sequela -  bumped into something while going to the bathroom at night -  instructed to apply warm compress to area TID  2. Stage 3a chronic kidney disease ([place]) Lab Results  Component Value Date   NA 137 05/29/2022   K 4.9 05/29/2022   CO2 27 05/29/2022   GLUCOSE 90 05/29/2022    BUN 28 (H) 05/29/2022   CREATININE 1.13 (H) 05/29/2022   CALCIUM 9.1 05/29/2022   EGFR 48 (L) 05/29/2022   GFRNONAA 54 (L) 02/13/2022    -  stable   Labs/tests ordered:  None  Next appt:  03/02/2023

## 2022-12-20 ENCOUNTER — Ambulatory Visit
Admission: RE | Admit: 2022-12-20 | Discharge: 2022-12-20 | Disposition: A | Discharge status: Home / Self Care | Payer: PPO | Source: Ambulatory Visit | Attending: Nurse Practitioner | Admitting: Nurse Practitioner

## 2022-12-20 DIAGNOSIS — M8588 Other specified disorders of bone density and structure, other site: Secondary | ICD-10-CM | POA: Diagnosis not present

## 2022-12-20 DIAGNOSIS — Z780 Asymptomatic menopausal state: Secondary | ICD-10-CM | POA: Diagnosis not present

## 2022-12-20 DIAGNOSIS — E2839 Other primary ovarian failure: Secondary | ICD-10-CM

## 2022-12-20 DIAGNOSIS — M810 Age-related osteoporosis without current pathological fracture: Secondary | ICD-10-CM | POA: Diagnosis not present

## 2022-12-21 ENCOUNTER — Telehealth: Payer: Self-pay | Admitting: *Deleted

## 2022-12-21 ENCOUNTER — Encounter: Payer: Self-pay | Admitting: Nurse Practitioner

## 2022-12-21 ENCOUNTER — Telehealth (INDEPENDENT_AMBULATORY_CARE_PROVIDER_SITE_OTHER): Payer: PPO | Admitting: Nurse Practitioner

## 2022-12-21 DIAGNOSIS — M810 Age-related osteoporosis without current pathological fracture: Secondary | ICD-10-CM

## 2022-12-21 MED ORDER — ALENDRONATE SODIUM 70 MG PO TABS
70.0000 mg | ORAL_TABLET | ORAL | 11 refills | Status: DC
Start: 2022-12-21 — End: 2024-01-10

## 2022-12-21 NOTE — Telephone Encounter (Signed)
Ms. carely, nappier are scheduled for a virtual visit with your provider today.    Just as we do with appointments in the office, we must obtain your consent to participate.  Your consent will be active for this visit and any virtual visit you [name] have with one of our providers in the next 365 days.    If you have a MyChart account, I can also send a copy of this consent to you electronically.  All virtual visits are billed to your insurance company just like a traditional visit in the office.  As this is a virtual visit, video technology does not allow for your provider to perform a traditional examination.  This [name] limit your provider's ability to fully assess your condition.  If your provider identifies any concerns that need to be evaluated in person or the need to arrange testing such as labs, EKG, etc, we will make arrangements to do so.    Although advances in technology are sophisticated, we cannot ensure that it will always work on either your end or our end.  If the connection with a video visit is poor, we [name] have to switch to a telephone visit.  With either a video or telephone visit, we are not always able to ensure that we have a secure connection.   I need to obtain your verbal consent now.   Are you willing to proceed with your visit today?   Alison Brewer has provided verbal consent on 12/21/2022 for a virtual visit (video or telephone).   [name], [place] 12/21/2022  9:02 AM

## 2022-12-21 NOTE — Progress Notes (Signed)
Careteam: Patient Care Team: [name], NP as PCP - General (Geriatric Medicine) Jettie Booze, MD as PCP - Cardiology (Cardiology)  Advanced Directive information Does Patient Have a Medical Advance Directive?: Yes, Type of Advance Directive: [place], Does patient want to make changes to medical advance directive?: No - Patient declined  No Known Allergies  Chief Complaint  Patient presents with   Acute Visit    Discuss Bone Density Results.      HPI: Patient is a 88 y.o. female to discuss bone density.  Osteoporosis noted on bone density.   She is taking 1000 units of vit D daily and calcium 500 mg daily  Also using MVI. Has never been on medication for osteoporosis  No consistent weight bearing exercises.  No hx of fractures.   Review of Systems:  Review of Systems  Constitutional:  Negative for chills and fever.  Gastrointestinal:  Negative for heartburn.  Musculoskeletal:  Negative for myalgias.  Neurological:  Negative for dizziness and headaches.  Psychiatric/Behavioral:  Negative for memory loss.     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 Her smoking use included cigarettes. She has a 27.00 pack-year smoking history. She has never used smokeless tobacco. She reports that she does not drink alcohol and does not use drugs.  Family History  Problem Relation Age of Onset   Pancreatic cancer Mother    Coronary artery disease Father    Cancer Brother    Breast cancer Maternal Aunt     Medications: Patient's Medications  New Prescriptions   No medications on file   Previous Medications   ALBUTEROL (VENTOLIN HFA) 108 (90 BASE) MCG/ACT INHALER    Inhale 1-2 puffs into the lungs every 6 (six) hours as needed for wheezing or shortness of breath.   ASPIRIN EC 81 MG TABLET    Take 81 mg by mouth daily. Swallow whole.   CALCIUM CARBONATE (CALCIUM 500 PO)    Take 1 capsule by mouth daily.   CICLOPIROX (LOPROX) 0.77 % SUSP    Apply to affected nail every night, removing with nail polish remover once a week.   MEMANTINE (NAMENDA) 5 MG TABLET    Take 1 tablet at bedtime for 2 weeks, then 1 tablet twice daily.   MULTIPLE VITAMIN (MULTIVITAMIN) TABLET    Take 1 tablet by mouth daily.   SODIUM CHLORIDE (MURO 128) 2 % OPHTHALMIC SOLUTION    Place 1 drop into both eyes daily.  Modified Medications   No medications on file  Discontinued Medications   No medications on file    Physical Exam:  There were no vitals filed for this visit. There is no height or weight on file to calculate BMI. Wt Readings from Last 3 Encounters:  11/17/22 126 lb (57.2 kg)  09/01/22 126 lb 6.4 oz (57.3 kg)  08/04/22 126 lb (57.2 kg)    Physical Exam Constitutional:      Appearance: Normal appearance.  Pulmonary:     Effort: Pulmonary effort is normal.  Neurological:  Mental Status: She is alert. Mental status is at baseline.  Psychiatric:        Mood and Affect: Mood normal.     Labs reviewed: Basic Metabolic Panel: Recent Labs    02/13/22 1217 05/29/22 1125  NA 141 137  K 4.3 4.9  CL 105 103  CO2 28 27  GLUCOSE 99 90  BUN 20 28*  CREATININE 1.03* 1.13*  CALCIUM 10.0 9.1   Liver Function Tests: Recent Labs    02/13/22 1217 05/29/22 1125  AST 28 23  ALT 22 17  ALKPHOS 82  --   BILITOT 0.8 0.5  PROT 6.5 6.2  ALBUMIN 4.0  --    No results for input(s): "LIPASE", "AMYLASE" in the last 8760 hours. No results for input(s): "AMMONIA" in the last 8760 hours. CBC: Recent Labs    02/13/22 1217 05/29/22 1125  WBC 4.6 4.9  NEUTROABS 2.7 2,783  HGB 11.8*  11.7  HCT 35.8* 35.0  MCV 94.0 91.1  PLT 184 192   Lipid Panel: Recent Labs    05/29/22 1125  CHOL 174  HDL 82  LDLCALC 70  TRIG 135  CHOLHDL 2.1   TSH: No results for input(s): "TSH" in the last 8760 hours. A1C: No results found for: "HGBA1C"   Assessment/Plan 1. Age-related osteoporosis without current pathological fracture -Recommended to take calcium 600 mg twice daily with Vitamin D 2000 units daily and weight bearing activity 30 mins/5 days a week - alendronate (FOSAMAX) 70 MG tablet; Take 1 tablet (70 mg total) by mouth every 7 (seven) days. Take with a full glass of water on an empty stomach.  Dispense: 4 tablet; Refill: 11  Next appt: 03/02/2023 Carlos American. Harle Battiest  [facility] [phone]    Virtual Visit via video mychart   I connected with patient on 12/21/22 at  9:20 AM EST by video and verified that I am speaking with the correct person using two identifiers.  Location: Patient: home Provider: [place]   I discussed the limitations, risks, security and privacy concerns of performing an evaluation and management service by telephone and the availability of in person appointments. I also discussed with the patient that there may be a patient responsible charge related to this service. The patient expressed understanding and agreed to proceed.   I discussed the assessment and treatment plan with the patient. The patient was provided an opportunity to ask questions and all were answered. The patient agreed with the plan and demonstrated an understanding of the instructions.   The patient was advised to call back or seek an in-person evaluation if the symptoms worsen or if the condition fails to improve as anticipated.  I provided 12 minutes of non-face-to-face time during this encounter.  Carlos American. Harle Battiest Avs printed and mailed

## 2022-12-21 NOTE — Progress Notes (Signed)
   This service is provided via telemedicine  No vital signs collected/recorded due to the encounter was a telemedicine visit.   Location of patient (ex: home, work):  Home  Patient consents to a telephone visit:  Yes  Location of the provider (ex: office, home):  [facility]  Name of any referring provider:  N/A  Names of all persons participating in the telemedicine service and their role in the encounter:  Taisia Prindle,patient, Lagena Strand, grandson, Shakur Lembo, [place], Sherrie Mustache, NP  Time spent on call:  8:26

## 2022-12-21 NOTE — Progress Notes (Signed)
Assessment/Plan:    Dementia likely due to Alzheimer's Disease   Alison Brewer is a very pleasant 88 y.o. [name] with a history of arthritis, HTN, Hyperlipidemia seen today in follow up for memory loss. Patient is currently on memantine 5 mg bid . 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 Overall no significant cognitive changes except some difficulty with writing;speech is normal. No parkinsonian signs during exam.     Follow up in 6  months. Continue Memantine '5mg'$  twice daily. Side effects were discussed Continue to control mood as per PCP Recommend good control of cardiovascular risk factors.      Subjective:    This patient is accompanied in the office by her grandson who supplements the history.  Previous records as well as any outside records available were reviewed prior to todays visit. Patient was last seen on  06/02/22. Last MoCA was 12/30 on 04/2022     Any changes in memory since last visit? "She has improved in some areas"; Patient has some difficulty remembering recent conversations and people names.  Her grandson reports a  "decline with writing in  [place], does not make sense- but sometimes she does it well. If she is over thinking, or stressed, she will have more difficulty. Reading is fine". Does not do  crossword puzzles  or word finding. Likes to go to [place]. " STM<   LTM . Comprehension and pronunciation  is good. "Could be better if she uses her hearing aids. No slurring"-grandson says.  repeats oneself?  Endorsed, "has been doing that for years, sometimes is just to double check" .  Disoriented when walking into a room?  Patient denies   Leaving objects in unusual places?  "She is more organized than  before".   Wandering behavior?  denies   Any personality changes since last visit?  denies   Any worsening depression?:  denies   Hallucinations or paranoia?  denies   Seizures?    denies    Any sleep changes?  Denies vivid dreams, REM behavior or sleepwalking, occasionally getting up to go to he bathroom.    Sleep apnea?   denies   Any hygiene concerns?    denies   Independent of bathing and dressing?  Endorsed  Does the patient needs help with medications? grandson is in charge   Who is in charge of the finances? Guardian of the State is in charge     Any changes in appetite?  denies     Patient have trouble swallowing?  denies   Does the patient cook? Yolanda Bonine does the cooking Any kitchen accidents such as leaving the stove on? Patient denies   Any headaches?   denies   Chronic back pain  denies   Ambulates with difficulty? Some difficulty due to arthritis, needs a cane  Recent falls or head injuries? denies     Unilateral weakness, numbness or tingling? denies   Any tremors?  denies   Any anosmia? Endorsed, chronic   Any incontinence of urine?  Endorsed, uses a pad no recent UTI.   Any bowel dysfunction?  denies      Patient lives  with grandson and his wife. Does the patient drive? "My children don't want me to drive"  Initial visit 04/2022  How long did patient have memory difficulties? Every year is worse, but these may have been present for about 15 years, and yet, over the last 10 years has been wears.  Changes have been progressive, but it was brought to her grandsons attention when she could not remember family members birthdays which was unusual for her.  Patient is in denial that there is anything wrong with her.   The person that noticed the biggest changes was her grandson who lives with her.  She is very concerned about numbers, she cannot "put them together ".  In addition, if the topic is changed, she may be "stuck on the prior subject ".  This has been noticed by  myself during this visit as well.   She has had a recent UTI, and dehydration, in which she had acute mental status changes, "she bounced back, except for the numbers ". Patient lives with: Her grandson and his wife from [place], who moved to live with her about 3 years ago.  Her husband died in Mar 28, 2015.   repeats oneself? She always did but is worse over the last 3 y. Disoriented when walking into a room?  Patient denies   Leaving objects in unusual places?  Patient denies.  She is very organized, and she puts the silverware back in the drawer according to her grandson.  However, she likes to hoard, does not like to throw things, although does not living in "weird "places.  Ambulates  with difficulty?   Patient denies. She walks more than before.  She lives in [place], and likes to walk around the house frequently.  Recent falls?  Patient denies   Any head injuries?  Patient denies   History of seizures?   Patient denies   Wandering behavior?  Patient denies   Patient drives?   Patient no longer drives she had a couple of episodes getting lost when going to the pizza place  Any mood changes such irritability agitation?  Patient denies   Any history of depression?:  Patient denies   Hallucinations?  Patient denies   Paranoia?  Patient denies   Patient reports that he sleeps well without vivid dreams, REM behavior or sleepwalking   History of sleep apnea?  Patient denies   Any hygiene concerns?  Patient denies   Independent of bathing and dressing?  Endorsed  Does the patient needs help with medications?  Patient denies.  She never took many medications. Who is in charge of the finances?  Stepchildren  Patient have trouble swallowing? Patient denies   Does the patient cook? Her grandson does  Any kitchen accidents such as leaving the stove on? Patient denies   Any headaches?  Patient denies   The double vision? Patient denies   Any focal numbness or tingling?  " I am not sure  " Chronic back pain Patient denies   Unilateral weakness?  Patient denies   Any tremors?  Patient denies   Any history of anosmia? Decrease in the sense of smell , and decreased hearing  Any incontinence of urine? Endorses incontinence  Patient had a recent E. Coli UTI  Any bowel dysfunction?   Patient occasionally uses prunes  History of heavy alcohol intake?  Patient denies   History of heavy tobacco use?  Patient denies   Family history of dementia?  father may have had, Alzheimer's disease     Labs 02/22/22 TSH 2.301, B12 1381 PREVIOUS MEDICATIONS:   CURRENT MEDICATIONS:  Outpatient Encounter Medications as of 12/22/2022  Medication Sig   albuterol (VENTOLIN HFA) 108 (90 Base) MCG/ACT inhaler Inhale 1-2 puffs into the lungs every 6 (six) hours as needed for wheezing or shortness of breath.   alendronate (FOSAMAX) 70 MG tablet Take 1 tablet (70 mg total) by mouth every 7 (seven) days. Take with a full glass of water on an empty stomach.   aspirin EC 81 MG tablet Take 81 mg by mouth daily. Swallow whole.   Calcium Carbonate (CALCIUM 500 PO) Take 1 capsule by mouth daily.   ciclopirox (LOPROX) 0.77 % SUSP Apply to affected nail every night, removing with nail polish remover once a week.   memantine (NAMENDA) 5 MG tablet Take 1 tablet at bedtime for 2 weeks, then 1 tablet twice daily.   Multiple Vitamin (MULTIVITAMIN) tablet Take 1 tablet by mouth daily.   sodium chloride (MURO 128) 2 % ophthalmic solution Place 1 drop into both eyes daily.   No facility-administered encounter medications on file as of 12/22/2022.        No data to display            05/05/2022   10:00 AM  Montreal Cognitive Assessment   Visuospatial/ Executive (0/5) 0  Naming (0/3) 3  Attention: Read list of digits (0/2) 2  Attention: Read list of letters (0/1) 1  Attention: Serial 7 subtraction starting at 100 (0/3) 0  Language: Repeat phrase (0/2) 1  Language : Fluency (0/1) 0  Abstraction (0/2) 0  Delayed  Recall (0/5) 0  Orientation (0/6) 4  Total 11  Adjusted Score (based on education) 12    Objective:     PHYSICAL EXAMINATION:    VITALS:   Vitals:   12/22/22 1121  BP: (!) 173/74  Pulse: 67  Resp: 20  SpO2: 100%  Weight: 123 lb (55.8 kg)  Height: '5\' 4"'$  (1.626 m)    GEN:  The patient appears stated age and is in NAD. HEENT:  Normocephalic, atraumatic.   Neurological examination:  General: NAD, well-groomed, appears stated age. Orientation: The patient is alert. Oriented to person, place and not to date Cranial nerves: There is good facial symmetry.The speech is fluent and clear, tangential at times. No aphasia or dysarthria. Fund of knowledge is appropriate. Recent and remote memory are impaired. Attention and concentration are reduced.  Able to name objects and repeat phrases.  Hearing is intact to conversational tone. She does show some difficulty trying to write a sentence.     Sensation: Sensation is intact to light touch throughout Motor: Strength is at least antigravity x4. DTR's 2/4 in UE/LE     Movement examination: Tone: There is normal tone in the UE/LE Abnormal movements:  no tremor.  No myoclonus.  No asterixis.   Coordination:  There is no decremation with RAM's. Normal finger to nose  Gait and Station: The patient has some difficulty arising out of a deep-seated chair without the use of the hands, needs a cane. The patient's stride length is good.  Gait is cautious and narrow.    Thank you for allowing [place] the opportunity to participate in the care of this nice patient. Please do not hesitate to contact us for any questions or concerns.   Total time spent on today's visit was 35 minutes dedicated to this patient today, preparing to see patient, examining the patient, ordering tests and/or medications and counseling the patient, documenting clinical information in the EHR or other health record, independently interpreting results  and communicating results to the  patient/family, discussing treatment and goals, answering patient's questions and coordinating care.  Cc:  [name], NP  Sharene Butters 12/22/2022 7:18 PM

## 2022-12-22 ENCOUNTER — Encounter: Payer: Self-pay | Admitting: Physician Assistant

## 2022-12-22 ENCOUNTER — Ambulatory Visit (INDEPENDENT_AMBULATORY_CARE_PROVIDER_SITE_OTHER): Payer: PPO | Admitting: Physician Assistant

## 2022-12-22 VITALS — BP 173/74 | HR 67 | Resp 20 | Ht 64.0 in | Wt 123.0 lb

## 2022-12-22 DIAGNOSIS — G309 Alzheimer's disease, unspecified: Secondary | ICD-10-CM

## 2022-12-22 DIAGNOSIS — F0280 Dementia in other diseases classified elsewhere without behavioral disturbance: Secondary | ICD-10-CM

## 2022-12-22 NOTE — Patient Instructions (Signed)
It was a pleasure to see you today at our office.   Recommendations:  Follow up in 6 months  Continue  Memantine '5mg'$  tablets  1 tablet twice daily.     Whom to call:  Memory  decline, memory medications: Call our office [phone]   For psychiatric meds, mood meds: Please have your primary care physician manage these medications.   Counseling regarding caregiver distress, including caregiver depression, anxiety and issues regarding community resources, adult day care programs, adult living facilities, or memory care questions:   Feel free to contact [place], Social Worker at [phone]   For assessment of decision of mental capacity and competency:  Call Dr. Anthoney Harada, geriatric psychiatrist at [phone]  For guidance in geriatric dementia issues please call [facility] [phone]  For guidance regarding [facility] and if placement were needed at the facility, contact Arnell Asal, Social Worker tel: [phone]  If you have any severe symptoms of a stroke, or other severe issues such as confusion,severe chills or fever, etc call 911 or go to the ER as you may need to be evaluated further   Feel free to visit Facebook page " [email or web address]" for tips of how to care for people with memory problems.         RECOMMENDATIONS FOR ALL PATIENTS WITH MEMORY PROBLEMS: 1. Continue to exercise (Recommend 30 minutes of walking everyday, or 3 hours every week) 2. Increase social interactions - continue going to [place] and enjoy social gatherings with friends and family 3. Eat healthy, avoid fried foods and eat more fruits and vegetables 4. Maintain adequate blood pressure, blood sugar, and blood cholesterol level. Reducing the risk of stroke and cardiovascular disease also helps promoting better memory. 5. Avoid stressful situations. Live a simple life and avoid aggravations. Organize your time and prepare for the next day in  anticipation. 6. Sleep well, avoid any interruptions of sleep and avoid any distractions in the bedroom that may interfere with adequate sleep quality 7. Avoid sugar, avoid sweets as there is a strong link between excessive sugar intake, diabetes, and cognitive impairment We discussed the Mediterranean diet, which has been shown to help patients reduce the risk of progressive memory disorders and reduces cardiovascular risk. This includes eating fish, eat fruits and green leafy vegetables, nuts like almonds and hazelnuts, walnuts, and also use olive oil. Avoid fast foods and fried foods as much as possible. Avoid sweets and sugar as sugar use has been linked to worsening of memory function.  There is always a concern of gradual progression of memory problems. If this is the case, then we may need to adjust level of care according to patient needs. Support, both to the patient and caregiver, should then be put into place.    The Alzheimer's Association is here all day, every day for people facing Alzheimer's disease through our free 24/7 Helpline: [phone]. The Helpline provides reliable information and support to all those who need assistance, such as individuals living with memory loss, Alzheimer's or other dementia, caregivers, health care professionals and the public.  Our highly trained and knowledgeable staff can help you with: Understanding memory loss, dementia and Alzheimer's  Medications and other treatment options  General information about aging and brain health  Skills to provide quality care and to find the best care from professionals  Legal, financial and living-arrangement decisions Our Helpline also features: Confidential care consultation provided by master's level clinicians who can help with decision-making support, crisis  assistance and education on issues families face every day  Help in a caller's preferred language using our translation service that features more than 200  languages and dialects  Referrals to local community programs, services and ongoing support     FALL PRECAUTIONS: Be cautious when walking. Scan the area for obstacles that may increase the risk of trips and falls. When getting up in the mornings, sit up at the edge of the bed for a few minutes before getting out of bed. Consider elevating the bed at the head end to avoid drop of blood pressure when getting up. Walk always in a well-lit room (use night lights in the walls). Avoid area rugs or power cords from appliances in the middle of the walkways. Use a walker or a cane if necessary and consider physical therapy for balance exercise. Get your eyesight checked regularly.  FINANCIAL OVERSIGHT: Supervision, especially oversight when making financial decisions or transactions is also recommended.  HOME SAFETY: Consider the safety of the kitchen when operating appliances like stoves, microwave oven, and blender. Consider having supervision and share cooking responsibilities until no longer able to participate in those. Accidents with firearms and other hazards in the house should be identified and addressed as well.   ABILITY TO BE LEFT ALONE: If patient is unable to contact 911 operator, consider using LifeLine, or when the need is there, arrange for someone to stay with patients. Smoking is a fire hazard, consider supervision or cessation. Risk of wandering should be assessed by caregiver and if detected at any point, supervision and safe proof recommendations should be instituted.  MEDICATION SUPERVISION: Inability to self-administer medication needs to be constantly addressed. Implement a mechanism to ensure safe administration of the medications.   DRIVING: Regarding driving, in patients with progressive memory problems, driving will be impaired. We advise to have someone else do the driving if trouble finding directions or if minor accidents are reported. Independent driving assessment is  available to determine safety of driving.   If you are interested in the driving assessment, you can contact the following:  [employer] in [place]  [place] [phone] or [phone]      [place] refers to food and lifestyle choices that are based on the traditions of countries located on the [employer]. This way of eating has been shown to help prevent certain conditions and improve outcomes for people who have chronic diseases, like kidney disease and heart disease. What are tips for following this plan? Lifestyle  Cook and eat meals together with your family, when possible. Drink enough fluid to keep your urine clear or pale yellow. Be physically active every day. This includes: Aerobic exercise like running or swimming. Leisure activities like gardening, walking, or housework. Get 7-8 hours of sleep each night. If recommended by your health care provider, drink red wine in moderation. This means 1 glass a day for nonpregnant women and 2 glasses a day for men. A glass of wine equals 5 oz (150 mL). Reading food labels  Check the serving size of packaged foods. For foods such as rice and pasta, the serving size refers to the amount of cooked product, not dry. Check the total fat in packaged foods. Avoid foods that have saturated fat or trans fats. Check the ingredients list for added sugars, such as corn syrup. Shopping  At the grocery store, buy most of your food from  the areas near the walls of the store. This includes: Fresh fruits and vegetables (produce). Grains, beans, nuts, and seeds. Some of these may be available in unpackaged forms or large amounts (in bulk). Fresh seafood. Poultry and eggs. Low-fat dairy products. Buy whole ingredients instead of prepackaged foods. Buy fresh fruits and vegetables in-season  from local farmers markets. Buy frozen fruits and vegetables in resealable bags. If you do not have access to quality fresh seafood, buy precooked frozen shrimp or canned fish, such as tuna, salmon, or sardines. Buy small amounts of raw or cooked vegetables, salads, or olives from the deli or salad bar at your store. Stock your pantry so you always have certain foods on hand, such as olive oil, canned tuna, canned tomatoes, rice, pasta, and beans. Cooking  Cook foods with extra-virgin olive oil instead of using butter or other vegetable oils. Have meat as a side dish, and have vegetables or grains as your main dish. This means having meat in small portions or adding small amounts of meat to foods like pasta or stew. Use beans or vegetables instead of meat in common dishes like chili or lasagna. Experiment with different cooking methods. Try roasting or broiling vegetables instead of steaming or sauteing them. Add frozen vegetables to soups, stews, pasta, or rice. Add nuts or seeds for added healthy fat at each meal. You can add these to yogurt, salads, or vegetable dishes. Marinate fish or vegetables using olive oil, lemon juice, garlic, and fresh herbs. Meal planning  Plan to eat 1 vegetarian meal one day each week. Try to work up to 2 vegetarian meals, if possible. Eat seafood 2 or more times a week. Have healthy snacks readily available, such as: Vegetable sticks with hummus. Greek yogurt. Fruit and nut trail mix. Eat balanced meals throughout the week. This includes: Fruit: 2-3 servings a day Vegetables: 4-5 servings a day Low-fat dairy: 2 servings a day Fish, poultry, or lean meat: 1 serving a day Beans and legumes: 2 or more servings a week Nuts and seeds: 1-2 servings a day Whole grains: 6-8 servings a day Extra-virgin olive oil: 3-4 servings a day Limit red meat and sweets to only a few servings a month What are my food choices? Mediterranean diet Recommended Grains:  Whole-grain pasta. Brown rice. Bulgar wheat. Polenta. Couscous. Whole-wheat bread. [place]. Vegetables: Artichokes. Beets. Broccoli. Cabbage. Carrots. Eggplant. Green beans. Chard. Kale. Spinach. Onions. Leeks. Peas. Squash. Tomatoes. Peppers. Radishes. Fruits: Apples. Apricots. Avocado. Berries. Bananas. Cherries. Dates. Figs. Grapes. Lemons. Melon. Oranges. Peaches. Plums. Pomegranate. Meats and other protein foods: Beans. Almonds. Sunflower seeds. Pine nuts. Peanuts. [place]. Salmon. Scallops. Shrimp. Milo. Tilapia. Clams. Oysters. Eggs. Dairy: Low-fat milk. Cheese. Greek yogurt. Beverages: Water. Red wine. Herbal tea. Fats and oils: Extra virgin olive oil. Avocado oil. Grape seed oil. Sweets and desserts: [place] yogurt with honey. Baked apples. Poached pears. Trail mix. Seasoning and other foods: Basil. Cilantro. Coriander. Cumin. Mint. Parsley. Sage. Rosemary. Tarragon. Garlic. Oregano. Thyme. Pepper. Balsalmic vinegar. Tahini. Hummus. Tomato sauce. Olives. Mushrooms. Limit these Grains: Prepackaged pasta or rice dishes. Prepackaged cereal with added sugar. Vegetables: Deep fried potatoes (french fries). Fruits: Fruit canned in syrup. Meats and other protein foods: Beef. Pork. Lamb. Poultry with skin. Hot dogs. [place] Salines. Dairy: Ice cream. Sour cream. Whole milk. Beverages: Juice. Sugar-sweetened soft drinks. Beer. Liquor and spirits. Fats and oils: Butter. [place]ola oil. Vegetable oil. Beef fat (tallow). Lard. Sweets and desserts: Cookies. Cakes. Pies. Candy. Seasoning and other foods: Mayonnaise. Premade sauces  and marinades. The items listed may not be a complete list. Talk with your dietitian about what dietary choices are right for you. Summary The Mediterranean diet includes both food and lifestyle choices. Eat a variety of fresh fruits and vegetables, beans, nuts, seeds, and whole grains. Limit the amount of red meat and sweets that you eat. Talk with your health care provider about  whether it is safe for you to drink red wine in moderation. This means 1 glass a day for nonpregnant women and 2 glasses a day for men. A glass of wine equals 5 oz (150 mL). This information is not intended to replace advice given to you by your health care provider. Make sure you discuss any questions you have with your health care provider. Document Released: 07/27/2016 Document Revised: 08/29/2016 Document Reviewed: 07/27/2016 Elsevier Interactive Patient Education  2017 [employer].

## 2023-01-02 ENCOUNTER — Ambulatory Visit (INDEPENDENT_AMBULATORY_CARE_PROVIDER_SITE_OTHER): Payer: PPO | Admitting: Dermatology

## 2023-01-02 ENCOUNTER — Encounter: Payer: Self-pay | Admitting: Dermatology

## 2023-01-02 DIAGNOSIS — D485 Neoplasm of uncertain behavior of skin: Secondary | ICD-10-CM

## 2023-01-02 DIAGNOSIS — L814 Other melanin hyperpigmentation: Secondary | ICD-10-CM | POA: Diagnosis not present

## 2023-01-02 DIAGNOSIS — B079 Viral wart, unspecified: Secondary | ICD-10-CM | POA: Diagnosis not present

## 2023-01-02 DIAGNOSIS — L821 Other seborrheic keratosis: Secondary | ICD-10-CM | POA: Diagnosis not present

## 2023-01-02 DIAGNOSIS — Z85828 Personal history of other malignant neoplasm of skin: Secondary | ICD-10-CM

## 2023-01-02 DIAGNOSIS — Z1283 Encounter for screening for malignant neoplasm of skin: Secondary | ICD-10-CM | POA: Diagnosis not present

## 2023-01-02 DIAGNOSIS — L570 Actinic keratosis: Secondary | ICD-10-CM

## 2023-01-02 DIAGNOSIS — L304 Erythema intertrigo: Secondary | ICD-10-CM | POA: Diagnosis not present

## 2023-01-02 DIAGNOSIS — L578 Other skin changes due to chronic exposure to nonionizing radiation: Secondary | ICD-10-CM

## 2023-01-02 DIAGNOSIS — L603 Nail dystrophy: Secondary | ICD-10-CM

## 2023-01-02 MED ORDER — HYDROCORTISONE 2.5 % EX CREA: TOPICAL_CREAM | CUTANEOUS | 2 refills | Status: DC

## 2023-01-02 NOTE — Progress Notes (Signed)
Follow-Up Visit   Subjective  Alison Brewer is a 88 y.o. female who presents for the following: Annual Exam.  The patient presents for Total-Body Skin Exam (TBSE) for skin cancer screening and mole check.  The patient has spots, moles and lesions to be evaluated, some may be new or changing. She has a hypertrophic AK of the left 4th PIP to recheck that was treated with cryotherapy last visit. 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 She has itching of the upper medial thighs. She uses an OTC cream that helps Patient accompanied by grandson, Josh.  The following portions of the chart were reviewed this encounter and updated as appropriate:       Review of Systems:  No other skin or systemic complaints except as noted in HPI or Assessment and Plan.  Objective  Well appearing patient in no apparent distress; mood and affect are within normal limits.  A full examination was performed including scalp, head, eyes, ears, nose, lips, neck, chest, axillae, abdomen, back, buttocks, bilateral upper extremities, bilateral lower extremities, hands, feet, fingers, toes, fingernails, and toenails. All findings within normal limits unless otherwise noted below.  groin, medial thighs Clear today. But pt states itches off and on  thumb nails Hyperlinear nailplates of thumb R > L with mild discoloration  Left 4th PIP 4.20m keratotic papule  R malar cheek Keratotic macule    Assessment & Plan  Skin cancer screening performed today.  Actinic Damage - chronic, secondary to cumulative UV radiation exposure/sun exposure over time - diffuse scaly erythematous macules with underlying dyspigmentation - Recommend daily broad spectrum sunscreen SPF 30+ to sun-exposed areas, reapply every 2 hours as needed.  - Recommend staying in the shade or wearing long sleeves, sun glasses (UVA+UVB protection) and wide brim hats (4-inch brim around the entire  circumference of the hat). - Call for new or changing lesions.  Lentigines - Scattered tan macules - Due to sun exposure - Benign-appearing, observe - Recommend daily broad spectrum sunscreen SPF 30+ to sun-exposed areas, reapply every 2 hours as needed. - Call for any changes  Seborrheic Keratoses - Stuck-on, waxy, tan-brown papules and/or plaques  - Benign-appearing - Discussed benign etiology and prognosis. - Observe - Call for any changes  History of Squamous Cell Carcinoma of the Skin - No evidence of recurrence today of the left calf inferior - Recommend regular full body skin exams - Recommend daily broad spectrum sunscreen SPF 30+ to sun-exposed areas, reapply every 2 hours as needed.  - Call if any new or changing lesions are noted between office visits  History of Skin Cancer - right upper elbow  Clear. Observe for recurrence.  Call clinic for new or changing lesions.   Recommend regular skin exams, daily broad-spectrum spf 30+ sunscreen use, and photoprotection.     Erythema intertrigo groin, medial thighs  Chronic and persistent condition with duration or expected duration over one year. Condition is symptomatic/ bothersome to patient. Not currently at goal. Still has itching.  Intertrigo is a chronic recurrent rash that occurs in skin fold areas that may be associated with friction; heat; moisture; yeast; fungus; and bacteria.  It is exacerbated by increased movement / activity; sweating; and higher atmospheric temperature.  Start hydrocortisone 2.5% cream Apply to affected areas groin, medial thighs one to two times a day prn itch dsp 30g 2Rf.   Start Zeasorb AF Powder Apply to groin,  medial thighs once a day after shower.  hydrocortisone 2.5 % cream - groin, medial thighs Apply to affected areas itching in the groin, inner thighs once to twice daily as needed.  Nail dystrophy thumb nails  Doubt fungal.  May be due to dry nails.  Recommend thick  moisturizer to nails 1-2x daily. Mild soap with handwashing  Neoplasm of uncertain behavior of skin Left 4th PIP  Epidermal / dermal shaving  Lesion diameter (cm):  0.5 Informed consent: discussed and consent obtained   Patient was prepped and draped in usual sterile fashion: Area prepped with alcohol. Anesthesia: the lesion was anesthetized in a standard fashion   Anesthetic:  1% lidocaine w/ epinephrine 1-100,000 buffered w/ 8.4% NaHCO3 Instrument used: flexible razor blade   Hemostasis achieved with: pressure, aluminum chloride and electrodesiccation   Outcome: patient tolerated procedure well   Post-procedure details: wound care instructions given   Post-procedure details comment:  Ointment and small bandage applied  Specimen 1 - Surgical pathology Differential Diagnosis: Hypertrophic AK vs Wart r/o SCC Check Margins: Yes  Hypertrophic actinic keratosis R malar cheek  Actinic keratoses are precancerous spots that appear secondary to cumulative UV radiation exposure/sun exposure over time. They are chronic with expected duration over 1 year. A portion of actinic keratoses will progress to squamous cell carcinoma of the skin. It is not possible to reliably predict which spots will progress to skin cancer and so treatment is recommended to prevent development of skin cancer.  Recommend daily broad spectrum sunscreen SPF 30+ to sun-exposed areas, reapply every 2 hours as needed.  Recommend staying in the shade or wearing long sleeves, sun glasses (UVA+UVB protection) and wide brim hats (4-inch brim around the entire circumference of the hat). Call for new or changing lesions.  Destruction of lesion - R malar cheek  Destruction method: cryotherapy   Informed consent: discussed and consent obtained   Lesion destroyed using liquid nitrogen: Yes   Region frozen until ice ball extended beyond lesion: Yes   Outcome: patient tolerated procedure well with no complications    Post-procedure details: wound care instructions given   Additional details:  Prior to procedure, discussed risks of blister formation, small wound, skin dyspigmentation, or rare scar following cryotherapy. Recommend Vaseline ointment to treated areas while healing.    Spider Veins - Dilated blue, purple or red veins at the lower extremities of the feet - Reassured - Smaller vessels can be treated by sclerotherapy (a procedure to inject a medicine into the veins to make them disappear) if desired, but the treatment is not covered by insurance. Larger vessels may be covered if symptomatic and we would refer to vascular surgeon if treatment desired.  Return in about 1 year (around 01/03/2024) for TBSE, Hx SCC.  [name], CMA, am acting as scribe for [name], MD .  Documentation: I have reviewed the above documentation for accuracy and completeness, and I agree with the above.  [name] MD

## 2023-01-02 NOTE — Patient Instructions (Addendum)
Start hydrocortisone 2.5% cream Apply to groin, medial thighs once to twice daily as needed for itch. Not to be used every day.   Start Zeasorb AF Powder Apply to groin, medial thighs once a day after shower.  Recommend thick moisturizer to nails daily.   Cryotherapy Aftercare  Wash gently with soap and water everyday.  (Right cheek) Apply Vaseline and Band-Aid daily until healed.   Wound Care Instructions  Cleanse wound gently with soap and water once a day then pat dry with clean gauze. Apply a thin coat of Petrolatum (petroleum jelly, "Vaseline") over the wound (unless you have an allergy to this). We recommend that you use a new, sterile tube of Vaseline. Do not pick or remove scabs. Do not remove the yellow or white "healing tissue" from the base of the wound.  Cover the wound with fresh, clean, nonstick gauze and secure with paper tape. You may use Band-Aids in place of gauze and tape if the wound is small enough, but would recommend trimming much of the tape off as there is often too much. Sometimes Band-Aids can irritate the skin.  You should call the office for your biopsy report after 1 week if you have not already been contacted.  If you experience any problems, such as abnormal amounts of bleeding, swelling, significant bruising, significant pain, or evidence of infection, please call the office immediately.  FOR ADULT SURGERY PATIENTS: If you need something for pain relief you may take 1 extra strength Tylenol (acetaminophen) AND 2 Ibuprofen ('200mg'$  each) together every 4 hours as needed for pain. (do not take these if you are allergic to them or if you have a reason you should not take them.) Typically, you may only need pain medication for 1 to 3 days.     Seborrheic Keratosis  What causes seborrheic keratoses? Seborrheic keratoses are harmless, common skin growths that first appear during adult life.  As time goes by, more growths appear.  Some people may develop a large  number of them.  Seborrheic keratoses appear on both covered and uncovered body parts.  They are not caused by sunlight.  The tendency to develop seborrheic keratoses can be inherited.  They vary in color from skin-colored to gray, brown, or even black.  They can be either smooth or have a rough, warty surface.   Seborrheic keratoses are superficial and look as if they were stuck on the skin.  Under the microscope this type of keratosis looks like layers upon layers of skin.  That is why at times the top layer may seem to fall off, but the rest of the growth remains and re-grows.    Treatment Seborrheic keratoses do not need to be treated, but can easily be removed in the office.  Seborrheic keratoses often cause symptoms when they rub on clothing or jewelry.  Lesions can be in the way of shaving.  If they become inflamed, they can cause itching, soreness, or burning.  Removal of a seborrheic keratosis can be accomplished by freezing, burning, or surgery. If any spot bleeds, scabs, or grows rapidly, please return to have it checked, as these can be an indication of a skin cancer.   Due to recent changes in healthcare laws, you may see results of your pathology and/or laboratory studies on MyChart before the doctors have had a chance to review them. We understand that in some cases there may be results that are confusing or concerning to you. Please understand that not all results  are received at the same time and often the doctors may need to interpret multiple results in order to provide you with the best plan of care or course of treatment. Therefore, we ask that you please give [place] 2 business days to thoroughly review all your results before contacting the office for clarification. Should we see a critical lab result, you will be contacted sooner.   If You Need Anything After Your Visit  If you have any questions or concerns for your doctor, please call our main line at [phone] and press option 4  to reach your doctor's medical assistant. If no one answers, please leave a voicemail as directed and we will return your call as soon as possible. Messages left after 4 pm will be answered the following business day.   You may also send [place] a message via [place]. We typically respond to MyChart messages within 1-2 business days.  For prescription refills, please ask your pharmacy to contact our office. Our fax number is [phone].  If you have an urgent issue when the clinic is closed that cannot wait until the next business day, you can page your doctor at the number below.    Please note that while we do our best to be available for urgent issues outside of office hours, we are not available 24/7.   If you have an urgent issue and are unable to reach [place], you may choose to seek medical care at your doctor's office, retail clinic, urgent care center, or emergency room.  If you have a medical emergency, please immediately call 911 or go to the emergency department.  Pager Numbers  - Dr. Nehemiah Massed: [phone]  - Dr. Laurence Ferrari: [phone]  - Dr. Nicole Kindred: [phone]  In the event of inclement weather, please call our main line at [phone] for an update on the status of any delays or closures.  Dermatology Medication Tips: Please keep the boxes that topical medications come in in order to help keep track of the instructions about where and how to use these. Pharmacies typically print the medication instructions only on the boxes and not directly on the medication tubes.   If your medication is too expensive, please contact our office at [phone] option 4 or send [place] a message through [place].   We are unable to tell what your co-pay for medications will be in advance as this is different depending on your insurance coverage. However, we may be able to find a substitute medication at lower cost or fill out paperwork to get insurance to cover a needed medication.   If a prior  authorization is required to get your medication covered by your insurance company, please allow [place] 1-2 business days to complete this process.  Drug prices often vary depending on where the prescription is filled and some pharmacies may offer cheaper prices.  The website www.goodrx.com contains coupons for medications through different pharmacies. The prices here do not account for what the cost may be with help from insurance (it may be cheaper with your insurance), but the website can give you the price if you did not use any insurance.  - You can print the associated coupon and take it with your prescription to the pharmacy.  - You may also stop by our office during regular business hours and pick up a GoodRx coupon card.  - If you need your prescription sent electronically to a different pharmacy, notify our office through [facility] or by phone at [phone] option  4.     Si Usted Necesita Algo Despus de Su Visita  Tambin puede enviarnos un mensaje a travs de Pharmacist, community. Por lo general respondemos a los mensajes de MyChart en el transcurso de 1 a 2 das hbiles.  Para renovar recetas, por favor pida a su farmacia que se ponga en contacto con nuestra oficina. Harland Dingwall de fax es [place] [phone].  Si tiene un asunto urgente cuando la clnica est cerrada y que no puede esperar hasta el siguiente da hbil, puede llamar/localizar a su doctor(a) al nmero que aparece a continuacin.   Por favor, tenga en cuenta que aunque hacemos todo lo posible para estar disponibles para asuntos urgentes fuera del horario de [place], no estamos disponibles las 24 horas del da, los 7 das de la [place].   Si tiene un problema urgente y no puede comunicarse con nosotros, puede optar por buscar atencin mdica  en el consultorio de su doctor(a), en una clnica privada, en un centro de atencin urgente o en una sala de emergencias.  Si tiene Engineering geologist, por favor llame  inmediatamente al 911 o vaya a la sala de emergencias.  Nmeros de bper  - Dr. Nehemiah Massed: [phone]  - Dra. Moye: [phone]  - Dra. Nicole Kindred: [phone]  En caso de inclemencias del [place], por favor llame a Johnsie Kindred principal al [phone] para una actualizacin sobre el [place] de cualquier retraso o cierre.  Consejos para la medicacin en dermatologa: Por favor, guarde las cajas en las que vienen los medicamentos de uso tpico para ayudarle a seguir las instrucciones sobre dnde y cmo usarlos. Las farmacias generalmente imprimen las instrucciones del medicamento slo en las cajas y no directamente en los tubos del [place].   Si su medicamento es muy caro, por favor, pngase en contacto con Zigmund Daniel llamando al [phone] y presione la opcin 4 o envenos un mensaje a travs de Pharmacist, community.   No podemos decirle cul ser su copago por los medicamentos por adelantado ya que esto es diferente dependiendo de la cobertura de su seguro. Sin embargo, es posible que podamos encontrar un medicamento sustituto a Electrical engineer un formulario para que el seguro cubra el medicamento que se considera necesario.   Si se requiere una autorizacin previa para que su compaa de seguros [place] su medicamento, por favor permtanos de 1 a 2 das hbiles para completar este proceso.  Los precios de los medicamentos varan con frecuencia dependiendo del Environmental consultant de dnde se surte la receta y alguna farmacias pueden ofrecer precios ms baratos.  El sitio web www.goodrx.com tiene cupones para medicamentos de Airline pilot. Los precios aqu no tienen en cuenta lo que podra costar con la ayuda del seguro (puede ser ms barato con su seguro), pero el sitio web puede darle el precio si no utiliz Research scientist (physical sciences).  - Puede imprimir el cupn correspondiente y llevarlo con su receta a la farmacia.  - Tambin puede pasar por nuestra oficina durante el horario de atencin regular y Charity fundraiser  una tarjeta de cupones de GoodRx.  - Si necesita que su receta se enve electrnicamente a una farmacia diferente, informe a nuestra oficina a travs de MyChart de [place] o por telfono llamando al [phone] y presione la opcin 4.

## 2023-01-08 ENCOUNTER — Telehealth: Payer: Self-pay

## 2023-01-08 NOTE — Telephone Encounter (Signed)
Advised patient's grandson, Merrily Pew, biopsy was a benign wart. Cryotherapy if recurs.

## 2023-01-08 NOTE — Telephone Encounter (Signed)
-----  Message from [name], MD sent at 01/08/2023  8:44 AM EST ----- Skin , left 4th PIP VERRUCA VULGARIS, IRRITATED  Benign irritated wart, cryotherapy if it recurs- please call patient

## 2023-01-09 ENCOUNTER — Encounter (HOSPITAL_BASED_OUTPATIENT_CLINIC_OR_DEPARTMENT_OTHER): Payer: Self-pay | Admitting: Radiology

## 2023-01-09 ENCOUNTER — Emergency Department (HOSPITAL_BASED_OUTPATIENT_CLINIC_OR_DEPARTMENT_OTHER): Payer: PPO

## 2023-01-09 ENCOUNTER — Emergency Department (HOSPITAL_BASED_OUTPATIENT_CLINIC_OR_DEPARTMENT_OTHER)
Admission: EM | Admit: 2023-01-09 | Discharge: 2023-01-09 | Disposition: A | Discharge status: Home / Self Care | Payer: PPO | Attending: Emergency Medicine | Admitting: Emergency Medicine

## 2023-01-09 ENCOUNTER — Other Ambulatory Visit: Payer: Self-pay

## 2023-01-09 DIAGNOSIS — W0110XA Fall on same level from slipping, tripping and stumbling with subsequent striking against unspecified object, initial encounter: Secondary | ICD-10-CM | POA: Diagnosis not present

## 2023-01-09 DIAGNOSIS — H6122 Impacted cerumen, left ear: Secondary | ICD-10-CM | POA: Insufficient documentation

## 2023-01-09 DIAGNOSIS — Z7982 Long term (current) use of aspirin: Secondary | ICD-10-CM | POA: Insufficient documentation

## 2023-01-09 DIAGNOSIS — F0390 Unspecified dementia without behavioral disturbance: Secondary | ICD-10-CM | POA: Diagnosis not present

## 2023-01-09 DIAGNOSIS — S0181XA Laceration without foreign body of other part of head, initial encounter: Secondary | ICD-10-CM | POA: Diagnosis not present

## 2023-01-09 DIAGNOSIS — S0083XA Contusion of other part of head, initial encounter: Secondary | ICD-10-CM | POA: Diagnosis not present

## 2023-01-09 DIAGNOSIS — S0990XA Unspecified injury of head, initial encounter: Secondary | ICD-10-CM | POA: Diagnosis not present

## 2023-01-09 DIAGNOSIS — W19XXXA Unspecified fall, initial encounter: Secondary | ICD-10-CM | POA: Diagnosis not present

## 2023-01-09 DIAGNOSIS — S01112A Laceration without foreign body of left eyelid and periocular area, initial encounter: Secondary | ICD-10-CM

## 2023-01-09 DIAGNOSIS — Z23 Encounter for immunization: Secondary | ICD-10-CM | POA: Diagnosis not present

## 2023-01-09 DIAGNOSIS — T1490XA Injury, unspecified, initial encounter: Secondary | ICD-10-CM | POA: Diagnosis not present

## 2023-01-09 DIAGNOSIS — R9431 Abnormal electrocardiogram [ECG] [EKG]: Secondary | ICD-10-CM | POA: Diagnosis not present

## 2023-01-09 DIAGNOSIS — I10 Essential (primary) hypertension: Secondary | ICD-10-CM | POA: Diagnosis not present

## 2023-01-09 LAB — CBC WITH DIFFERENTIAL/PLATELET
Abs Immature Granulocytes: 0.02 10*3/uL (ref 0.00–0.07)
Basophils Absolute: 0 10*3/uL (ref 0.0–0.1)
Basophils Relative: 1 %
Eosinophils Absolute: 0.1 10*3/uL (ref 0.0–0.5)
Eosinophils Relative: 2 %
HCT: 36.8 % (ref 36.0–46.0)
Hemoglobin: 12.2 g/dL (ref 12.0–15.0)
Immature Granulocytes: 0 %
Lymphocytes Relative: 22 %
Lymphs Abs: 1.1 10*3/uL (ref 0.7–4.0)
MCH: 30.9 pg (ref 26.0–34.0)
MCHC: 33.2 g/dL (ref 30.0–36.0)
MCV: 93.2 fL (ref 80.0–100.0)
Monocytes Absolute: 0.4 10*3/uL (ref 0.1–1.0)
Monocytes Relative: 7 %
Neutro Abs: 3.5 10*3/uL (ref 1.7–7.7)
Neutrophils Relative %: 68 %
Platelets: 158 10*3/uL (ref 150–400)
RBC: 3.95 MIL/uL (ref 3.87–5.11)
RDW: 12.9 % (ref 11.5–15.5)
WBC: 5.2 10*3/uL (ref 4.0–10.5)
nRBC: 0 % (ref 0.0–0.2)

## 2023-01-09 LAB — COMPREHENSIVE METABOLIC PANEL
ALT: 20 U/L (ref 0–44)
AST: 26 U/L (ref 15–41)
Albumin: 4.2 g/dL (ref 3.5–5.0)
Alkaline Phosphatase: 71 U/L (ref 38–126)
Anion gap: 7 (ref 5–15)
BUN: 26 mg/dL — ABNORMAL HIGH (ref 8–23)
CO2: 29 mmol/L (ref 22–32)
Calcium: 10.4 mg/dL — ABNORMAL HIGH (ref 8.9–10.3)
Chloride: 104 mmol/L (ref 98–111)
Creatinine, Ser: 1.23 mg/dL — ABNORMAL HIGH (ref 0.44–1.00)
GFR, Estimated: 43 mL/min — ABNORMAL LOW (ref >60)
Glucose, Bld: 99 mg/dL (ref 70–99)
Potassium: 4.3 mmol/L (ref 3.5–5.1)
Sodium: 140 mmol/L (ref 135–145)
Total Bilirubin: 0.8 mg/dL (ref 0.3–1.2)
Total Protein: 6.7 g/dL (ref 6.5–8.1)

## 2023-01-09 LAB — TROPONIN I (HIGH SENSITIVITY): Troponin I (High Sensitivity): 7 ng/L (ref <18)

## 2023-01-09 MED ORDER — ACETAMINOPHEN 325 MG PO TABS
650.0000 mg | ORAL_TABLET | Freq: Once | ORAL | Status: AC
  Administered 2023-01-09: 650 mg via ORAL
  Filled 2023-01-09: qty 2

## 2023-01-09 MED ORDER — TETANUS-DIPHTH-ACELL PERTUSSIS 5-2.5-18.5 LF-MCG/0.5 IM SUSY
0.5000 mL | PREFILLED_SYRINGE | Freq: Once | INTRAMUSCULAR | Status: AC
  Administered 2023-01-09: 0.5 mL via INTRAMUSCULAR
  Filled 2023-01-09: qty 0.5

## 2023-01-09 MED ORDER — SODIUM CHLORIDE 0.9 % IV BOLUS
500.0000 mL | Freq: Once | INTRAVENOUS | Status: AC
  Administered 2023-01-09: 500 mL via INTRAVENOUS

## 2023-01-09 NOTE — ED Notes (Signed)
Pt has dementia, unable to triage, will wait for grandson.

## 2023-01-09 NOTE — ED Notes (Signed)
Pt in bed and comfortable, facial abrasions have been cleaned with wound cleaner.  Grandson at bedside, dermabond placed at bedside per [facility] request.

## 2023-01-09 NOTE — Discharge Planning (Signed)
Marquavion Venhuizen J. Clydene Laming, RN, BSN, [place] [phone] RNCM consulted regarding discharge planning for Alison Brewer. Offered pt medicare.gov list of home health agencies to choose from.  Pt chose [facility] to render PT and OTservices. Amy of [facility] notified. Patient made aware that [facility] will be in contact in 24-48 hours.  No DME needs identified at this time.

## 2023-01-09 NOTE — ED Triage Notes (Signed)
Pt had mechanical fall ,hitting her head the patient has multiple lacerations to right cheek, left eyebrow,right forehead as well as bruising.

## 2023-01-09 NOTE — ED Provider Notes (Signed)
[facility] Provider Note   CSN: [id number] Arrival date & time: 01/09/23  1047     History  Chief Complaint  Patient presents with   Alison Brewer is a 88 y.o. female, history of dementia, who presents to the ED secondary to a fall that occurred this morning, she states she does not know what happened, but she fell on her face, did not lose any consciousness.  She denies any chest pain, shortness of breath, pain anywhere but her face.  Denies any chest pain, abdominal pain, pelvic pain, leg pain, arm pain.  No nausea or vomiting.  Is not sure if she is on blood thinners.     Home Medications Prior to Admission medications   Medication Sig Start Date End Date Taking? Authorizing Provider  albuterol (VENTOLIN HFA) 108 (90 Base) MCG/ACT inhaler Inhale 1-2 puffs into the lungs every 6 (six) hours as needed for wheezing or shortness of breath. 08/12/22   Volney American, PA-C  alendronate (FOSAMAX) 70 MG tablet Take 1 tablet (70 mg total) by mouth every 7 (seven) days. Take with a full glass of water on an empty stomach. 12/21/22   [name], NP  aspirin EC 81 MG tablet Take 81 mg by mouth daily. Swallow whole.    [provider]  Calcium Carbonate (CALCIUM 500 PO) Take 1 capsule by mouth daily.    [provider]  ciclopirox (LOPROX) 0.77 % SUSP Apply to affected nail every night, removing with nail polish remover once a week. 08/09/22   [name], MD  hydrocortisone 2.5 % cream Apply to affected areas itching in the groin, inner thighs once to twice daily as needed. 01/02/23   [name], MD  memantine (NAMENDA) 5 MG tablet Take 1 tablet at bedtime for 2 weeks, then 1 tablet twice daily. 06/02/22   Rondel Jumbo, PA-C  Multiple Vitamin (MULTIVITAMIN) tablet Take 1 tablet by mouth daily.    [provider]  sodium chloride (MURO 128) 2 % ophthalmic solution Place 1 drop into both eyes daily.     [provider]      Allergies    Patient has no known allergies.    Review of Systems   Review of Systems  Respiratory:  Negative for shortness of breath.   Cardiovascular:  Negative for chest pain.  Skin:  Positive for wound.    Physical Exam Updated Vital Signs BP (!) 179/86 (BP Location: Right Arm)   Pulse 67   Temp 98.4 F (36.9 C)   Resp 18   SpO2 100%  Physical Exam Vitals and nursing note reviewed.  Constitutional:      General: She is not in acute distress.    Appearance: She is well-developed.  HENT:     Head: Normocephalic and atraumatic.     Comments: Tenderness to palpation of left brow, right cheek without step-offs.  No nasal bone tenderness. +ecchymoses and hematoma to R temple    Left Ear: There is impacted cerumen.     Ears:     Comments: Unable to evaluate for hemotympanums of left ear secondary to cerumen, right ear shows no signs of hemotympanums.  No Battle sign.    Nose:     Comments: No septal hematoma visualized.  No blood    Mouth/Throat:     Comments: No blood or fractured teeth in the oropharynx Eyes:     Conjunctiva/sclera: Conjunctivae normal.  Cardiovascular:     Rate and Rhythm: Normal rate and regular rhythm.     Heart sounds: No murmur heard. Pulmonary:     Effort: Pulmonary effort is normal. No respiratory distress.     Breath sounds: Normal breath sounds.  Chest:     Comments: No ecchymoses, tenderness to palpation of chest wall. Abdominal:     Palpations: Abdomen is soft.     Tenderness: There is no abdominal tenderness.  Musculoskeletal:        General: No swelling.     Cervical back: Neck supple.  Skin:    General: Skin is warm and dry.     Capillary Refill: Capillary refill takes less than 2 seconds.     Comments: 2 cm laceration to left brow, superficial abrasion to right cheek.  Neurological:     Mental Status: She is alert. Mental status is at baseline.  Psychiatric:        Mood and Affect: Mood normal.      ED Results / Procedures / Treatments   Labs (all labs ordered are listed, but only abnormal results are displayed) Labs Reviewed  COMPREHENSIVE METABOLIC PANEL - Abnormal; Notable for the following components:      Result Value   BUN 26 (*)    Creatinine, Ser 1.23 (*)    Calcium 10.4 (*)    GFR, Estimated 43 (*)    All other components within normal limits  CBC WITH DIFFERENTIAL/PLATELET  URINALYSIS, ROUTINE W REFLEX MICROSCOPIC  TROPONIN I (HIGH SENSITIVITY)    EKG None  Radiology CT Maxillofacial Wo Contrast  Result Date: 01/09/2023 CLINICAL DATA:  Fall. Lacerations to the right cheek, left eyebrow, and right forehead. EXAM: CT MAXILLOFACIAL WITHOUT CONTRAST TECHNIQUE: Multidetector CT imaging of the maxillofacial structures was performed. Multiplanar CT image reconstructions were also generated. RADIATION DOSE REDUCTION: This exam was performed according to the departmental dose-optimization program which includes automated exposure control, adjustment of the mA and/or kV according to patient size and/or use of iterative reconstruction technique. COMPARISON:  None Available. FINDINGS: Osseous: No fracture or mandibular dislocation. No destructive process. Bilateral TMJ osteoarthritis. Orbits: Negative. No traumatic or inflammatory finding. Sinuses: Clear. Soft tissues: Mild right facial, left supraorbital, and right forehead soft tissue swelling. Limited intracranial: No significant or unexpected finding. IMPRESSION: 1. No acute facial fracture. Electronically Signed   By: Titus Dubin M.D.   On: 01/09/2023 12:00   CT Cervical Spine Wo Contrast  Result Date: 01/09/2023 CLINICAL DATA:  Polytrauma, blunt EXAM: CT CERVICAL SPINE WITHOUT CONTRAST TECHNIQUE: Multidetector CT imaging of the cervical spine was performed without intravenous contrast. Multiplanar CT image reconstructions were also generated. RADIATION DOSE REDUCTION: This exam was performed according to the  departmental dose-optimization program which includes automated exposure control, adjustment of the mA and/or kV according to patient size and/or use of iterative reconstruction technique. COMPARISON:  None Available. FINDINGS: Alignment: Normal. Skull base and vertebrae: No acute fracture. No primary bone lesion or focal pathologic process. Soft tissues and spinal canal: No prevertebral fluid or swelling. No visible canal hematoma. Disc levels: Disc space narrowing and marginal osteophyte formation identified at each cervical level from C3-4 through C7-T1. Osteoarthritis at C1-C2. Upper chest: Biapical pleural-parenchymal changes consistent with scarring. IMPRESSION: Degenerative changes.  No acute traumatic abnormalities. Electronically Signed   By: Sammie Bench M.D.   On: 01/09/2023 11:53   CT Head Wo Contrast  Result Date: 01/09/2023 CLINICAL DATA:  Head trauma. EXAM: CT HEAD WITHOUT CONTRAST TECHNIQUE: Contiguous axial  images were obtained from the base of the skull through the vertex without intravenous contrast. RADIATION DOSE REDUCTION: This exam was performed according to the departmental dose-optimization program which includes automated exposure control, adjustment of the mA and/or kV according to patient size and/or use of iterative reconstruction technique. COMPARISON:  None Available. FINDINGS: Brain: There is periventricular white matter decreased attenuation consistent with Amberrose Friebel vessel ischemic changes. Ventricles, sulci and cisterns are prominent consistent with age related involutional changes. No acute intracranial hemorrhage, mass effect or shift. No hydrocephalus. Vascular: No hyperdense vessel or unexpected calcification. Skull: Normal. Negative for fracture or focal lesion. Sinuses/Orbits: No acute finding. IMPRESSION: Atrophy and chronic Fayth Trefry vessel ischemic changes. No acute intracranial process identified. Electronically Signed   By: Sammie Bench M.D.   On: 01/09/2023 11:48     Procedures .Marland KitchenLaceration Repair  Date/Time: 01/09/2023 11:35 AM  Performed by: Osvaldo Shipper, PA Authorized by: Osvaldo Shipper, PA   Consent:    Consent obtained:  Verbal   Consent given by:  Patient and guardian   Risks, benefits, and alternatives were discussed: yes     Risks discussed:  Infection, pain, retained foreign body, tendon damage, poor cosmetic result, need for additional repair, nerve damage, poor wound healing and vascular damage   Alternatives discussed:  No treatment Universal protocol:    Patient identity confirmed:  Arm band and verbally with patient Anesthesia:    Anesthesia method:  None Laceration details:    Location:  Face   Face location:  L eyebrow   Length (cm):  2 Pre-procedure details:    Preparation:  Patient was prepped and draped in usual sterile fashion Treatment:    Area cleansed with:  Povidone-iodine   Amount of cleaning:  Standard   Irrigation solution:  Sterile saline   Irrigation method:  Syringe Skin repair:    Repair method:  Tissue adhesive Approximation:    Approximation:  Close Repair type:    Repair type:  Simple Post-procedure details:    Dressing:  Open (no dressing)     Medications Ordered in ED Medications  Tdap (BOOSTRIX) injection 0.5 mL (0.5 mLs Intramuscular Given 01/09/23 1224)  acetaminophen (TYLENOL) tablet 650 mg (650 mg Oral Given 01/09/23 1223)  sodium chloride 0.9 % bolus 500 mL (0 mLs Intravenous Stopped 01/09/23 1516)    ED Course/ Medical Decision Making/ A&P                           Medical Decision Making Patient is an 88 year old female, history of dementia, who presents to the ED secondary to fall, she has a laceration to the left  eyebrow, and hematoma to the right temple.  Will obtain a CT head, cervical spine, and maxillofacial given the trauma.  She has no tenderness to palpation of her chest wall, abdomen, or extremities.  We will also obtain labs secondary to unknown cause of fall.  Amount  and/or Complexity of Data Reviewed Labs: ordered.    Details: Labs unremarkable, troponins within normal limits. Radiology: ordered.    Details: No acute findings on CT scans ECG/medicine tests:  Decision-making details documented in ED Course. Discussion of management or test interpretation with external provider(s): CT scans unremarkable, tetanus updated given laceration to face, from glasses, and unknown last tetanus.  Lab work is fairly unremarkable, grandson declined urinalysis as patient has not been having the symptoms.  Discussed with grandson guardian and sensitively, given concerns for stability for walking,  he states he is at home for 24/7, and he takes care of her, and that he will watch her and make sure she wears a uses a walker, I consulted case management, and they helped make sure that outpatient physical therapy orders were appropriately ordered.  I discussed return precautions with the family member, and emphasized the importance of her using her walker.  We discussed the possibility of transferring her to [facility] for physical therapy evaluation, but they declined.  Return precautions were emphasized, and we discussed wound care.  Risk OTC drugs. Prescription drug management.   Final Clinical Impression(s) / ED Diagnoses Final diagnoses:  Traumatic hematoma of face, initial encounter  Laceration of left eyebrow, initial encounter  Need for Tdap vaccination  Fall, initial encounter  Injury of head, initial encounter    Rx / DC Orders ED Discharge Orders          [place]        01/09/23 1431    Face-to-face encounter (required for Medicare/Medicaid patients)       Comments: I Osvaldo Shipper certify that this patient is under my care and that I, or a nurse practitioner or physician's assistant working with me, had a face-to-face encounter that meets the physician face-to-face encounter requirements with this patient on 01/09/2023. The encounter with the patient  was in whole, or in part for the following medical condition(s) which is the primary reason for home health care (List medical condition): debility, imbalance of gait   01/09/23 1431              Rayme Bui, [place], [place] 01/09/23 [place], [place], MD 01/10/23 [phone]

## 2023-01-09 NOTE — Discharge Instructions (Addendum)
Please follow-up with your primary care doctor, please return to the ER if you have nausea, vomiting, headache, confusion.  Make sure you using your walker at all times, and please have 24/7 supervision to make sure that you are not falling.  If you fall again please return to the ER immediately.  Use ice to help with the swelling.

## 2023-01-10 ENCOUNTER — Telehealth: Payer: Self-pay

## 2023-01-10 DIAGNOSIS — G309 Alzheimer's disease, unspecified: Secondary | ICD-10-CM | POA: Diagnosis not present

## 2023-01-10 DIAGNOSIS — M810 Age-related osteoporosis without current pathological fracture: Secondary | ICD-10-CM | POA: Diagnosis not present

## 2023-01-10 DIAGNOSIS — R2689 Other abnormalities of gait and mobility: Secondary | ICD-10-CM | POA: Diagnosis not present

## 2023-01-10 DIAGNOSIS — I10 Essential (primary) hypertension: Secondary | ICD-10-CM | POA: Diagnosis not present

## 2023-01-10 DIAGNOSIS — S0990XD Unspecified injury of head, subsequent encounter: Secondary | ICD-10-CM | POA: Diagnosis not present

## 2023-01-10 DIAGNOSIS — E785 Hyperlipidemia, unspecified: Secondary | ICD-10-CM | POA: Diagnosis not present

## 2023-01-10 DIAGNOSIS — S0083XD Contusion of other part of head, subsequent encounter: Secondary | ICD-10-CM | POA: Diagnosis not present

## 2023-01-10 DIAGNOSIS — I517 Cardiomegaly: Secondary | ICD-10-CM | POA: Diagnosis not present

## 2023-01-10 DIAGNOSIS — F0390 Unspecified dementia without behavioral disturbance: Secondary | ICD-10-CM | POA: Diagnosis not present

## 2023-01-10 DIAGNOSIS — W19XXXD Unspecified fall, subsequent encounter: Secondary | ICD-10-CM | POA: Diagnosis not present

## 2023-01-10 NOTE — Telephone Encounter (Signed)
Recheck Blood pressure if still low will need to schedule in office appointment to evaluate blood pressure.

## 2023-01-10 NOTE — Telephone Encounter (Signed)
Elzie Rings PT with [facility] health PT left a message on clinical intake voicemail at 1:11pm today stating that she is reporting low pressure on patient. Patient's BP was 90/48. Patient did report some dizziness and lightheadedness earlier. At the time of the message Elzie Rings stated that the patient was doing fine.  She would also like to get verbal orders for home health PT.  Message routed to Marlowe Sax, NP (covering)

## 2023-01-12 ENCOUNTER — Telehealth: Payer: Self-pay

## 2023-01-12 NOTE — Telephone Encounter (Signed)
Alison Brewer with [facility] called requesting verbal orders for PT. 2 times a week for 3 weeks then 1 time a week for 4 weeks. Verbal orders given.

## 2023-01-15 ENCOUNTER — Encounter: Payer: Self-pay | Admitting: Family

## 2023-01-15 ENCOUNTER — Ambulatory Visit (INDEPENDENT_AMBULATORY_CARE_PROVIDER_SITE_OTHER): Payer: PPO | Admitting: Family

## 2023-01-15 VITALS — BP 110/70 | HR 70 | Temp 97.0°F | Resp 16 | Ht 64.0 in | Wt 125.2 lb

## 2023-01-15 DIAGNOSIS — W19XXXD Unspecified fall, subsequent encounter: Secondary | ICD-10-CM

## 2023-01-15 DIAGNOSIS — D649 Anemia, unspecified: Secondary | ICD-10-CM

## 2023-01-15 DIAGNOSIS — N1831 Chronic kidney disease, stage 3a: Secondary | ICD-10-CM | POA: Diagnosis not present

## 2023-01-15 LAB — CBC WITH DIFFERENTIAL/PLATELET
Absolute Monocytes: 464 cells/uL (ref 200–950)
Basophils Absolute: 59 cells/uL (ref 0–200)
Basophils Relative: 1.3 %
Eosinophils Absolute: 99 cells/uL (ref 15–500)
Eosinophils Relative: 2.2 %
HCT: 32.4 % — ABNORMAL LOW (ref 35.0–45.0)
Hemoglobin: 11.1 g/dL — ABNORMAL LOW (ref 11.7–15.5)
Lymphs Abs: 1454 cells/uL (ref 850–3900)
MCH: 31.3 pg (ref 27.0–33.0)
MCHC: 34.3 g/dL (ref 32.0–36.0)
MCV: 91.3 fL (ref 80.0–100.0)
MPV: 10.8 fL (ref 7.5–12.5)
Monocytes Relative: 10.3 %
Neutro Abs: 2426 cells/uL (ref 1500–7800)
Neutrophils Relative %: 53.9 %
Platelets: 183 10*3/uL (ref 140–400)
RBC: 3.55 10*6/uL — ABNORMAL LOW (ref 3.80–5.10)
RDW: 12 % (ref 11.0–15.0)
Total Lymphocyte: 32.3 %
WBC: 4.5 10*3/uL (ref 3.8–10.8)

## 2023-01-15 NOTE — Progress Notes (Signed)
Provider: Kaitlin Alcindor FNP-C   [name], NP  Patient Care Team: [name], NP as PCP - General (Geriatric Medicine) Jettie Booze, MD as PCP - Cardiology (Cardiology)  Extended Emergency Contact Information Primary Emergency Contact: Goertz,joshua Home Phone: [phone] Mobile Phone: [phone] Relation: Grandson Secondary Emergency Contact: Donney Rankins [place] Phone: [phone] Mobile Phone: [phone] Relation: Son  Code Status:  Full Code  Goals of care: Advanced Directive information    01/15/2023   10:51 AM  Advanced Directives  Does Patient Have a Medical Advance Directive? Yes  Type of Advance Directive [place]  Does patient want to make changes to medical advance directive? No - Patient declined  Copy of [place] in Chart? No - copy requested     Chief Complaint  Patient presents with   Hospitalization [facility] follow up for Fall 01/09/2023.    HPI:  Pt is a 88 y.o. female seen today for follow up ED visit for witnessed fall on 01/09/2023.she sustained a small facial laceration which was repaired.Her lab work done were unremarkable including troponin.CT scan of the head,cervical spine and face showed no acute abnormalities.Fluid were giver for Orthostatic.Not on any anticoagulant.Her condition was stable was discharged home with [facility].  She denies any acute issues today.Tdap up to date.  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 If you have any questions, ask your nurse or doctor.          STOP taking these medications    ciclopirox 0.77 % Susp Commonly known as: LOPROX Stopped by: Sandrea Hughs, NP       TAKE these medications    albuterol 108 (90 Base) MCG/ACT inhaler Commonly known as: VENTOLIN HFA Inhale 1-2 puffs into the lungs every 6 (six) hours as needed for wheezing or shortness of breath.   alendronate 70 MG tablet Commonly known as: FOSAMAX Take 1 tablet (70 mg total) by mouth every 7 (seven) days. Take with a full glass of water on an empty stomach.   aspirin EC 81 MG tablet Take 81 mg by mouth daily. Swallow whole.   CALCIUM 500 PO Take 1 capsule by mouth daily.   hydrocortisone 2.5 % cream Apply to affected areas itching in the groin, inner thighs once to twice daily as needed.   memantine 5 MG tablet Commonly known as: NAMENDA Take 1 tablet at bedtime for 2 weeks, then 1 tablet twice daily.   multivitamin tablet Take 1 tablet by mouth daily.   sodium chloride 2 % ophthalmic solution Commonly known as: MURO 128 Place 1 drop into both eyes daily.        Review of Systems  Constitutional:  Negative for appetite change, chills, fatigue, fever and unexpected weight change.  HENT:  Negative for congestion, dental problem, ear discharge, ear pain, facial swelling, hearing  loss, nosebleeds, postnasal drip, rhinorrhea, sinus pressure, sinus pain, sneezing, sore throat, tinnitus and trouble swallowing.   Eyes:  Negative for pain, discharge, redness, itching and visual disturbance.  Respiratory:  Negative for cough, chest tightness, shortness of breath and wheezing.   Cardiovascular:  Negative for chest pain, palpitations and leg swelling.  Gastrointestinal:  Negative for abdominal distention, abdominal pain, blood in stool, constipation, diarrhea, nausea and vomiting.  Endocrine: Negative for cold intolerance, heat intolerance, polydipsia,  polyphagia and polyuria.  Genitourinary:  Negative for difficulty urinating, dysuria, flank pain, frequency and urgency.  Musculoskeletal:  Positive for gait problem. Negative for arthralgias, back pain, joint swelling, myalgias, neck pain and neck stiffness.  Skin:  Negative for color change, pallor, rash and wound.       Right Peri-orbital  bruises.Left eyebrow laceration has healed.   Neurological:  Negative for dizziness, syncope, speech difficulty, weakness, light-headedness, numbness and headaches.  Hematological:  Does not bruise/bleed easily.  Psychiatric/Behavioral:  Negative for agitation, behavioral problems, confusion, hallucinations, self-injury, sleep disturbance and suicidal ideas. The patient is not nervous/anxious.     Immunization History  Administered Date(s) Administered   Fluad Quad(high Dose 65+) 09/01/2022   Influenza Split 09/25/2014   Influenza-Unspecified 09/25/2014, 09/09/2015, 10/04/2017, 06/02/2019   PFIZER Comirnaty(Gray Top)Covid-19 Tri-Sucrose Vaccine 02/17/2020, 03/22/2020   Pneumococcal Conjugate-13 09/09/2015   Pneumococcal Polysaccharide-23 01/01/2017   Tdap 01/09/2023   Zoster Recombinat (Shingrix) 04/06/2020   Zoster, Live 05/26/2010   Pertinent  Health Maintenance Due  Topic Date Due   INFLUENZA VACCINE  Completed   DEXA SCAN  Completed      05/30/2022   11:02 AM 06/02/2022   10:57 AM 08/12/2022   10:26 AM 09/01/2022   10:22 AM 01/15/2023   10:51 AM  Fall Risk  Falls in the past year? 0 0  0 1  Was there an injury with Fall? 0 0  0 1  Fall Risk Category Calculator 0 0  0 2  Fall Risk Category (Retired) Low Low  Low   (RETIRED) Patient Fall Risk Level Low fall risk  Low fall risk Low fall risk   Patient at Risk for Falls Due to No Fall Risks   No Fall Risks History of fall(s);Impaired balance/gait;Impaired mobility  Fall risk Follow up Falls evaluation completed   Falls evaluation completed Falls evaluation completed;Education provided;Falls  prevention discussed   Functional Status Survey:    Vitals:   01/15/23 1050  BP: 110/70  Resp: 16  Temp: (!) 97 F (36.1 C)  Weight: 125 lb 3.2 oz (56.8 kg)  Height: '5\' 4"'$  (1.626 m)   Body mass index is 21.49 kg/m. Physical Exam Vitals reviewed.  Constitutional:      General: She is not in acute distress.    Appearance: Normal appearance. She is normal weight. She is not ill-appearing or diaphoretic.  HENT:     Head: Normocephalic.     Right Ear: Tympanic membrane, ear canal and external ear normal. There is no impacted cerumen.     Left Ear: Tympanic membrane, ear canal and external ear normal. There is no impacted cerumen.     Nose: Nose normal. No congestion or rhinorrhea.     Mouth/Throat:     Mouth: Mucous membranes are moist.     Pharynx: Oropharynx is clear. No oropharyngeal exudate or posterior oropharyngeal erythema.  Eyes:     General: No scleral icterus.       Right eye: No discharge.  Left eye: No discharge.     Extraocular Movements: Extraocular movements intact.     Conjunctiva/sclera: Conjunctivae normal.     Pupils: Pupils are equal, round, and reactive to light.  Neck:     Vascular: No carotid bruit.  Cardiovascular:     Rate and Rhythm: Normal rate and regular rhythm.     Pulses: Normal pulses.     Heart sounds: Normal heart sounds. No murmur heard.    No friction rub. No gallop.  Pulmonary:     Effort: Pulmonary effort is normal. No respiratory distress.     Breath sounds: Normal breath sounds. No wheezing, rhonchi or rales.  Chest:     Chest wall: No tenderness.  Abdominal:     General: Bowel sounds are normal. There is no distension.     Palpations: Abdomen is soft. There is no mass.     Tenderness: There is no abdominal tenderness. There is no right CVA tenderness, left CVA tenderness, guarding or rebound.  Musculoskeletal:        General: No swelling or tenderness. Normal range of motion.     Cervical back: Normal range of motion. No  rigidity or tenderness.     Right lower leg: No edema.     Left lower leg: No edema.  Lymphadenopathy:     Cervical: No cervical adenopathy.  Skin:    General: Skin is warm and dry.     Coloration: Skin is not pale.     Findings: No bruising, erythema, lesion or rash.     Comments: Right peri-orbital purple bruise with diffuse yellowish color on right face and left face.laceration healed. No signs of infection.  Neurological:     Mental Status: She is alert and oriented to person, place, and time.     Cranial Nerves: No cranial nerve deficit.     Sensory: No sensory deficit.     Motor: No weakness.     Coordination: Coordination normal.     Gait: Gait normal.  Psychiatric:        Mood and Affect: Mood normal.        Speech: Speech normal.        Behavior: Behavior normal.        Thought Content: Thought content normal.        Judgment: Judgment normal.     Labs reviewed: Recent Labs    02/13/22 1217 05/29/22 1125 01/09/23 1150  NA 141 137 140  K 4.3 4.9 4.3  CL 105 103 104  CO2 '28 27 29  '$ GLUCOSE 99 90 99  BUN 20 28* 26*  CREATININE 1.03* 1.13* 1.23*  CALCIUM 10.0 9.1 10.4*   Recent Labs    02/13/22 1217 05/29/22 1125 01/09/23 1150  AST '28 23 26  '$ ALT '22 17 20  '$ ALKPHOS 82  --  71  BILITOT 0.8 0.5 0.8  PROT 6.5 6.2 6.7  ALBUMIN 4.0  --  4.2   Recent Labs    02/13/22 1217 05/29/22 1125 01/09/23 1150  WBC 4.6 4.9 5.2  NEUTROABS 2.7 2,783 3.5  HGB 11.8* 11.7 12.2  HCT 35.8* 35.0 36.8  MCV 94.0 91.1 93.2  PLT 184 192 158   No results found for: "TSH" No results found for: "HGBA1C" Lab Results  Component Value Date   CHOL 174 05/29/2022   HDL 82 05/29/2022   LDLCALC 70 05/29/2022   TRIG 135 05/29/2022   CHOLHDL 2.1 05/29/2022    Significant Diagnostic Results in last 30 days:  CT Maxillofacial Wo Contrast  Result Date: 01/09/2023 CLINICAL DATA:  Fall. Lacerations to the right cheek, left eyebrow, and right forehead. EXAM: CT MAXILLOFACIAL WITHOUT  CONTRAST TECHNIQUE: Multidetector CT imaging of the maxillofacial structures was performed. Multiplanar CT image reconstructions were also generated. RADIATION DOSE REDUCTION: This exam was performed according to the departmental dose-optimization program which includes automated exposure control, adjustment of the mA and/or kV according to patient size and/or use of iterative reconstruction technique. COMPARISON:  None Available. FINDINGS: Osseous: No fracture or mandibular dislocation. No destructive process. Bilateral TMJ osteoarthritis. Orbits: Negative. No traumatic or inflammatory finding. Sinuses: Clear. Soft tissues: Mild right facial, left supraorbital, and right forehead soft tissue swelling. Limited intracranial: No significant or unexpected finding. IMPRESSION: 1. No acute facial fracture. Electronically Signed   By: Titus Dubin M.D.   On: 01/09/2023 12:00   CT Cervical Spine Wo Contrast  Result Date: 01/09/2023 CLINICAL DATA:  Polytrauma, blunt EXAM: CT CERVICAL SPINE WITHOUT CONTRAST TECHNIQUE: Multidetector CT imaging of the cervical spine was performed without intravenous contrast. Multiplanar CT image reconstructions were also generated. RADIATION DOSE REDUCTION: This exam was performed according to the departmental dose-optimization program which includes automated exposure control, adjustment of the mA and/or kV according to patient size and/or use of iterative reconstruction technique. COMPARISON:  None Available. FINDINGS: Alignment: Normal. Skull base and vertebrae: No acute fracture. No primary bone lesion or focal pathologic process. Soft tissues and spinal canal: No prevertebral fluid or swelling. No visible canal hematoma. Disc levels: Disc space narrowing and marginal osteophyte formation identified at each cervical level from C3-4 through C7-T1. Osteoarthritis at C1-C2. Upper chest: Biapical pleural-parenchymal changes consistent with scarring. IMPRESSION: Degenerative changes.   No acute traumatic abnormalities. Electronically Signed   By: Sammie Bench M.D.   On: 01/09/2023 11:53   CT Head Wo Contrast  Result Date: 01/09/2023 CLINICAL DATA:  Head trauma. EXAM: CT HEAD WITHOUT CONTRAST TECHNIQUE: Contiguous axial images were obtained from the base of the skull through the vertex without intravenous contrast. RADIATION DOSE REDUCTION: This exam was performed according to the departmental dose-optimization program which includes automated exposure control, adjustment of the mA and/or kV according to patient size and/or use of iterative reconstruction technique. COMPARISON:  None Available. FINDINGS: Brain: There is periventricular white matter decreased attenuation consistent with small vessel ischemic changes. Ventricles, sulci and cisterns are prominent consistent with age related involutional changes. No acute intracranial hemorrhage, mass effect or shift. No hydrocephalus. Vascular: No hyperdense vessel or unexpected calcification. Skull: Normal. Negative for fracture or focal lesion. Sinuses/Orbits: No acute finding. IMPRESSION: Atrophy and chronic small vessel ischemic changes. No acute intracranial process identified. Electronically Signed   By: Sammie Bench M.D.   On: 01/09/2023 11:48   DG Bone Density  Result Date: 12/20/2022 EXAM: DUAL X-RAY ABSORPTIOMETRY (DXA) FOR BONE MINERAL DENSITY IMPRESSION: Referring Physician:  [name] Your patient completed a bone mineral density test using GE Lunar iDXA system (analysis version: 16). Technologist:sec PATIENT: Name: Darnesha, Diloreto Patient ID: [id number] Birth Date: March 13, 1935 Height: 63.5 in. Sex: Female Measured: 12/20/2022 Weight: 124.0 lbs. Indications: Advanced Age, Estrogen Deficient, Hysterectomy, Postmenopausal Fractures: NONE Treatments: Calcium (E943.0), Vitamin D (E933.5) ASSESSMENT: The BMD measured at Femur Total Left is 0.650 g/cm2 with a T-score of -2.8. This patient is considered osteoporotic  according to [facility]) criteria. The quality of the exam is good. Site Region Measured Date Measured Age YA BMD Significant CHANGE T-score DualFemur Total Left 12/20/2022 85.5 -2.8 0.650  g/cm2 DualFemur Total Mean 12/20/2022 85.5 -2.8 0.656 g/cm2 AP Spine L1-L4 12/20/2022 85.5 -1.3 1.022 g/cm2 Left Forearm Radius 33% 12/20/2022 85.5 -2.7 0.636 g/cm2 World Health [facility]) criteria for post-menopausal, Caucasian Women: Normal       T-score at or above -1 SD Osteopenia   T-score between -1 and -2.5 SD Osteoporosis T-score at or below -2.5 SD RECOMMENDATION: 1. All patients should optimize calcium and vitamin D intake. 2. Consider FDA-approved medical therapies in postmenopausal women and men aged 84 years and older, based on the following: a. A hip or vertebral (clinical or morphometric) fracture. b. T-score = -2.5 at the femoral neck or spine after appropriate evaluation to exclude secondary causes. c. Low bone mass (T-score between -1.0 and -2.5 at the femoral neck or spine) and a 10-year probability of a hip fracture = 3% or a 10-year probability of a major osteoporosis-related fracture = 20% based on the [place]-adapted WHO algorithm. d. Clinician judgment and/or patient preferences may indicate treatment for people with 10-year fracture probabilities above or below these levels. FOLLOW-UP: Patients with diagnosis of osteoporosis or at high risk for fracture should have regular bone mineral density tests.? Patients eligible for Medicare are allowed routine testing every 2 years.? The testing frequency can be increased to one year for patients who have rapidly progressing disease, are receiving or discontinuing medical therapy to restore bone mass, or have additional risk factors. I have reviewed this study and agree with the findings. [facility] Electronically Signed   By: Franki Cabot M.D.   On: 12/20/2022 15:51    Assessment/Plan 1. Fall, subsequent encounter S/p  witnessed fall.left forehead laceration has healed.CT scan in ED was negative for acute abnormalities.  - Fall and safety precaution advised   2. Anemia, unspecified type Hgb slight low compared to previous suspect from left forehead laceration post fall.will recheck Hgb. - CBC with Differential/Platelet  3. Stage 3a chronic kidney disease (HCC) CR 1.23 slightly high from baseline  continue to avoid Nephrotoxins and dose all other medication for renal clearance   Family/ staff Communication: Reviewed plan of care with patient verbalized understanding   Labs/tests ordered: None   Next Appointment : Return if symptoms worsen or fail to improve.   Sandrea Hughs, NP

## 2023-01-16 ENCOUNTER — Telehealth: Payer: Self-pay

## 2023-01-16 ENCOUNTER — Ambulatory Visit: Payer: PPO | Admitting: Nurse Practitioner

## 2023-01-16 NOTE — Telephone Encounter (Signed)
Will from OT called to get verbal orders for patient for 1X week for three week. Orders were given.

## 2023-01-17 NOTE — Telephone Encounter (Signed)
Noted, thank you

## 2023-01-22 DIAGNOSIS — M810 Age-related osteoporosis without current pathological fracture: Secondary | ICD-10-CM | POA: Diagnosis not present

## 2023-01-22 DIAGNOSIS — R2689 Other abnormalities of gait and mobility: Secondary | ICD-10-CM | POA: Diagnosis not present

## 2023-01-22 DIAGNOSIS — F0390 Unspecified dementia without behavioral disturbance: Secondary | ICD-10-CM | POA: Diagnosis not present

## 2023-01-22 DIAGNOSIS — S0990XD Unspecified injury of head, subsequent encounter: Secondary | ICD-10-CM | POA: Diagnosis not present

## 2023-01-22 DIAGNOSIS — E785 Hyperlipidemia, unspecified: Secondary | ICD-10-CM | POA: Diagnosis not present

## 2023-01-22 DIAGNOSIS — S0083XD Contusion of other part of head, subsequent encounter: Secondary | ICD-10-CM | POA: Diagnosis not present

## 2023-01-22 DIAGNOSIS — I10 Essential (primary) hypertension: Secondary | ICD-10-CM | POA: Diagnosis not present

## 2023-01-22 DIAGNOSIS — G309 Alzheimer's disease, unspecified: Secondary | ICD-10-CM | POA: Diagnosis not present

## 2023-01-22 DIAGNOSIS — I517 Cardiomegaly: Secondary | ICD-10-CM | POA: Diagnosis not present

## 2023-01-22 DIAGNOSIS — W19XXXD Unspecified fall, subsequent encounter: Secondary | ICD-10-CM | POA: Diagnosis not present

## 2023-02-07 DIAGNOSIS — H04123 Dry eye syndrome of bilateral lacrimal glands: Secondary | ICD-10-CM | POA: Diagnosis not present

## 2023-02-07 DIAGNOSIS — H25813 Combined forms of age-related cataract, bilateral: Secondary | ICD-10-CM | POA: Diagnosis not present

## 2023-02-07 DIAGNOSIS — H53022 Refractive amblyopia, left eye: Secondary | ICD-10-CM | POA: Diagnosis not present

## 2023-02-15 ENCOUNTER — Encounter: Payer: Self-pay | Admitting: Radiology

## 2023-02-21 DIAGNOSIS — F0390 Unspecified dementia without behavioral disturbance: Secondary | ICD-10-CM | POA: Diagnosis not present

## 2023-02-21 DIAGNOSIS — W19XXXD Unspecified fall, subsequent encounter: Secondary | ICD-10-CM | POA: Diagnosis not present

## 2023-02-21 DIAGNOSIS — S0083XD Contusion of other part of head, subsequent encounter: Secondary | ICD-10-CM | POA: Diagnosis not present

## 2023-02-21 DIAGNOSIS — R2689 Other abnormalities of gait and mobility: Secondary | ICD-10-CM | POA: Diagnosis not present

## 2023-02-21 DIAGNOSIS — S0990XD Unspecified injury of head, subsequent encounter: Secondary | ICD-10-CM | POA: Diagnosis not present

## 2023-02-21 DIAGNOSIS — M810 Age-related osteoporosis without current pathological fracture: Secondary | ICD-10-CM | POA: Diagnosis not present

## 2023-02-21 DIAGNOSIS — E785 Hyperlipidemia, unspecified: Secondary | ICD-10-CM | POA: Diagnosis not present

## 2023-02-21 DIAGNOSIS — G309 Alzheimer's disease, unspecified: Secondary | ICD-10-CM | POA: Diagnosis not present

## 2023-02-21 DIAGNOSIS — I10 Essential (primary) hypertension: Secondary | ICD-10-CM | POA: Diagnosis not present

## 2023-02-21 DIAGNOSIS — I517 Cardiomegaly: Secondary | ICD-10-CM | POA: Diagnosis not present

## 2023-03-01 ENCOUNTER — Encounter: Payer: Self-pay | Admitting: Nurse Practitioner

## 2023-03-02 ENCOUNTER — Encounter: Payer: PPO | Admitting: Nurse Practitioner

## 2023-03-02 NOTE — Progress Notes (Signed)
Err

## 2023-03-19 ENCOUNTER — Encounter: Payer: Self-pay | Admitting: Nurse Practitioner

## 2023-03-19 ENCOUNTER — Ambulatory Visit (INDEPENDENT_AMBULATORY_CARE_PROVIDER_SITE_OTHER): Payer: PPO | Admitting: Nurse Practitioner

## 2023-03-19 VITALS — BP 122/68 | HR 72 | Temp 96.9°F | Ht 64.0 in | Wt 126.0 lb

## 2023-03-19 DIAGNOSIS — M810 Age-related osteoporosis without current pathological fracture: Secondary | ICD-10-CM

## 2023-03-19 DIAGNOSIS — D649 Anemia, unspecified: Secondary | ICD-10-CM | POA: Diagnosis not present

## 2023-03-19 DIAGNOSIS — N1831 Chronic kidney disease, stage 3a: Secondary | ICD-10-CM

## 2023-03-19 DIAGNOSIS — F01B0 Vascular dementia, moderate, without behavioral disturbance, psychotic disturbance, mood disturbance, and anxiety: Secondary | ICD-10-CM

## 2023-03-19 DIAGNOSIS — R2689 Other abnormalities of gait and mobility: Secondary | ICD-10-CM | POA: Diagnosis not present

## 2023-03-19 NOTE — Progress Notes (Signed)
Careteam: Patient Care Team: [name], NP as PCP - General (Geriatric Medicine) Jettie Booze, MD as PCP - Cardiology (Cardiology)  PLACE OF SERVICE:  [place] Directive information Does Patient Have a Medical Advance Directive?: Yes, Type of Advance Directive: [place], Does patient want to make changes to medical advance directive?: No - Patient declined  No Known Allergies  Chief Complaint  Patient presents with   Medical Management of Chronic Issues    6 month follow-up. Discuss need for covid boosters and shingrix. Patient denies receiving any vaccines since last visit. Examine rash on chest/abdomen area. Here with Alison Brewer, grandson,      HPI: Patient is a 88 y.o. female for follow up.   She had a fall in January- no falls since. Did not have a fracture.   Eating well- no weight loss.   Osteoporosis- on cal and vit d with fosamax  Dementia- taking namenda twice daily, overall memory has been stable.   Had rash where her adult diaper is, grandson wants to make sure this has resolved.   Getting up twice at night to go to the bathroom.   Review of Systems:  Review of Systems  Constitutional:  Negative for chills, fever and weight loss.  HENT:  Negative for tinnitus.   Respiratory:  Negative for cough, sputum production and shortness of breath.   Cardiovascular:  Negative for chest pain, palpitations and leg swelling.  Gastrointestinal:  Negative for abdominal pain, constipation, diarrhea and heartburn.  Genitourinary:  Negative for dysuria, frequency and urgency.  Musculoskeletal:  Negative for back pain, falls, joint pain and myalgias.  Skin: Negative.   Neurological:  Negative for dizziness and headaches.  Psychiatric/Behavioral:  Negative for depression and memory loss. The patient does not have insomnia.     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 Her smoking use included cigarettes. She has a 27.00 pack-year smoking history. She has never used smokeless tobacco. She reports that she does not drink alcohol and does not use drugs.  Family History  Problem Relation Age of Onset   Pancreatic cancer Mother    Coronary artery disease Father    Cancer Brother    Breast cancer Maternal Aunt     Medications: Patient's Medications  New Prescriptions   No medications on file  Previous Medications   ALBUTEROL (VENTOLIN HFA) 108 (90 BASE) MCG/ACT INHALER    Inhale 1-2 puffs into the lungs every 6 (six) hours as needed for wheezing or shortness of breath.   ALENDRONATE (FOSAMAX) 70 MG TABLET    Take 1 tablet (70 mg total) by mouth every 7 (seven) days. Take with a full glass of water on an empty stomach.   ASPIRIN EC 81 MG TABLET    Take 81 mg by mouth daily. Swallow whole.   CALCIUM CARBONATE (CALCIUM 500 PO)    Take 1 capsule by mouth 2 (two) times daily.   HYDROCORTISONE 2.5 % CREAM    Apply to affected areas itching  in the groin, inner thighs once to twice daily as needed.   MEMANTINE (NAMENDA) 5 MG TABLET    Take 1 tablet at bedtime for 2 weeks, then 1 tablet twice daily.   MULTIPLE VITAMIN (MULTIVITAMIN) TABLET    Take 1 tablet by mouth daily.   SODIUM CHLORIDE (MURO 128) 2 % OPHTHALMIC SOLUTION    Place 1 drop into both eyes daily.  Modified Medications   No medications on file  Discontinued Medications   No medications on file    Physical Exam:  Vitals:   03/19/23 1509  BP: 122/68  Pulse: 72  Temp: (!) 96.9 F (36.1 C)  TempSrc: Temporal  SpO2: 97%  Weight: 126 lb (57.2 kg)  Height: 5\' 4"  (1.626 m)    Body mass index is 21.63 kg/m. Wt Readings from Last 3 Encounters:  03/19/23 126 lb (57.2 kg)  01/15/23 125 lb 3.2 oz (56.8 kg)  12/22/22 123 lb (55.8 kg)    Physical Exam Constitutional:      General: She is not in acute distress.    Appearance: She is well-developed. She is not diaphoretic.  HENT:     Head: Normocephalic and atraumatic.     Mouth/Throat:     Pharynx: No oropharyngeal exudate.  Eyes:     Conjunctiva/sclera: Conjunctivae normal.     Pupils: Pupils are equal, round, and reactive to light.  Cardiovascular:     Rate and Rhythm: Normal rate and regular rhythm.     Heart sounds: Normal heart sounds.  Pulmonary:     Effort: Pulmonary effort is normal.     Breath sounds: Normal breath sounds.  Abdominal:     General: Bowel sounds are normal.     Palpations: Abdomen is soft.  Musculoskeletal:     Cervical back: Normal range of motion and neck supple.     Right lower leg: No edema.     Left lower leg: No edema.  Skin:    General: Skin is warm and dry.  Neurological:     Mental Status: She is alert. Mental status is at baseline.     Motor: Weakness present.     Gait: Gait abnormal.  Psychiatric:        Mood and Affect: Mood normal.     Labs reviewed: Basic Metabolic Panel: Recent Labs    05/29/22 1125 01/09/23 1150  NA 137 140  K 4.9 4.3  CL 103 104  CO2 27 29  GLUCOSE 90 99  BUN 28* 26*  CREATININE 1.13* 1.23*  CALCIUM 9.1 10.4*   Liver Function Tests: Recent Labs    05/29/22 1125 01/09/23 1150  AST 23 26  ALT 17 20  ALKPHOS  --  71  BILITOT 0.5 0.8  PROT 6.2 6.7  ALBUMIN  --  4.2   No results for input(s): "LIPASE", "AMYLASE" in the last 8760 hours. No results for input(s): "AMMONIA" in the last 8760 hours. CBC: Recent Labs    05/29/22 1125 01/09/23 1150 01/15/23 1125  WBC 4.9 5.2 4.5  NEUTROABS 2,783 3.5 2,426  HGB 11.7 12.2 11.1*  HCT 35.0 36.8 32.4*  MCV 91.1 93.2 91.3  PLT 192 158 183   Lipid Panel: Recent Labs     05/29/22 1125  CHOL 174  HDL 82  LDLCALC 70  TRIG 135  CHOLHDL 2.1   TSH: No results for input(s): "TSH" in the last 8760 hours. A1C: No results found for: "HGBA1C"   Assessment/Plan 1. Anemia, unspecified type -no signs  of blood loss, will follow up cbc.  - CBC with Differential/Platelet  2. Stage 3a chronic kidney disease -Chronic and stable Encourage proper hydration Follow metabolic panel Avoid nephrotoxic meds (NSAIDS) - Complete Metabolic Panel with eGFR  3. Age-related osteoporosis without current pathological fracture -continues on fosamax with cal and vit d.  4. Moderate vascular dementia without behavioral disturbance, psychotic disturbance, mood disturbance, or anxiety -Stable, no acute changes in cognitive or functional status, continue supportive care.   5. Imbalance -continues on walker, encouraged to keep up with strength training exercises.   Return in about 6 months (around 09/18/2023) for routine follow up.:   Rmani Kellogg K. [place], [facility] [phone]

## 2023-03-20 LAB — COMPLETE METABOLIC PANEL WITH GFR
AG Ratio: 2 (calc) (ref 1.0–2.5)
ALT: 17 U/L (ref 6–29)
AST: 24 U/L (ref 10–35)
Albumin: 4.1 g/dL (ref 3.6–5.1)
Alkaline phosphatase (APISO): 96 U/L (ref 37–153)
BUN/Creatinine Ratio: 27 (calc) — ABNORMAL HIGH (ref 6–22)
BUN: 37 mg/dL — ABNORMAL HIGH (ref 7–25)
CO2: 31 mmol/L (ref 20–32)
Calcium: 9.9 mg/dL (ref 8.6–10.4)
Chloride: 103 mmol/L (ref 98–110)
Creat: 1.38 mg/dL — ABNORMAL HIGH (ref 0.60–0.95)
Globulin: 2.1 g/dL (calc) (ref 1.9–3.7)
Glucose, Bld: 86 mg/dL (ref 65–99)
Potassium: 4.7 mmol/L (ref 3.5–5.3)
Sodium: 140 mmol/L (ref 135–146)
Total Bilirubin: 0.5 mg/dL (ref 0.2–1.2)
Total Protein: 6.2 g/dL (ref 6.1–8.1)
eGFR: 38 mL/min/{1.73_m2} — ABNORMAL LOW (ref >=60)

## 2023-03-20 LAB — CBC WITH DIFFERENTIAL/PLATELET
Absolute Monocytes: 570 cells/uL (ref 200–950)
Basophils Absolute: 42 cells/uL (ref 0–200)
Basophils Relative: 0.7 %
Eosinophils Absolute: 168 cells/uL (ref 15–500)
Eosinophils Relative: 2.8 %
HCT: 32.5 % — ABNORMAL LOW (ref 35.0–45.0)
Hemoglobin: 10.9 g/dL — ABNORMAL LOW (ref 11.7–15.5)
Lymphs Abs: 1602 cells/uL (ref 850–3900)
MCH: 31.3 pg (ref 27.0–33.0)
MCHC: 33.5 g/dL (ref 32.0–36.0)
MCV: 93.4 fL (ref 80.0–100.0)
MPV: 10.5 fL (ref 7.5–12.5)
Monocytes Relative: 9.5 %
Neutro Abs: 3618 cells/uL (ref 1500–7800)
Neutrophils Relative %: 60.3 %
Platelets: 180 10*3/uL (ref 140–400)
RBC: 3.48 10*6/uL — ABNORMAL LOW (ref 3.80–5.10)
RDW: 12.3 % (ref 11.0–15.0)
Total Lymphocyte: 26.7 %
WBC: 6 10*3/uL (ref 3.8–10.8)

## 2023-04-09 ENCOUNTER — Encounter: Payer: Self-pay | Admitting: Nurse Practitioner

## 2023-04-09 ENCOUNTER — Ambulatory Visit (INDEPENDENT_AMBULATORY_CARE_PROVIDER_SITE_OTHER): Payer: PPO | Admitting: Nurse Practitioner

## 2023-04-09 VITALS — BP 120/80 | HR 62 | Temp 96.8°F | Resp 18 | Ht 64.0 in | Wt 125.4 lb

## 2023-04-09 DIAGNOSIS — R410 Disorientation, unspecified: Secondary | ICD-10-CM

## 2023-04-09 LAB — POCT URINALYSIS DIPSTICK
Bilirubin, UA: NEGATIVE
Blood, UA: NEGATIVE
Glucose, UA: NEGATIVE
Ketones, UA: NEGATIVE
Nitrite, UA: NEGATIVE
Protein, UA: NEGATIVE
Spec Grav, UA: 1.01 (ref 1.010–1.025)
Urobilinogen, UA: 0.2 E.U./dL
pH, UA: 5 (ref 5.0–8.0)

## 2023-04-09 NOTE — Addendum Note (Signed)
Addended by: Maurice Small on: 04/09/2023 02:28 PM   Modules accepted: Orders

## 2023-04-09 NOTE — Progress Notes (Signed)
Careteam: Patient Care Team: Sharon Seller, NP as PCP - General (Geriatric Medicine) Corky Crafts, MD as PCP - Cardiology (Cardiology)  PLACE OF SERVICE:  [facility]  Advanced Directive information    No Known Allergies  Chief Complaint  Patient presents with   Acute Visit    Behavior changes, questioning if related to a possible urinary tract infection.      HPI: Patient is a 88 y.o. female due to abnormal behavior.  Here with grandson who she lives with.  She was taking off her briefs and holding the used depends (Full of urine).  Very confused and disoriented. Thought to be due to progression of dementia but then got to thinking about the last time she had a urinary tract infection.  She has been slowing progressing with her dementia but in the last 2 weeks there was a significant decline and wanted to make sure nothing else was going on.   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 Her smoking use included cigarettes. She has a 27.00 pack-year smoking history. She has never used smokeless tobacco. She reports that she does not drink alcohol and does not use drugs.  Family History  Problem Relation Age of Onset   Pancreatic cancer Mother    Coronary artery disease Father    Cancer Brother    Breast cancer Maternal Aunt     Medications: Patient's Medications  New Prescriptions   No medications on file  Previous Medications   ALBUTEROL (VENTOLIN HFA) 108 (90 BASE) MCG/ACT INHALER    Inhale 1-2 puffs  into the lungs every 6 (six) hours as needed for wheezing or shortness of breath.   ALENDRONATE (FOSAMAX) 70 MG TABLET    Take 1 tablet (70 mg total) by mouth every 7 (seven) days. Take with a full glass of water on an empty stomach.   ASPIRIN EC 81 MG TABLET    Take 81 mg by mouth daily. Swallow whole.   CALCIUM CARBONATE (CALCIUM 500 PO)    Take 1 capsule by mouth 2 (two) times daily.   MEMANTINE (NAMENDA) 5 MG TABLET    Take 1 tablet at bedtime for 2 weeks, then 1 tablet twice daily.   MULTIPLE VITAMIN (MULTIVITAMIN) TABLET    Take 1 tablet by mouth daily.   SODIUM CHLORIDE (MURO 128) 2 % OPHTHALMIC SOLUTION    Place 1 drop into both eyes daily.  Modified Medications   No medications on file  Discontinued Medications   HYDROCORTISONE 2.5 % CREAM    Apply to affected areas itching in the groin, inner thighs once to twice daily as needed.    Physical Exam:  Vitals:   04/09/23 1322  BP: 120/80  Pulse: 62  Resp: 18  Temp: (!) 96.8 F (36 C)  SpO2: 99%  Weight: 125 lb 6 oz (56.9 kg)  Height:  (1.626 m)   Body mass index is 21.52 kg/m.  Wt Readings from Last 3 Encounters:  04/09/23 125 lb 6 oz (56.9 kg)  03/19/23 126 lb (57.2 kg)  01/15/23 125 lb 3.2 oz (56.8 kg)    Physical Exam Constitutional:      General: She is not in acute distress.    Appearance: She is well-developed. She is not diaphoretic.  HENT:     Head: Normocephalic and atraumatic.     Mouth/Throat:     Pharynx: No oropharyngeal exudate.  Eyes:     Conjunctiva/sclera: Conjunctivae normal.     Pupils: Pupils are equal, round, and reactive to light.  Cardiovascular:     Rate and Rhythm: Normal rate and regular rhythm.     Heart sounds: Normal heart sounds.  Pulmonary:     Effort: Pulmonary effort is normal.     Breath sounds: Normal breath sounds.  Abdominal:     General: Bowel sounds are normal.     Palpations: Abdomen is soft.  Musculoskeletal:     Cervical back: Normal range of motion and neck  supple.     Right lower leg: No edema.     Left lower leg: No edema.  Skin:    General: Skin is warm and dry.  Neurological:     Mental Status: She is alert.  Psychiatric:        Mood and Affect: Mood normal.     Labs reviewed: Basic Metabolic Panel: Recent Labs    05/29/22 1125 01/09/23 1150 03/19/23 1559  NA 137 140 140  K 4.9 4.3 4.7  CL 103 104 103  CO2 GLUCOSE 90 99 86  BUN 28* 26* 37*  CREATININE 1.13* 1.23* 1.38*  CALCIUM 9.1 10.4* 9.9   Liver Function Tests: Recent Labs    05/29/22 1125 01/09/23 1150 03/19/23 1559  AST ALT ALKPHOS  --  71  --   BILITOT 0.5 0.8 0.5  PROT 6.2 6.7 6.2  ALBUMIN  --  4.2  --    No results for input(s): "LIPASE", "AMYLASE" in the last 8760 hours. No results for input(s): "AMMONIA" in the last 8760 hours. CBC: Recent Labs    01/09/23 1150 01/15/23 1125 03/19/23 1559  WBC 5.2 4.5 6.0  NEUTROABS 3.5 2,426 3,618  HGB 12.2 11.1* 10.9*  HCT 36.8 32.4* 32.5*  MCV 93.2 91.3 93.4  PLT 158 183 180   Lipid Panel: Recent Labs    05/29/22 1125  CHOL 174  HDL 82  LDLCALC 70  TRIG 135  CHOLHDL 2.1   TSH: No results for input(s): "TSH" in the last 8760 hours. A1C: No results found for: "HGBA1C"   Assessment/Plan 1. Delirium Worsening functional status, last time she had an acute change it was due to infection. Will get UA C&S to evaluate at this time as well as blood work.  - CBC with Differential/Platelet - Complete Metabolic Panel with eGFR - TSH   Reedy Biernat K. Biagio [facility] [phone]

## 2023-04-10 LAB — COMPLETE METABOLIC PANEL WITH GFR
AG Ratio: 2 (calc) (ref 1.0–2.5)
ALT: 20 U/L (ref 6–29)
AST: 24 U/L (ref 10–35)
Albumin: 4.2 g/dL (ref 3.6–5.1)
Alkaline phosphatase (APISO): 88 U/L (ref 37–153)
BUN/Creatinine Ratio: 24 (calc) — ABNORMAL HIGH (ref 6–22)
BUN: 31 mg/dL — ABNORMAL HIGH (ref 7–25)
CO2: 30 mmol/L (ref 20–32)
Calcium: 9.8 mg/dL (ref 8.6–10.4)
Chloride: 104 mmol/L (ref 98–110)
Creat: 1.29 mg/dL — ABNORMAL HIGH (ref 0.60–0.95)
Globulin: 2.1 g/dL (calc) (ref 1.9–3.7)
Glucose, Bld: 65 mg/dL (ref 65–99)
Potassium: 4.2 mmol/L (ref 3.5–5.3)
Sodium: 140 mmol/L (ref 135–146)
Total Bilirubin: 0.5 mg/dL (ref 0.2–1.2)
Total Protein: 6.3 g/dL (ref 6.1–8.1)
eGFR: 41 mL/min/{1.73_m2} — ABNORMAL LOW (ref >=60)

## 2023-04-10 LAB — CBC WITH DIFFERENTIAL/PLATELET
Absolute Monocytes: 633 cells/uL (ref 200–950)
Basophils Absolute: 28 cells/uL (ref 0–200)
Basophils Relative: 0.5 %
Eosinophils Absolute: 83 cells/uL (ref 15–500)
Eosinophils Relative: 1.5 %
HCT: 34.1 % — ABNORMAL LOW (ref 35.0–45.0)
Hemoglobin: 11.3 g/dL — ABNORMAL LOW (ref 11.7–15.5)
Lymphs Abs: 1815 cells/uL (ref 850–3900)
MCH: 31.3 pg (ref 27.0–33.0)
MCHC: 33.1 g/dL (ref 32.0–36.0)
MCV: 94.5 fL (ref 80.0–100.0)
MPV: 10.9 fL (ref 7.5–12.5)
Monocytes Relative: 11.5 %
Neutro Abs: 2943 cells/uL (ref 1500–7800)
Neutrophils Relative %: 53.5 %
Platelets: 183 10*3/uL (ref 140–400)
RBC: 3.61 10*6/uL — ABNORMAL LOW (ref 3.80–5.10)
RDW: 12.4 % (ref 11.0–15.0)
Total Lymphocyte: 33 %
WBC: 5.5 10*3/uL (ref 3.8–10.8)

## 2023-04-10 LAB — TSH: TSH: 2.97 mIU/L (ref 0.40–4.50)

## 2023-04-11 ENCOUNTER — Other Ambulatory Visit: Payer: Self-pay | Admitting: Nurse Practitioner

## 2023-04-11 LAB — URINE CULTURE
MICRO NUMBER:: 14857066
SPECIMEN QUALITY:: ADEQUATE

## 2023-04-11 MED ORDER — AMOXICILLIN-POT CLAVULANATE 875-125 MG PO TABS: 1.0000 | ORAL_TABLET | Freq: Two times a day (BID) | ORAL | 0 refills | Status: DC

## 2023-05-18 ENCOUNTER — Other Ambulatory Visit: Payer: Self-pay | Admitting: Physician Assistant

## 2023-05-18 NOTE — Telephone Encounter (Signed)
Has follow up in July with Jerelyn Charles

## 2023-06-01 ENCOUNTER — Ambulatory Visit (INDEPENDENT_AMBULATORY_CARE_PROVIDER_SITE_OTHER): Payer: PPO | Admitting: Nurse Practitioner

## 2023-06-01 ENCOUNTER — Encounter: Payer: Self-pay | Admitting: Nurse Practitioner

## 2023-06-01 VITALS — BP 134/88 | HR 68 | Temp 97.5°F | Resp 16 | Ht 64.0 in | Wt 125.0 lb

## 2023-06-01 DIAGNOSIS — R410 Disorientation, unspecified: Secondary | ICD-10-CM

## 2023-06-01 DIAGNOSIS — M549 Dorsalgia, unspecified: Secondary | ICD-10-CM

## 2023-06-01 DIAGNOSIS — R8290 Unspecified abnormal findings in urine: Secondary | ICD-10-CM | POA: Diagnosis not present

## 2023-06-01 DIAGNOSIS — F01B0 Vascular dementia, moderate, without behavioral disturbance, psychotic disturbance, mood disturbance, and anxiety: Secondary | ICD-10-CM

## 2023-06-01 LAB — POCT URINALYSIS DIPSTICK
Bilirubin, UA: NEGATIVE
Glucose, UA: NEGATIVE
Ketones, UA: POSITIVE
Nitrite, UA: POSITIVE
Protein, UA: NEGATIVE
Spec Grav, UA: 1.01 (ref 1.010–1.025)
Urobilinogen, UA: NEGATIVE E.U./dL — AB
pH, UA: 6.5 (ref 5.0–8.0)

## 2023-06-01 NOTE — Patient Instructions (Signed)
Schedule virtual AWV

## 2023-06-01 NOTE — Progress Notes (Signed)
Careteam: Patient Care Team: Sharon Seller, NP as PCP - General (Geriatric Medicine) Corky Crafts, MD as PCP - Cardiology (Cardiology)  PLACE OF SERVICE:  [facility]  Advanced Directive information Does Patient Have a Medical Advance Directive?: Yes, Type of Advance Directive: Healthcare Power of Attorney, Does patient want to make changes to medical advance directive?: No - Patient declined  No Known Allergies  Chief Complaint  Patient presents with   Acute Visit    Patient complains of possible UTI.     HPI: Patient is a 88 y.o. female due to possible UTI.  Questions if her UTI ever went away.  She is having urinary incontinence at night which is not normal.  She normally will get up and go to the bathroom in the middle of the night. Unsure if she wakes up and just decides not to get up.  Cutting back on some fluids towards the end of the night so she gets better sleep.    Memory loss continues to progress. She is slower.  Having a harder time with thought processes.  She is unable to do the laundry because she was putting her depends in the washer.  Will fold laundry but walks around the house and unsure where it goes.  Now sedentary.   Trying to set up solutions for her to maintain independence.   She had a fall a few weeks ago. Hit her back hit the open door frame. She slid down and had an abrasion on her elbow. Lucila Maine looked her over and she seemed okay and was able to move around.     Review of Systems:  Review of Systems  Constitutional:  Negative for chills, fever and weight loss.  HENT:  Negative for tinnitus.   Respiratory:  Negative for cough, sputum production and shortness of breath.   Cardiovascular:  Negative for chest pain, palpitations and leg swelling.  Gastrointestinal:  Negative for abdominal pain, constipation, diarrhea and heartburn.  Genitourinary:  Positive for dysuria (occasionally). Negative for frequency and urgency.   Musculoskeletal:  Positive for falls. Negative for back pain, joint pain and myalgias.  Skin: Negative.   Neurological:  Positive for weakness. Negative for dizziness and headaches.  Psychiatric/Behavioral:  Positive for memory loss. Negative for depression. The patient does not have insomnia.    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 Her smoking use included cigarettes. She has a 27.00 pack-year smoking history. She has never used smokeless tobacco. She reports that she does not drink alcohol and does not use drugs.  Family History  Problem Relation Age of Onset   Pancreatic cancer Mother    Coronary artery disease Father    Cancer Brother    Breast cancer Maternal Aunt     Medications: Patient's Medications  New Prescriptions   No medications on file  Previous Medications   ALBUTEROL (VENTOLIN HFA) 108 (90 BASE) MCG/ACT INHALER    Inhale 1-2 puffs into the lungs every 6 (six) hours as needed for wheezing or shortness of  breath.   ALENDRONATE (FOSAMAX) 70 MG TABLET    Take 1 tablet (70 mg total) by mouth every 7 (seven) days. Take with a full glass of water on an empty stomach.   ASPIRIN EC 81 MG TABLET    Take 81 mg by mouth daily. Swallow whole.   CALCIUM CARBONATE (CALCIUM 500 PO)    Take 1 capsule by mouth 2 (two) times daily.   MEMANTINE (NAMENDA) 5 MG TABLET    Take 5 mg by mouth in the morning and at bedtime.   MULTIPLE VITAMIN (MULTIVITAMIN) TABLET    Take 1 tablet by mouth daily.   SODIUM CHLORIDE (MURO 128) 2 % OPHTHALMIC SOLUTION    Place 1 drop into both eyes daily.  Modified Medications    No medications on file  Discontinued Medications   AMOXICILLIN-CLAVULANATE (AUGMENTIN) 875-125 MG TABLET    Take 1 tablet by mouth 2 (two) times daily.   MEMANTINE (NAMENDA) 5 MG TABLET    TAKE ONE TABLET BY MOUTH AT BEDTIME FOR TWO WEEKS, THEN ONE TABLET TWO TIMES DAILY    Physical Exam:  Vitals:   06/01/23 1512  BP: 134/88  Pulse: (!) 107  Resp: 16  Temp: (!) 97.5 F (36.4 C)  SpO2: 97%  Weight: 125 lb (56.7 kg)  Height: 5\' 4"  (1.626 m)   Body mass index is 21.46 kg/m. Wt Readings from Last 3 Encounters:  06/01/23 125 lb (56.7 kg)  04/09/23 125 lb 6 oz (56.9 kg)  03/19/23 126 lb (57.2 kg)    Physical Exam Constitutional:      General: She is not in acute distress.    Appearance: She is well-developed. She is not diaphoretic.  HENT:     Head: Normocephalic and atraumatic.     Mouth/Throat:     Pharynx: No oropharyngeal exudate.  Eyes:     Conjunctiva/sclera: Conjunctivae normal.     Pupils: Pupils are equal, round, and reactive to light.  Cardiovascular:     Rate and Rhythm: Normal rate and regular rhythm.     Heart sounds: Normal heart sounds.  Pulmonary:     Effort: Pulmonary effort is normal.     Breath sounds: Normal breath sounds.  Abdominal:     General: Bowel sounds are normal.     Palpations: Abdomen is soft.  Musculoskeletal:     Cervical back: Normal range of motion and neck supple.     Right lower leg: No edema.     Left lower leg: No edema.  Skin:    General: Skin is warm and dry.  Neurological:     Mental Status: She is alert. Mental status is at baseline.     Motor: Weakness present.     Gait: Abnormal gait: with walker.  Psychiatric:        Mood and Affect: Mood normal.     Labs reviewed: Basic Metabolic Panel: Recent Labs    01/09/23 1150 03/19/23 1559 04/09/23 1426  NA 140 140 140  K 4.3 4.7 4.2  CL 104 103 104  CO2 29 31 30   GLUCOSE 99 86 65  BUN 26* 37* 31*  CREATININE 1.23* 1.38* 1.29*  CALCIUM 10.4* 9.9 9.8  TSH  --    --  2.97   Liver Function Tests: Recent Labs    01/09/23 1150 03/19/23 1559 04/09/23 1426  AST 26 24 24   ALT 20 17 20   ALKPHOS 71  --   --   BILITOT 0.8 0.5 0.5  PROT  6.7 6.2 6.3  ALBUMIN 4.2  --   --    No results for input(s): "LIPASE", "AMYLASE" in the last 8760 hours. No results for input(s): "AMMONIA" in the last 8760 hours. CBC: Recent Labs    01/15/23 1125 03/19/23 1559 04/09/23 1426  WBC 4.5 6.0 5.5  NEUTROABS 2,426 3,618 2,943  HGB 11.1* 10.9* 11.3*  HCT 32.4* 32.5* 34.1*  MCV 91.3 93.4 94.5  PLT 183 180 183   Lipid Panel: No results for input(s): "CHOL", "HDL", "LDLCALC", "TRIG", "CHOLHDL", "LDLDIRECT" in the last 8760 hours. TSH: Recent Labs    04/09/23 1426  TSH 2.97   A1C: No results found for: "HGBA1C"   Assessment/Plan 1. Confusion -feels like she is more confused vs her usual dementia. Questions if UTI resolved.  - POC Urinalysis Dipstick - Culture, Urine - CBC with Differential/Platelet - BASIC METABOLIC PANEL WITH GFR  2. Back pain, unspecified back location, unspecified back pain laterality, unspecified chronicity -improving from fall.   3. Moderate vascular dementia without behavioral disturbance, psychotic disturbance, mood disturbance, or anxiety (HCC) -slow progress decline noted, has support from grandson but has noticed more trouble with ADLS.    Return in about 6 months (around 12/01/2023) for routine follow up .:  Harlei Lehrmann K. [facility]ag[facility] [phone]

## 2023-06-02 LAB — CBC WITH DIFFERENTIAL/PLATELET
Absolute Monocytes: 529 cells/uL (ref 200–950)
Basophils Absolute: 39 cells/uL (ref 0–200)
Basophils Relative: 0.8 %
Eosinophils Absolute: 162 cells/uL (ref 15–500)
Eosinophils Relative: 3.3 %
HCT: 32.3 % — ABNORMAL LOW (ref 35.0–45.0)
Hemoglobin: 10.8 g/dL — ABNORMAL LOW (ref 11.7–15.5)
Lymphs Abs: 1936 cells/uL (ref 850–3900)
MCH: 31.7 pg (ref 27.0–33.0)
MCHC: 33.4 g/dL (ref 32.0–36.0)
MCV: 94.7 fL (ref 80.0–100.0)
MPV: 10.8 fL (ref 7.5–12.5)
Monocytes Relative: 10.8 %
Neutro Abs: 2234 cells/uL (ref 1500–7800)
Neutrophils Relative %: 45.6 %
Platelets: 178 10*3/uL (ref 140–400)
RBC: 3.41 10*6/uL — ABNORMAL LOW (ref 3.80–5.10)
RDW: 12.2 % (ref 11.0–15.0)
Total Lymphocyte: 39.5 %
WBC: 4.9 10*3/uL (ref 3.8–10.8)

## 2023-06-02 LAB — BASIC METABOLIC PANEL WITH GFR
BUN/Creatinine Ratio: 27 (calc) — ABNORMAL HIGH (ref 6–22)
BUN: 30 mg/dL — ABNORMAL HIGH (ref 7–25)
CO2: 29 mmol/L (ref 20–32)
Calcium: 9.5 mg/dL (ref 8.6–10.4)
Chloride: 104 mmol/L (ref 98–110)
Creat: 1.11 mg/dL — ABNORMAL HIGH (ref 0.60–0.95)
Glucose, Bld: 89 mg/dL (ref 65–99)
Potassium: 4.6 mmol/L (ref 3.5–5.3)
Sodium: 139 mmol/L (ref 135–146)
eGFR: 49 mL/min/{1.73_m2} — ABNORMAL LOW (ref >=60)

## 2023-06-03 LAB — URINE CULTURE
MICRO NUMBER:: 15085114
SPECIMEN QUALITY:: ADEQUATE

## 2023-06-04 ENCOUNTER — Ambulatory Visit (INDEPENDENT_AMBULATORY_CARE_PROVIDER_SITE_OTHER): Payer: PPO | Admitting: Nurse Practitioner

## 2023-06-04 ENCOUNTER — Encounter: Payer: Self-pay | Admitting: Nurse Practitioner

## 2023-06-04 ENCOUNTER — Other Ambulatory Visit: Payer: Self-pay | Admitting: Nurse Practitioner

## 2023-06-04 DIAGNOSIS — Z0000 Encounter for general adult medical examination without abnormal findings: Secondary | ICD-10-CM | POA: Diagnosis not present

## 2023-06-04 DIAGNOSIS — N390 Urinary tract infection, site not specified: Secondary | ICD-10-CM

## 2023-06-04 MED ORDER — CIPROFLOXACIN HCL 250 MG PO TABS: 250.0000 mg | ORAL_TABLET | Freq: Two times a day (BID) | ORAL | 0 refills | Status: AC

## 2023-06-04 NOTE — Progress Notes (Signed)
   This service is provided via telemedicine  No vital signs collected/recorded due to the encounter was a telemedicine visit.   Location of patient (ex: home, work):  Home  Patient consents to a telephone visit: Yes, see telephone visit dated 12/21/2022  Location of the provider (ex: office, home):  [facility], Office   Name of any referring provider:  N/A  Names of all persons participating in the telemedicine service and their role in the encounter:  S.Chrae B/CMA, Abbey Chatters, NP, Lucila Maine Ivin Booty), and Patient   Time spent on call:  16 min with medical assistant

## 2023-06-04 NOTE — Progress Notes (Signed)
Subjective:   Alison Brewer is a 88 y.o. female who presents for Medicare Annual (Subsequent) preventive examination.  Review of Systems     Cardiac Risk Factors include: advanced age (>48men, >15 women);sedentary lifestyle     Objective:    There were no vitals filed for this visit. There is no height or weight on file to calculate BMI.     06/01/2023    3:34 PM 03/19/2023    3:20 PM 01/15/2023   10:51 AM 12/21/2022    9:07 AM 09/01/2022   10:20 AM 06/02/2022   10:58 AM 05/30/2022   11:14 AM  Advanced Directives  Does Patient Have a Medical Advance Directive? Yes Yes Yes Yes Yes Yes No  Type of Social research officer, government Power of [employer] Power of Attorney Healthcare Power of Attorney Living will;Healthcare Power of Attorney   Does patient want to make changes to medical advance directive? No - Patient declined No - Patient declined No - Patient declined No - Patient declined No - Patient declined    Copy of Healthcare Power of Attorney in Chart? Yes - validated most recent copy scanned in chart (See row information) Yes - validated most recent copy scanned in chart (See row information) No - copy requested No - copy requested Yes - validated most recent copy scanned in chart (See row information)    Would patient like information on creating a medical advance directive?       No - Patient declined    Current Medications (verified) Outpatient Encounter Medications as of 06/04/2023  Medication Sig   albuterol (VENTOLIN HFA) 108 (90 Base) MCG/ACT inhaler Inhale 1-2 puffs into the lungs every 6 (six) hours as needed for wheezing or shortness of breath.   alendronate (FOSAMAX) 70 MG tablet Take 1 tablet (70 mg total) by mouth every 7 (seven) days. Take with a full glass of water on an empty stomach.   aspirin EC 81 MG tablet Take 81 mg by mouth daily. Swallow whole.   Calcium Carb-Cholecalciferol (CALCIUM 600+D3 PO)  Take 1 tablet by mouth 2 (two) times daily.   memantine (NAMENDA) 5 MG tablet Take 5 mg by mouth in the morning and at bedtime.   Multiple Vitamin (MULTIVITAMIN) tablet Take 1 tablet by mouth daily.   sodium chloride (MURO 128) 2 % ophthalmic solution Place 1 drop into both eyes daily.   [DISCONTINUED] Calcium Carbonate (CALCIUM 500 PO) Take 1 capsule by mouth 2 (two) times daily.   No facility-administered encounter medications on file as of 06/04/2023.    Allergies (verified) Patient has no known allergies.   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/EDC   HTN (hypertension)    Hyperlipidemia    Mild cardiomegaly    Past Surgical History:  Procedure Laterality Date   BREAST BIOPSY Right 1990   neg   Family History  Problem Relation Age of Onset   Pancreatic cancer Mother    Coronary artery disease Father    Cancer Brother    Breast cancer Maternal Aunt    Social History   Socioeconomic History   Marital status: Widowed    Spouse name: Not on file   Number of children: Not on file   Years of education: 14  Highest education level: Not on file  Occupational History   Not on file  Tobacco Use   Smoking status: Former    Packs/day: 1.50    Years: 18.00    Additional pack years: 0.00    Total pack years: 27.00    Types: Cigarettes    Quit date: 28    Years since quitting: 44.4   Smokeless tobacco: Never  Vaping Use   Vaping Use: Never used  Substance and Sexual Activity   Alcohol use: Never   Drug use: Never   Sexual activity: Not Currently    Birth control/protection: None  Other Topics Concern   Not on file  Social History Narrative   Right handed   Drink no coffe   One story home   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   Social Determinants of Research scientist (physical sciences) Strain: Not on file  Food Insecurity: Not on file  Transportation Needs: Not on file  Physical Activity: Not on file  Stress: Not on file  Social Connections: Not on file    Tobacco Counseling Counseling given: Not Answered   Clinical Intake:  Pre-visit preparation completed: Yes  Pain : No/denies pain     BMI - recorded: 21 Nutritional Risks: None  How often do you need to have someone help you when you read instructions, pamphlets, or other written materials from your doctor or pharmacy?: 5 - Always  Diabetic?no         Activities of Daily Living    06/04/2023    1:23 PM  In your present state of health, do you have any difficulty performing the following activities:  Hearing? 0  Vision? 1  Difficulty concentrating or making decisions? 1  Walking or climbing stairs? 1  Comment uses walker  Dressing or bathing? 1  Doing errands, shopping? 1  Preparing Food and eating ? Y  Using the Toilet? Y  In the past six months, have you accidently leaked urine? Y  Do you have problems with loss of bowel control? N  Managing your Medications? Y  Managing your Finances? Y  Housekeeping or managing your Housekeeping? Y    Patient Care Team: Sharon Seller, NP as PCP - General (Geriatric Medicine) Corky Crafts, MD as PCP - Cardiology (Cardiology)  Indicate any recent Medical Services you may have received from other than [facility] providers in the past year (date may be approximate).     Assessment:   This is a routine wellness examination for [place].  Hearing/Vision screen Hearing Screening - Comments:: Wears hearing aids and they are working fine Vision Screening - Comments:: Last eye eye exam less than 12 months ago with Dr.Groat  Dietary issues and exercise activities discussed:     Goals Addressed   None    Depression Screen    06/04/2023    1:02 PM 04/09/2023    1:21 PM 03/19/2023    3:19 PM 05/30/2022   11:02 AM 05/30/2022    8:02 AM   PHQ 2/9 Scores  PHQ - 2 Score 0 0 0 0 0    Fall Risk    06/04/2023    1:02 PM 06/01/2023    3:33 PM 04/09/2023    1:21 PM 03/19/2023    3:19 PM 01/15/2023   10:51 AM  Fall Risk   Falls in the past year? 1 1 0 1 1  Number falls in past yr: 1 0 0 1 0  Injury with Fall? 0 1 0 1 1  Risk for fall due to : No Fall Risks History of fall(s);Impaired balance/gait;Impaired mobility History of fall(s) History of fall(s) History of fall(s);Impaired balance/gait;Impaired mobility  Follow up Falls evaluation completed Falls evaluation completed;Education provided;Falls prevention discussed Falls evaluation completed Falls evaluation completed Falls evaluation completed;Education provided;Falls prevention discussed    FALL RISK PREVENTION PERTAINING TO THE HOME:  Any stairs in or around the home? Yes  If so, are there any without handrails? No  Home free of loose throw rugs in walkways, pet beds, electrical cords, etc? Yes  Adequate lighting in your home to reduce risk of falls? Yes   ASSISTIVE DEVICES UTILIZED TO PREVENT FALLS:  Life alert? No  Use of a cane, walker or w/c? Yes  Grab bars in the bathroom? No  Shower chair or bench in shower? Yes  Elevated toilet seat or a handicapped toilet? No   TIMED UP AND GO:  Was the test performed? No .    Cognitive Function:      05/05/2022   10:00 AM  Montreal Cognitive Assessment   Visuospatial/ Executive (0/5) 0  Naming (0/3) 3  Attention: Read list of digits (0/2) 2  Attention: Read list of letters (0/1) 1  Attention: Serial 7 subtraction starting at 100 (0/3) 0  Language: Repeat phrase (0/2) 1  Language : Fluency (0/1) 0  Abstraction (0/2) 0  Delayed Recall (0/5) 0  Orientation (0/6) 4  Total 11  Adjusted Score (based on education) 12      06/04/2023    1:05 PM 05/30/2022   11:14 AM  6CIT Screen  What Year? 4 points 0 points  What month? 3 points 0 points  What time? 3 points 3 points  Count back from 20 4 points 4 points   Months in reverse 4 points 4 points  Repeat phrase 8 points 10 points  Total Score 26 points 21 points    Immunizations Immunization History  Administered Date(s) Administered   Fluad Quad(high Dose 65+) 09/01/2022   Influenza Split 09/25/2014   Influenza-Unspecified 09/25/2014, 09/09/2015, 10/04/2017, 06/02/2019   PFIZER Comirnaty(Gray Top)Covid-19 Tri-Sucrose Vaccine 02/17/2020, 03/22/2020   Pneumococcal Conjugate-13 09/09/2015   Pneumococcal Polysaccharide-23 01/01/2017   Tdap 01/09/2023   Zoster Recombinat (Shingrix) 04/06/2020   Zoster, Live 05/26/2010    TDAP status: Up to date  Flu Vaccine status: Up to date  Pneumococcal vaccine status: Up to date  Covid-19 vaccine status: Information provided on how to obtain vaccines.   Qualifies for Shingles Vaccine? Yes   Zostavax completed No   Shingrix Completed?: No.    Education has been provided regarding the importance of this vaccine. Patient has been advised to call insurance company to determine out of pocket expense if they have not yet received this vaccine. Advised may also receive vaccine at local pharmacy or Health Dept. Verbalized acceptance and understanding.  Screening Tests Health Maintenance  Topic Date Due   COVID-19 Vaccine (3 - Pfizer risk series) 04/19/2020   Zoster Vaccines- Shingrix (2 of 2) 06/01/2020   INFLUENZA VACCINE  07/19/2023   Medicare Annual Wellness (AWV)  06/03/2024   DTaP/Tdap/Td (2 - Td or Tdap) 01/09/2033   Pneumonia Vaccine 49+ Years old  Completed   DEXA SCAN  Completed   HPV VACCINES  Aged Out    Health Maintenance  Health Maintenance Due  Topic Date Due   COVID-19 Vaccine (3 - Pfizer risk series) 04/19/2020   Zoster Vaccines- Shingrix (2 of 2)  06/01/2020    Colorectal cancer screening: No longer required.   Mammogram status: No longer required due to age.  Bone Density status: Completed 12/20/2022. Results reflect: Bone density results: OSTEOPOROSIS. Repeat every 2  years.  Lung Cancer Screening: (Low Dose CT Chest recommended if Age 37-80 years, 30 pack-year currently smoking OR have quit w/in 15years.) does not qualify.   Lung Cancer Screening Referral: na  Additional Screening:  Hepatitis C Screening: does not qualify; Completed na  Vision Screening: Recommended annual ophthalmology exams for early detection of glaucoma and other disorders of the eye. Is the patient up to date with their annual eye exam?  Yes  Who is the provider or what is the name of the office in which the patient attends annual eye exams? Dr Dione Booze If pt is not established with a provider, would they like to be referred to a provider to establish care? No .   Dental Screening: Recommended annual dental exams for proper oral hygiene  Community Resource Referral / Chronic Care Management: CRR required this visit?  No   CCM required this visit?  No      Plan:     I have personally reviewed and noted the following in the patient's chart:   Medical and social history Use of alcohol, tobacco or illicit drugs  Current medications and supplements including opioid prescriptions. Patient is not currently taking opioid prescriptions. Functional ability and status Nutritional status Physical activity Advanced directives List of other physicians Hospitalizations, surgeries, and ER visits in previous 12 months Vitals Screenings to include cognitive, depression, and falls Referrals and appointments  In addition, I have reviewed and discussed with patient certain preventive protocols, quality metrics, and best practice recommendations. A written personalized care plan for preventive services as well as general preventive health recommendations were provided to patient.     Sharon Seller, NP   06/04/2023    Virtual Visit via video  I connected with patient on 06/04/23 at  1:00 PM EDT by video and verified that I am speaking with the correct person using two  identifiers.  Location: Patient: home Provider: [facility]   I discussed the limitations, risks, security and privacy concerns of performing an evaluation and management service by telephone and the availability of in person appointments. I also discussed with the patient that there may be a patient responsible charge related to this service. The patient expressed understanding and agreed to proceed.   I discussed the assessment and treatment plan with the patient. The patient was provided an opportunity to ask questions and all were answered. The patient agreed with the plan and demonstrated an understanding of the instructions.   The patient was advised to call back or seek an in-person evaluation if the symptoms worsen or if the condition fails to improve as anticipated.  I provided 15 minutes of non-face-to-face time during this encounter.  Janene Harvey. [employer] printed and mailed

## 2023-06-04 NOTE — Patient Instructions (Signed)
  Ms. Rajchel , Thank you for taking time to come for your Medicare Wellness Visit. I appreciate your ongoing commitment to your health goals. Please review the following plan we discussed and let me know if I can assist you in the future.   To check on shingles vaccines    This is a list of the screening recommended for you and due dates:  Health Maintenance  Topic Date Due   COVID-19 Vaccine (3 - Pfizer risk series) 04/19/2020   Zoster (Shingles) Vaccine (2 of 2) 06/01/2020   Flu Shot  07/19/2023   Medicare Annual Wellness Visit  06/03/2024   DTaP/Tdap/Td vaccine (2 - Td or Tdap) 01/09/2033   Pneumonia Vaccine  Completed   DEXA scan (bone density measurement)  Completed   HPV Vaccine  Aged Out

## 2023-06-22 ENCOUNTER — Ambulatory Visit (INDEPENDENT_AMBULATORY_CARE_PROVIDER_SITE_OTHER): Payer: PPO | Admitting: Physician Assistant

## 2023-06-22 ENCOUNTER — Encounter: Payer: Self-pay | Admitting: Physician Assistant

## 2023-06-22 VITALS — Ht 64.0 in

## 2023-06-22 DIAGNOSIS — F0280 Dementia in other diseases classified elsewhere without behavioral disturbance: Secondary | ICD-10-CM | POA: Diagnosis not present

## 2023-06-22 DIAGNOSIS — G309 Alzheimer's disease, unspecified: Secondary | ICD-10-CM | POA: Diagnosis not present

## 2023-06-22 MED ORDER — MEMANTINE HCL 10 MG PO TABS: 10.0000 mg | ORAL_TABLET | Freq: Two times a day (BID) | ORAL | 11 refills | Status: AC

## 2023-06-22 NOTE — Progress Notes (Signed)
Assessment/Plan:   Memory Impairment  Alison Brewer is a very pleasant 88 y.o. [name] with a history of arthritis, hypertension, hyperlipidemia, and dementia likely due to Alzheimer's disease and vascular etiology seen today in follow up for memory loss.  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  Patient is currently on memantine 5 mg twice daily, tolerating well.  MMSE today however, shows a slight decline, at 17/30.  She admits to not participating in many ADLs, or social stimulation.    Follow up in 6  months. Continue memantine but increase to 10 mg twice daily, side effects discussed.    Increase social activity. Recommend good control of her cardiovascular risk factors Continue to control mood as per PCP    Subjective:    This patient is accompanied in the office by her grandson who supplements the history.  Previous records as well as any outside records available were reviewed prior to todays visit. Patient was last seen on 12/22/2022, with last MoCA on May 2023 at 12/30    Any changes in memory since last visit? " About the same".  She continues to have some difficulty remembering recent conversations and names of people.  She continues to show difficulties with writing.  If she over thinks or distress, "it may be harder ".  She is able to read.  She does not enjoy doing crossword puzzles or word finding.  She likes to go to church twice a week.  Long-term memory is good.  She is able to comprehend well when using her hearing aids (she did not bring them today) Repeats oneself?  Endorsed Disoriented when walking into a room?  Patient denies    Leaving objects in unusual places?  May misplace things but not in unusual places   Wandering behavior?   denies   Any personality changes since last visit?  denies   Any worsening depression?:  denies   Hallucinations or paranoia?  denies   Seizures? denies    Any sleep changes? Sleeps well.  Denies vivid dreams, REM behavior or sleepwalking   Sleep apnea?   denies   Any hygiene concerns? denies  Independent of bathing and dressing?  Endorsed  Does the patient needs help with medications?  Lucila [place] is in charge   Who is in charge of the finances?  Guardian of State is in charge     Any changes in appetite?  Appetite is good. Patient have trouble swallowing? denies   Does the patient cook? No Any headaches?   denies   Chronic back pain  denies   Ambulates with difficulty?  She needs a  walker  for stability, due to arthritis  Recent falls or head injuries? She fell in January so she uses a walker.     Unilateral weakness, numbness or tingling? denies   Any tremors?  denies   Any anosmia?  Patient denies   Any incontinence of urine?  Endorsed, uses Depends, recent UTI  2 weeks ago needing antibiotics .  Any bowel dysfunction? denies      Patient lives with her grandson. Does the patient drive? No longer drives        Initial visit 04/2022  How long did patient have memory difficulties? Every year is worse, but these may have  been present for about 15 years, and yet, over the last 10 years has been wears.  Changes have been progressive, but it was brought to her grandsons attention when she could not remember family members birthdays which was unusual for her.  Patient is in denial that there is anything wrong with her.   The person that noticed the biggest changes was her grandson who lives with her.  She is very concerned about numbers, she cannot "put them together ".  In addition, if the topic is changed, she may be "stuck on the prior subject ".  This has been noticed by myself during this visit as well.   She has had a recent UTI, and dehydration, in which she had acute mental status  changes, "she bounced back, except for the numbers ". Patient lives with: Her grandson and his wife from [place], who moved to live with her about 3 years ago.  Her husband died in 02-Jul-2015.   repeats oneself? She always did but is worse over the last 3 y. Disoriented when walking into a room?  Patient denies   Leaving objects in unusual places?  Patient denies.  She is very organized, and she puts the silverware back in the drawer according to her grandson.  However, she likes to hoard, does not like to throw things, although does not living in "weird "places.  Ambulates  with difficulty?   Patient denies. She walks more than before.  She lives in [place] house, and likes to walk around the house frequently.  Recent falls?  Patient denies   Any head injuries?  Patient denies   History of seizures?   Patient denies   Wandering behavior?  Patient denies   Patient drives?   Patient no longer drives she had a couple of episodes getting lost when going to the pizza place  Any mood changes such irritability agitation?  Patient denies   Any history of depression?:  Patient denies   Hallucinations?  Patient denies   Paranoia?  Patient denies   Patient reports that he sleeps well without vivid dreams, REM behavior or sleepwalking   History of sleep apnea?  Patient denies   Any hygiene concerns?  Patient denies   Independent of bathing and dressing?  Endorsed  Does the patient needs help with medications?  Patient denies.  She never took many medications. Who is in charge of the finances?  Stepchildren  Patient have trouble swallowing? Patient denies   Does the patient cook? Her grandson does  Any kitchen accidents such as leaving the stove on? Patient denies   Any headaches?  Patient denies   The double vision? Patient denies   Any focal numbness or tingling?  " I am not sure " Chronic back pain Patient denies   Unilateral weakness?  Patient denies   Any tremors?  Patient denies   Any history of  anosmia? Decrease in the sense of smell , and decreased hearing  Any incontinence of urine? Endorses incontinence  Patient had a recent E. Coli UTI  Any bowel dysfunction?   Patient occasionally uses prunes  History of heavy alcohol intake?  Patient denies   History of heavy tobacco use?  Patient denies   Family history of dementia?  father may have had, Alzheimer's disease    PREVIOUS MEDICATIONS:   CURRENT MEDICATIONS:  Outpatient Encounter Medications as of 06/22/2023  Medication Sig   albuterol (VENTOLIN HFA) 108 (90 Base) MCG/ACT inhaler Inhale 1-2 puffs into the lungs  every 6 (six) hours as needed for wheezing or shortness of breath.   alendronate (FOSAMAX) 70 MG tablet Take 1 tablet (70 mg total) by mouth every 7 (seven) days. Take with a full glass of water on an empty stomach.   aspirin EC 81 MG tablet Take 81 mg by mouth daily. Swallow whole.   Calcium Carb-Cholecalciferol (CALCIUM 600+D3 PO) Take 1 tablet by mouth 2 (two) times daily.   Multiple Vitamin (MULTIVITAMIN) tablet Take 1 tablet by mouth daily.   sodium chloride (MURO 128) 2 % ophthalmic solution Place 1 drop into both eyes daily.   [DISCONTINUED] memantine (NAMENDA) 5 MG tablet Take 5 mg by mouth in the morning and at bedtime.   memantine (NAMENDA) 10 MG tablet Take 1 tablet (10 mg total) by mouth 2 (two) times daily.   No facility-administered encounter medications on file as of 06/22/2023.       06/22/2023   12:00 PM  MMSE - Mini Mental State Exam  Orientation to time 0  Orientation to Place 5  Registration 3  Attention/ Calculation 0  Recall 2  Language- name 2 objects 2  Language- repeat 1  Language- follow 3 step command 3  Language- read & follow direction 1  Write a sentence 0  Copy design 0  Total score 17      05/05/2022   10:00 AM  Montreal Cognitive Assessment   Visuospatial/ Executive (0/5) 0  Naming (0/3) 3  Attention: Read list of digits (0/2) 2  Attention: Read list of letters (0/1) 1   Attention: Serial 7 subtraction starting at 100 (0/3) 0  Language: Repeat phrase (0/2) 1  Language : Fluency (0/1) 0  Abstraction (0/2) 0  Delayed Recall (0/5) 0  Orientation (0/6) 4  Total 11  Adjusted Score (based on education) 12    Objective:     PHYSICAL EXAMINATION:    VITALS:   Vitals:   06/22/23 1119  Height: 5\' 4"  (1.626 m)    GEN:  The patient appears stated age and is in NAD. HEENT:  Normocephalic, atraumatic.   Neurological examination:  General: NAD, well-groomed, appears stated age. Orientation: The patient is alert. Oriented to person, place and not to date. Cranial nerves: There is good facial symmetry.The speech is fluent and clear, tangential at times. No aphasia or dysarthria. Fund of knowledge is appropriate. Recent and remote memory are impaired. Attention and concentration are reduced.  Able to name objects and repeat phrases.  Hearing is intact to conversational tone.  She does show some difficulty trying to write a sentence or copying a figure.  Sensation: Sensation is intact to light touch throughout Motor: Strength is at least antigravity x4. DTR's 2/4 in UE/LE     Movement examination: Tone: There is normal tone in the UE/LE Abnormal movements:  no tremor.  No myoclonus.  No asterixis.   Coordination:  There is no decremation with RAM's. Normal finger to nose on the right, she has more difficulty understanding the command for the left hand. Gait and Station: The patient has some difficulty arising out of a deep-seated chair without the use of the hands, needs the walker. The patient's stride length is good.  Gait is cautious and narrow.    Thank you for allowing [place] the opportunity to participate in the care of this nice patient. Please do not hesitate to contact us for any questions or concerns.   Total time spent on today's visit was dedicated to this patient today,  preparing to see patient, examining the patient, ordering tests  and/or medications and counseling the patient, documenting clinical information in the EHR or other health record, independently interpreting results and communicating results to the patient/family, discussing treatment and goals, answering patient's questions and coordinating care.  Cc:  Sharon Seller, NP  Marlowe Kays 06/22/2023 12:25 PM

## 2023-06-22 NOTE — Patient Instructions (Signed)
It was a pleasure to see you today at our office.   Recommendations:  Follow up in 6 months  Increase  Memantine to 10 mg tablets  1 tablet twice daily.     Whom to call:  Memory  decline, memory medications: Call our office [phone]   For psychiatric meds, mood meds: Please have your primary care physician manage these medications.   Counseling regarding caregiver distress, including caregiver depression, anxiety and issues regarding community resources, adult day care programs, adult living facilities, or memory care questions:   Feel free to contact Misty Lisabeth Register, Social Worker at [phone]   For assessment of decision of mental capacity and competency:  Call Dr. Erick Blinks, geriatric psychiatrist at [phone]  For guidance in geriatric dementia issues please call [facility] [phone]  For guidance regarding [facility] and if placement were needed at the facility, contact Sidney Ace, Social Worker tel: [phone]  If you have any severe symptoms of a stroke, or other severe issues such as confusion,severe chills or fever, etc call 911 or go to the ER as you may need to be evaluated further   Feel free to visit Facebook page " [email or web address]" for tips of how to care for people with memory problems.         RECOMMENDATIONS FOR ALL PATIENTS WITH MEMORY PROBLEMS: 1. Continue to exercise (Recommend 30 minutes of walking everyday, or 3 hours every week) 2. Increase social interactions - continue going to [place] and enjoy social gatherings with friends and family 3. Eat healthy, avoid fried foods and eat more fruits and vegetables 4. Maintain adequate blood pressure, blood sugar, and blood cholesterol level. Reducing the risk of stroke and cardiovascular disease also helps promoting better memory. 5. Avoid stressful situations. Live a simple life and avoid aggravations. Organize your time and prepare for the next day in  anticipation. 6. Sleep well, avoid any interruptions of sleep and avoid any distractions in the bedroom that may interfere with adequate sleep quality 7. Avoid sugar, avoid sweets as there is a strong link between excessive sugar intake, diabetes, and cognitive impairment We discussed the Mediterranean diet, which has been shown to help patients reduce the risk of progressive memory disorders and reduces cardiovascular risk. This includes eating fish, eat fruits and green leafy vegetables, nuts like almonds and hazelnuts, walnuts, and also use olive oil. Avoid fast foods and fried foods as much as possible. Avoid sweets and sugar as sugar use has been linked to worsening of memory function.  There is always a concern of gradual progression of memory problems. If this is the case, then we may need to adjust level of care according to patient needs. Support, both to the patient and caregiver, should then be put into place.    The [employer] Association is here all day, every day for people facing Alzheimer's disease through our free 24/7 Helpline: [phone]. The Helpline provides reliable information and support to all those who need assistance, such as individuals living with memory loss, Alzheimer's or other dementia, caregivers, health care professionals and the public.  Our highly trained and knowledgeable staff can help you with: Understanding memory loss, dementia and Alzheimer's  Medications and other treatment options  General information about aging and brain health  Skills to provide quality care and to find the best care from professionals  Legal, financial and living-arrangement decisions Our Helpline also features: Confidential care consultation provided by master's level clinicians who can help with decision-making  support, crisis assistance and education on issues families face every day  Help in a caller's preferred language using our translation service that features more than 200  languages and dialects  Referrals to local community programs, services and ongoing support     FALL PRECAUTIONS: Be cautious when walking. Scan the area for obstacles that may increase the risk of trips and falls. When getting up in the mornings, sit up at the edge of the bed for a few minutes before getting out of bed. Consider elevating the bed at the head end to avoid drop of blood pressure when getting up. Walk always in a well-lit room (use night lights in the walls). Avoid area rugs or power cords from appliances in the middle of the walkways. Use a walker or a cane if necessary and consider physical therapy for balance exercise. Get your eyesight checked regularly.  FINANCIAL OVERSIGHT: Supervision, especially oversight when making financial decisions or transactions is also recommended.  HOME SAFETY: Consider the safety of the kitchen when operating appliances like stoves, microwave oven, and blender. Consider having supervision and share cooking responsibilities until no longer able to participate in those. Accidents with firearms and other hazards in the house should be identified and addressed as well.   ABILITY TO BE LEFT ALONE: If patient is unable to contact 911 operator, consider using LifeLine, or when the need is there, arrange for someone to stay with patients. Smoking is a fire hazard, consider supervision or cessation. Risk of wandering should be assessed by caregiver and if detected at any point, supervision and safe proof recommendations should be instituted.  MEDICATION SUPERVISION: Inability to self-administer medication needs to be constantly addressed. Implement a mechanism to ensure safe administration of the medications.   DRIVING: Regarding driving, in patients with progressive memory problems, driving will be impaired. We advise to have someone else do the driving if trouble finding directions or if minor accidents are reported. Independent driving assessment is  available to determine safety of driving.   If you are interested in the driving assessment, you can contact the following:  [employer] in [place] [phone]  [facility] [phone]  [facility] [phone] or [phone]      Mediterranean Diet A Mediterranean diet refers to food and lifestyle choices that are based on the traditions of countries located on the [employer]. This way of eating has been shown to help prevent certain conditions and improve outcomes for people who have chronic diseases, like kidney disease and heart disease. What are tips for following this plan? Lifestyle  Cook and eat meals together with your family, when possible. Drink enough fluid to keep your urine clear or pale yellow. Be physically active every day. This includes: Aerobic exercise like running or swimming. Leisure activities like gardening, walking, or housework. Get 7-8 hours of sleep each night. If recommended by your health care provider, drink red wine in moderation. This means 1 glass a day for nonpregnant women and 2 glasses a day for men. A glass of wine equals 5 oz (150 mL). Reading food labels  Check the serving size of packaged foods. For foods such as rice and pasta, the serving size refers to the amount of cooked product, not dry. Check the total fat in packaged foods. Avoid foods that have saturated fat or trans fats. Check the ingredients list for added sugars, such as corn syrup. Shopping  At the grocery store, buy most of your  food from the areas near the walls of the store. This includes: Fresh fruits and vegetables (produce). Grains, beans, nuts, and seeds. Some of these may be available in unpackaged forms or large amounts (in bulk). Fresh seafood. Poultry and eggs. Low-fat dairy products. Buy whole ingredients instead of prepackaged foods. Buy fresh fruits and vegetables in-season  from local farmers markets. Buy frozen fruits and vegetables in resealable bags. If you do not have access to quality fresh seafood, buy precooked frozen shrimp or canned fish, such as tuna, salmon, or sardines. Buy small amounts of raw or cooked vegetables, salads, or olives from the deli or salad bar at your store. Stock your pantry so you always have certain foods on hand, such as olive oil, canned tuna, canned tomatoes, rice, pasta, and beans. Cooking  Cook foods with extra-virgin olive oil instead of using butter or other vegetable oils. Have meat as a side dish, and have vegetables or grains as your main dish. This means having meat in small portions or adding small amounts of meat to foods like pasta or stew. Use beans or vegetables instead of meat in common dishes like chili or lasagna. Experiment with different cooking methods. Try roasting or broiling vegetables instead of steaming or sauteing them. Add frozen vegetables to soups, stews, pasta, or rice. Add nuts or seeds for added healthy fat at each meal. You can add these to yogurt, salads, or vegetable dishes. Marinate fish or vegetables using olive oil, lemon juice, garlic, and fresh herbs. Meal planning  Plan to eat 1 vegetarian meal one day each week. Try to work up to 2 vegetarian meals, if possible. Eat seafood 2 or more times a week. Have healthy snacks readily available, such as: Vegetable sticks with hummus. Greek yogurt. Fruit and nut trail mix. Eat balanced meals throughout the week. This includes: Fruit: 2-3 servings a day Vegetables: 4-5 servings a day Low-fat dairy: 2 servings a day Fish, poultry, or lean meat: 1 serving a day Beans and legumes: 2 or more servings a week Nuts and seeds: 1-2 servings a day Whole grains: 6-8 servings a day Extra-virgin olive oil: 3-4 servings a day Limit red meat and sweets to only a few servings a month What are my food choices? Mediterranean diet Recommended Grains:  Whole-grain pasta. Brown rice. Bulgar wheat. Polenta. Couscous. Whole-wheat bread. Orpah Cobb. Vegetables: Artichokes. Beets. Broccoli. Cabbage. Carrots. Eggplant. Green beans. Chard. Kale. Spinach. Onions. Leeks. Peas. Squash. Tomatoes. Peppers. Radishes. Fruits: Apples. Apricots. Avocado. Berries. Bananas. Cherries. Dates. Figs. Grapes. Lemons. Melon. Oranges. Peaches. Plums. Pomegranate. Meats and other protein foods: Beans. Almonds. Sunflower seeds. Pine nuts. Peanuts. Cod. Salmon. Scallops. Shrimp. Tuna. Tilapia. Clams. Oysters. Eggs. Dairy: Low-fat milk. Cheese. Greek yogurt. Beverages: Water. Red wine. Herbal tea. Fats and oils: Extra virgin olive oil. Avocado oil. Grape seed oil. Sweets and desserts: [place] yogurt with honey. Baked apples. Poached pears. Trail mix. Seasoning and other foods: Basil. Cilantro. Coriander. Cumin. Mint. Parsley. Sage. Rosemary. Tarragon. Garlic. Oregano. Thyme. Pepper. Balsalmic vinegar. Tahini. Hummus. Tomato sauce. Olives. Mushrooms. Limit these Grains: Prepackaged pasta or rice dishes. Prepackaged cereal with added sugar. Vegetables: Deep fried potatoes (french fries). Fruits: Fruit canned in syrup. Meats and other protein foods: Beef. Pork. Lamb. Poultry with skin. Hot dogs. Tomasa Blase. Dairy: Ice cream. Sour cream. Whole milk. Beverages: Juice. Sugar-sweetened soft drinks. Beer. Liquor and spirits. Fats and oils: Butter. Canola oil. Vegetable oil. Beef fat (tallow). Lard. Sweets and desserts: Cookies. Cakes. Pies. Candy. Seasoning and other foods: Mayonnaise.  Premade sauces and marinades. The items listed may not be a complete list. Talk with your dietitian about what dietary choices are right for you. Summary The Mediterranean diet includes both food and lifestyle choices. Eat a variety of fresh fruits and vegetables, beans, nuts, seeds, and whole grains. Limit the amount of red meat and sweets that you eat. Talk with your health care provider about  whether it is safe for you to drink red wine in moderation. This means 1 glass a day for nonpregnant women and 2 glasses a day for men. A glass of wine equals 5 oz (150 mL). This information is not intended to replace advice given to you by your health care provider. Make sure you discuss any questions you have with your health care provider. Document Released: 07/27/2016 Document Revised: 08/29/2016 Document Reviewed: 07/27/2016 Elsevier Interactive Patient Education  2017 [employer].

## 2023-07-01 DIAGNOSIS — N390 Urinary tract infection, site not specified: Secondary | ICD-10-CM | POA: Insufficient documentation

## 2023-07-01 DIAGNOSIS — M1990 Unspecified osteoarthritis, unspecified site: Secondary | ICD-10-CM | POA: Insufficient documentation

## 2023-07-01 NOTE — Progress Notes (Signed)
Name: Alison Brewer DOB: Oct 28, 1935 MRN: [id number]  History of Present Illness: Ms. Alison Brewer is a 88 y.o. female who presents today as a new patient at [facility] [place]. All available relevant medical records have been reviewed. She is accompanied by her grandson [name] (who lives with her and is her medical guardian / caretaker) and assists with giving history due to patient's Alzheimer's dementia.  She reports chief complaint of recurrent UTls.  Urine culture results in past 12 months: - 04/09/2023: Positive for Klebsiella pneumoniae - 06/01/2023: Positive for Klebsiella pneumoniae  Urinary Symptoms: Her grandson reports at least 3 UTl's in the last year. When present, UTI symptoms include altered mental status, occasional urgency, and possible increased urinary frequency; denies patient reports of dysuria. Her grandson is concerned she may be having acute UTI symptoms (increased confusion).  At baseline: She denies dysuria, gross hematuria, hesitancy, straining to void, or sensations of incomplete emptying.  Denies daytime urinary incontinence but reports nocturnal enuresis. Wears diapers overnight.   She denies history of pyelonephritis.  She denies history of kidney stones.  Vaginal / prolapse Symptoms: She denies vaginal bulge sensation.  She denies seeing a vaginal bulge.  She denies vaginal pain, bleeding, or discharge.  Reports sensation of vaginal dryness. Denies prior use of topical vaginal estrogen cream.  Bowel Symptoms: She denies constipation or diarrhea. States she has a bowel movement every other day or more often.    Past OB/GYN History: OB History   No obstetric history on file.    She denies being sexually active.   Fall Screening: Do you usually have a device to assist in your mobility? Yes - walker   Medications: Current Outpatient Medications  Medication Sig Dispense Refill   estradiol (ESTRING) 7.5 MCG/24HR vaginal ring Follow  package directions. Medicated ring inserted vaginally. Exchange every 3 months (either at home or at a schedule office visit with provider). 1 each 3   albuterol (VENTOLIN HFA) 108 (90 Base) MCG/ACT inhaler Inhale 1-2 puffs into the lungs every 6 (six) hours as needed for wheezing or shortness of breath. 18 g 0   alendronate (FOSAMAX) 70 MG tablet Take 1 tablet (70 mg total) by mouth every 7 (seven) days. Take with a full glass of water on an empty stomach. 4 tablet 11   aspirin EC 81 MG tablet Take 81 mg by mouth daily. Swallow whole.     Calcium Carb-Cholecalciferol (CALCIUM 600+D3 PO) Take 1 tablet by mouth 2 (two) times daily.     memantine (NAMENDA) 10 MG tablet Take 1 tablet (10 mg total) by mouth 2 (two) times daily. 60 tablet 11   Multiple Vitamin (MULTIVITAMIN) tablet Take 1 tablet by mouth daily.     sodium chloride (MURO 128) 2 % ophthalmic solution Place 1 drop into both eyes daily.     No current facility-administered medications for this visit.    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   HTN (hypertension)    Hyperlipidemia    Mild cardiomegaly    Past Surgical History:  Procedure Laterality Date   BREAST BIOPSY Right 1990   neg   Family History  Problem Relation Age of Onset   Pancreatic cancer Mother    Coronary artery disease Father    Cancer  Brother    Breast cancer Maternal Aunt    Social History   Socioeconomic History   Marital status: Widowed    Spouse name: Not on file   Number of children: Not on file   Years of education: 12   Highest education level: Not on file  Occupational History   Not on file  Tobacco Use   Smoking status: Former    Current packs/day: 0.00    Average packs/day: 1.5 packs/day for 18.0 years (27.0 ttl pk-yrs)    Types: Cigarettes     Start date: 13    Quit date: 42    Years since quitting: 44.5   Smokeless tobacco: Never  Vaping Use   Vaping status: Never Used  Substance and Sexual Activity   Alcohol use: Never   Drug use: Never   Sexual activity: Not Currently    Birth control/protection: None  Other Topics Concern   Not on file  Social History Narrative   Right handed   Drink no coffe   One story home   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  OBJECTIVE Vitals:   07/02/23 1054  BP: 112/69  Pulse: 63  Temp: 98.1 F (36.7 C)   There is no height or weight on file to calculate BMI.  Physical Examination Constitutional: No obvious distress; patient is non-toxic appearing  Cardiovascular: No visible lower extremity edema.  Respiratory: The patient does not have audible wheezing/stridor; respirations do not appear labored  Gastrointestinal: Abdomen non-distended Musculoskeletal: Normal ROM of UEs  Skin: No obvious rashes/open sores  Neurologic: CN 2-12 grossly intact Psychiatric: Normal affect  Hematologic/Lymphatic/Immunologic: No obvious bruises or sites of spontaneous bleeding  UA: negative  PVR: 0 ml  ASSESSMENT Recurrent UTI - Plan: Urinalysis, Routine w reflex microscopic, BLADDER SCAN AMB NON-IMAGING, estradiol (ESTRING) 7.5 MCG/24HR vaginal ring  Vaginal atrophy - Plan: estradiol (ESTRING) 7.5 MCG/24HR vaginal ring  Dementia due to  Alzheimer's disease (HCC) - Plan: estradiol (ESTRING) 7.5 MCG/24HR vaginal ring  Recurrent UTls:  We discussed the possible etiologies of recurrent UTls including ascending infection related to intercourse; vaginal atrophy; transmural infection that has been treated incompletely; urinary tract stones; incomplete bladder emptying with urinary stasis; kidney or bladder tumor; urethral diverticulum; and colonization of  vagina and urinary tract with pathologic, adherent organisms.   For UTI prevention we discussed options including: Adequate fluid intake (>1.5 liters/day) to flush out the urinary tract. - Go to the bathroom to urinate every 4-6 hours while awake to minimize urinary stasis / bacterial overgrowth in the bladder. - Proanthocyanidin (PAC) supplement 36 mg daily; must be soluble (insoluble form of PAC will be ineffective). Recommend Ellura. D-mannose 2 g daily Vitamin C supplement Probiotic to maintain healthy vaginal microbiome - Topical vaginal estrogen for vaginal atrophy. The etiology and consequences of urogenital epithelial atrophy was explained to patient. The thinning of the epithelium of the urethra can contribute to urinary urgency and frequency syndromes.  In addition, the normal bacterial flora that colonizes the perineum may contribute to UTI risk because the thin urethral epithelium allows the bacteria to become adherent and the change in vaginal pH can disrupt the vaginal / urethral microbiome and allow for bacterial overgrowth. Patient was advised that topical vaginal estrogen replacement will take about 3 months to restore the vaginal pH and may sting/burn initially due to severe dryness, which will improve with ongoing treatment. OK to have sex with any of the topical vaginal estrogen replacement options. We discussed that there have been studies that evaluate use of low-dose intravaginal estrogen cream that shows minimal systemic absorption that is negligible after 3 weeks.  There have been no studies indicating increased risk of contributing to breast cancer development or recurrence.  If recurrent UTls persist, may consider UTI prophylaxis with a daily low dose antibiotic in the future.  For management of acute UTI symptoms: - Patient was advised that if/when UTI-like symptoms occur they can call our office to speak with a nurse, who can place an order for urinalysis (with reflex to urine culture). They may then proceed to a [facility] to provide their urine sample. This is required for evaluation in order for their Urology provider to make informed treatment recommendation(s), which may or may not include an antibiotic prescription. - Discussed option to take over-the-counter Pyridium (commonly known under the AZO brand name) for bladder pain. No more than 3 days at a time.  Patient / grandson ultimately decided to start with topical vaginal estrogen in the form of Estring due to her dementia. They will also consider OTC supplements for UTI prevention. Handout provided.  Will plan for follow up in 2 weeks for Estring placement by provider. Pt verbalized understanding and agreement. All questions were answered.  PLAN Advised the following: 1. Adequate fluid intake (>1.5 liters/day) to flush out the urinary tract. 2. Urinate every 4-6 hours while awake to minimize urinary stasis/ bacterial overgrowth in the bladder. 3. Consider OTC supplements for UTI prevention. 4. Return in about 2 weeks (around 07/16/2023) for UA & pelvic exam for insertion of Estring.  Orders Placed This Encounter  Procedures   Urinalysis, Routine w reflex microscopic   BLADDER SCAN AMB NON-IMAGING    It has been explained that the patient is to follow regularly with their PCP in addition to all other providers involved in their care and to follow instructions provided by these respective offices. Patient advised to contact urology clinic if any urologic-pertaining questions,  concerns, new symptoms or problems arise in the interim period.  Patient Instructions  Recommendations regarding UTI prevention / management:  When UTI symptoms occur: Call urology office to request order for urine culture. We recommend waiting for urine culture result prior to use of any antibiotics.  For bladder pain/ burning with urination: Over the counter Pyridium (phenazopyridine) as needed (commonly known under the "AZO" brand). No more than 3 days consecutively at a time due to risk for methemoglobinemia, liver function issues, and bone health damage with long term use of Pyridium.  Routine use for UTI prevention: - Topical vaginal estrogen for vaginal atrophy. Adequate fluid intake (>1.5 liters/day) to flush out the urinary tract. - Go to the bathroom to urinate every 4-6 hours while awake to minimize urinary stasis / bacterial overgrowth in the bladder. - Proanthocyanidin (PAC) supplement 36 mg daily; must be soluble (insoluble form of PAC will be ineffective). Recommended brand: Ellura. This is an over-the-counter supplement (often must be found/  purchased online) supplement derived from cranberries with concentrated active component: Proanthocyanidin (PAC) 36 mg daily. Decreases bacterial adherence to bladder lining. Not recommended for patients with interstitial cystitis due to acidity. - D-mannose powder (2 grams daily). This is an over-the-counter supplement which decreases bacterial adherence to bladder lining (it is a sugar that inhibits bacterial adherence to urothelial cells by binding to the pili of enteric bacteria). Take as per manufacturer recommendation. Can be used as an alternative or in addition to the concentrated cranberry supplement. Not recommended for diabetic patients due to sugar content. - Vitamin C supplement to acidify urine to minimize bacterial growth. Not recommended for patients with interstitial cystitis due to acidity. - Probiotic to maintain healthy  vaginal microbiome to suppress bacteria at urethral opening. Brand recommendations: Darrold Junker (includes probiotic & D-mannose ), Feminine Balance (highest concentration of lactobacillus) or Hyperbiotic Pro 15.  Note for patients with diabetes: You may read about D-mannose powder for UTI prevention. That is an over the-counter supplement which decreases bacterial adherence to bladder lining. I would NOT advise that for you as a person with diabetes due to its sugar content.   Electronically signed by:  Donnita Falls, MSN, FNP-C, CUNP 07/02/2023 1:14 PM

## 2023-07-02 ENCOUNTER — Encounter: Payer: Self-pay | Admitting: Urology

## 2023-07-02 ENCOUNTER — Ambulatory Visit (INDEPENDENT_AMBULATORY_CARE_PROVIDER_SITE_OTHER): Payer: PPO | Admitting: Urology

## 2023-07-02 VITALS — BP 112/69 | HR 63 | Temp 98.1°F

## 2023-07-02 DIAGNOSIS — Z87440 Personal history of urinary (tract) infections: Secondary | ICD-10-CM

## 2023-07-02 DIAGNOSIS — N952 Postmenopausal atrophic vaginitis: Secondary | ICD-10-CM

## 2023-07-02 DIAGNOSIS — I10 Essential (primary) hypertension: Secondary | ICD-10-CM | POA: Insufficient documentation

## 2023-07-02 DIAGNOSIS — N390 Urinary tract infection, site not specified: Secondary | ICD-10-CM

## 2023-07-02 DIAGNOSIS — E785 Hyperlipidemia, unspecified: Secondary | ICD-10-CM | POA: Insufficient documentation

## 2023-07-02 DIAGNOSIS — G309 Alzheimer's disease, unspecified: Secondary | ICD-10-CM | POA: Diagnosis not present

## 2023-07-02 DIAGNOSIS — F0280 Dementia in other diseases classified elsewhere without behavioral disturbance: Secondary | ICD-10-CM

## 2023-07-02 DIAGNOSIS — Z8589 Personal history of malignant neoplasm of other organs and systems: Secondary | ICD-10-CM | POA: Insufficient documentation

## 2023-07-02 DIAGNOSIS — Z87891 Personal history of nicotine dependence: Secondary | ICD-10-CM | POA: Insufficient documentation

## 2023-07-02 LAB — URINALYSIS, ROUTINE W REFLEX MICROSCOPIC
Bilirubin, UA: NEGATIVE
Glucose, UA: NEGATIVE
Ketones, UA: NEGATIVE
Leukocytes,UA: NEGATIVE
Nitrite, UA: NEGATIVE
Protein,UA: NEGATIVE
RBC, UA: NEGATIVE
Specific Gravity, UA: 1.02 (ref 1.005–1.030)
Urobilinogen, Ur: 1 mg/dL (ref 0.2–1.0)
pH, UA: 6 (ref 5.0–7.5)

## 2023-07-02 LAB — BLADDER SCAN AMB NON-IMAGING: Scan Result: 0

## 2023-07-02 MED ORDER — ESTRING 2 MG VA RING: VAGINAL_RING | VAGINAL | 3 refills | Status: DC

## 2023-07-02 NOTE — Progress Notes (Signed)
 post void residual=0 ?

## 2023-07-02 NOTE — Patient Instructions (Addendum)
Recommendations regarding UTI prevention / management:  When UTI symptoms occur: Call urology office to request order for urine culture. We recommend waiting for urine culture result prior to use of any antibiotics.  For bladder pain/ burning with urination: Over the counter Pyridium (phenazopyridine) as needed (commonly known under the "AZO" brand). No more than 3 days consecutively at a time due to risk for methemoglobinemia, liver function issues, and bone health damage with long term use of Pyridium.  Routine use for UTI prevention: - Topical vaginal estrogen for vaginal atrophy. Adequate fluid intake (>1.5 liters/day) to flush out the urinary tract. - Go to the bathroom to urinate every 4-6 hours while awake to minimize urinary stasis / bacterial overgrowth in the bladder. - Proanthocyanidin (PAC) supplement 36 mg daily; must be soluble (insoluble form of PAC will be ineffective). Recommended brand: Ellura. This is an over-the-counter supplement (often must be found/ purchased online) supplement derived from cranberries with concentrated active component: Proanthocyanidin (PAC) 36 mg daily. Decreases bacterial adherence to bladder lining. Not recommended for patients with interstitial cystitis due to acidity. - D-mannose powder (2 grams daily). This is an over-the-counter supplement which decreases bacterial adherence to bladder lining (it is a sugar that inhibits bacterial adherence to urothelial cells by binding to the pili of enteric bacteria). Take as per manufacturer recommendation. Can be used as an alternative or in addition to the concentrated cranberry supplement. Not recommended for diabetic patients due to sugar content. - Vitamin C supplement to acidify urine to minimize bacterial growth. Not recommended for patients with interstitial cystitis due to acidity. - Probiotic to maintain healthy vaginal microbiome to suppress bacteria at urethral opening. Brand recommendations: Uqora  (includes probiotic & D-mannose ), Feminine Balance (highest concentration of lactobacillus) or Hyperbiotic Pro 15.  Note for patients with diabetes: You may read about D-mannose powder for UTI prevention. That is an over the-counter supplement which decreases bacterial adherence to bladder lining. I would NOT advise that for you as a person with diabetes due to its sugar content.  

## 2023-07-16 ENCOUNTER — Telehealth: Payer: Self-pay

## 2023-07-16 NOTE — Progress Notes (Deleted)
Name: Alison Brewer DOB: 12-26-1934 MRN: [id number]  History of Present Illness: Alison Brewer is a 88 y.o. female who presents today for follow up visit at [facility] [place]. She is accompanied by ***her grandson Alison Brewer (who lives with her and is her medical guardian / caretaker) and assists with giving history due to patient's Alzheimer's dementia. - GU history: 1. Recurrent UTI.  At last visit on 07/02/2023: The plan was:  1. Adequate fluid intake (>1.5 liters/day) to flush out the urinary tract. 2. Urinate every 4-6 hours while awake to minimize urinary stasis/ bacterial overgrowth in the bladder. 3. Consider OTC supplements for UTI prevention. 4. Return in about 2 weeks (around 07/16/2023) for UA & pelvic exam for insertion of Estring.  Today: She reports ***  She {Actions; denies-reports:120008} increased urinary urgency, frequency, dysuria, gross hematuria, straining to void, or sensations of incomplete emptying.  She {Actions; denies-reports:120008} vaginal pain, bleeding, discharge. Denies being sexually active.   Fall Screening: Do you usually have a device to assist in your mobility? Yes - walker   Medications: Current Outpatient Medications  Medication Sig Dispense Refill   albuterol (VENTOLIN HFA) 108 (90 Base) MCG/ACT inhaler Inhale 1-2 puffs into the lungs every 6 (six) hours as needed for wheezing or shortness of breath. 18 g 0   alendronate (FOSAMAX) 70 MG tablet Take 1 tablet (70 mg total) by mouth every 7 (seven) days. Take with a full glass of water on an empty stomach. 4 tablet 11   aspirin EC 81 MG tablet Take 81 mg by mouth daily. Swallow whole.     Calcium Carb-Cholecalciferol (CALCIUM 600+D3 PO) Take 1 tablet by mouth 2 (two) times daily.     estradiol (ESTRING) 7.5 MCG/24HR vaginal ring Follow package directions. Medicated ring inserted vaginally. Exchange every 3 months (either at home or at a schedule office visit with provider). 1 each 3    memantine (NAMENDA) 10 MG tablet Take 1 tablet (10 mg total) by mouth 2 (two) times daily. 60 tablet 11   Multiple Vitamin (MULTIVITAMIN) tablet Take 1 tablet by mouth daily.     sodium chloride (MURO 128) 2 % ophthalmic solution Place 1 drop into both eyes daily.     No current facility-administered medications for this visit.    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   HTN (hypertension)    Hyperlipidemia    Mild cardiomegaly    Past Surgical History:  Procedure Laterality Date   BREAST BIOPSY Right 1990   neg   Family History  Problem Relation Age of Onset   Pancreatic cancer Mother    Coronary artery disease Father    Cancer Brother    Breast cancer Maternal Aunt    Social History   Socioeconomic History   Marital status: Widowed    Spouse name: Not on file   Number of children: Not on file   Years of education: 12   Highest education level: Not on file  Occupational History   Not on file  Tobacco Use   Smoking status: Former    Current packs/day: 0.00    Average packs/day: 1.5 packs/day for 18.0 years (27.0 ttl pk-yrs)    Types: Cigarettes    Start date: 2    Quit date: 33  Years since quitting: 44.6   Smokeless tobacco: Never  Vaping Use   Vaping status: Never Used  Substance and Sexual Activity   Alcohol use: Never   Drug use: Never   Sexual activity: Not Currently    Birth control/protection: None  Other Topics Concern   Not on file  Social History Narrative   Right handed   Drink no coffe   One story home   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   Social Determinants of Corporate investment banker Strain: Not on file  Food Insecurity: Not on file  Transportation Needs: Not on file  Physical Activity: Not on file  Stress:  Not on file  Social Connections: Not on file  Intimate Partner Violence: Not on file    Review of Systems Constitutional: Patient ***denies any unintentional weight loss or change in strength lntegumentary: Patient ***denies any rashes or pruritus Eyes: Patient denies ***dry eyes ENT: Patient ***denies dry mouth Cardiovascular: Patient ***denies chest pain or syncope Respiratory: Patient ***denies shortness of breath Gastrointestinal: Patient ***denies nausea, vomiting, constipation, or diarrhea Musculoskeletal: Patient ***denies muscle cramps or weakness Neurologic: Patient ***denies convulsions or seizures Psychiatric: Patient ***denies memory problems Allergic/Immunologic: Patient ***denies recent allergic reaction(s) Hematologic/Lymphatic: Patient denies bleeding tendencies Endocrine: Patient ***denies heat/cold intolerance  GU: As per HPI.  OBJECTIVE There were no vitals filed for this visit. There is no height or weight on file to calculate BMI.  Physical Examination Constitutional: ***No obvious distress; patient is ***non-toxic appearing  Cardiovascular: ***No visible lower extremity edema.  Respiratory: The patient does ***not have audible wheezing/stridor; respirations do ***not appear labored  Gastrointestinal: Abdomen ***non-distended Musculoskeletal: ***Normal ROM of UEs  Skin: ***No obvious rashes/open sores  Neurologic: CN 2-12 grossly ***intact Psychiatric: Answered questions ***appropriately with ***normal affect  Hematologic/Lymphatic/Immunologic: ***No obvious bruises or sites of spontaneous bleeding  UA: {Desc; negative/positive:13464} for *** WBC/hpf, *** RBC/hpf, bacteria (***) PVR: *** ml  ASSESSMENT Recurrent UTI  Vaginal atrophy ***  Will plan for follow up in *** months / ***1 year or sooner if needed. Pt verbalized understanding and agreement. All questions were answered.  PLAN Advised the following: 1. *** 2. ***No follow-ups on  file.  No orders of the defined types were placed in this encounter.   It has been explained that the patient is to follow regularly with their PCP in addition to all other providers involved in their care and to follow instructions provided by these respective offices. Patient advised to contact urology clinic if any urologic-pertaining questions, concerns, new symptoms or problems arise in the interim period.  There are no Patient Instructions on file for this visit.  Electronically signed by:  Donnita Falls, FNP   07/16/23    1:21 PM

## 2023-07-16 NOTE — Telephone Encounter (Signed)
Patient's grandson Ivin Booty  called in about Estring vaginal ring. Patient's grandson states that the vaginal ring is unaffordable and would like a prescription for the estradiol cream instead. Patient's grandson Ivin Booty is aware a message will be sent to Maralyn Sago on recommendation and someone will reach out with Maralyn Sago recommendation. Ivin Booty voiced understanding

## 2023-07-17 ENCOUNTER — Ambulatory Visit: Payer: PPO | Admitting: Urology

## 2023-07-17 ENCOUNTER — Other Ambulatory Visit: Payer: Self-pay | Admitting: Urology

## 2023-07-17 DIAGNOSIS — N390 Urinary tract infection, site not specified: Secondary | ICD-10-CM

## 2023-07-17 DIAGNOSIS — N952 Postmenopausal atrophic vaginitis: Secondary | ICD-10-CM

## 2023-07-17 MED ORDER — ESTRADIOL 0.1 MG/GM VA CREA: TOPICAL_CREAM | VAGINAL | 3 refills | Status: DC

## 2023-07-17 NOTE — Telephone Encounter (Signed)
Alison Brewer is made aware Per Maralyn Sago "Ok no problem. I will send that prescription today." Ivin Booty voiced understanding.

## 2023-08-10 NOTE — Progress Notes (Unsigned)
Name: Alison Brewer DOB: 04/09/1935 MRN: [id number]  History of Present Illness: Alison Brewer is a 88 y.o. female who presents today for follow up visit at [facility] [place]. She is accompanied by her grandson [name] (who lives with her and is her medical guardian / caretaker) and assists with giving history due to patient's Alzheimer's dementia. - GU history: 1. Recurrent UTI.  At initial visit on 07/02/2023: The plan was:  1. Adequate fluid intake (>1.5 liters/day) to flush out the urinary tract. 2. Urinate every 4-6 hours while awake to minimize urinary stasis/ bacterial overgrowth in the bladder. 3. Consider OTC supplements for UTI prevention. 4. Return in about 2 weeks (around 07/16/2023) for UA & pelvic exam for insertion of Estring.  Since last visit: Estring was cost-prohibitive so they elected to proceed with topical vaginal estrogen cream instead; order placed on 07/17/2023.  Today: She denies increased urinary urgency, frequency, nocturia, dysuria, gross hematuria, hesitancy, straining to void, or sensations of incomplete emptying.  Her grandson reports that it was too difficult for Alison Brewer to apply topical vaginal estrogen cream herself therefore they ultimately did elect to pay for the Estring, which they brought today for placement. She denies vaginal pain, bleeding, discharge.  Her grandson reports some concern about possible UTI at this time - states Alison Brewer has been a little more confused that usual over the past 2 days.   Fall Screening: Do you usually have a device to assist in your mobility? Yes   Medications: Current Outpatient Medications  Medication Sig Dispense Refill   ESTRING 7.5 MCG/24HR vaginal ring Place 1 each vaginally every 3 (three) months.     albuterol (VENTOLIN HFA) 108 (90 Base) MCG/ACT inhaler Inhale 1-2 puffs into the lungs every 6 (six) hours as needed for wheezing or shortness of breath. 18 g 0   alendronate (FOSAMAX) 70 MG  tablet Take 1 tablet (70 mg total) by mouth every 7 (seven) days. Take with a full glass of water on an empty stomach. 4 tablet 11   aspirin EC 81 MG tablet Take 81 mg by mouth daily. Swallow whole.     Calcium Carb-Cholecalciferol (CALCIUM 600+D3 PO) Take 1 tablet by mouth 2 (two) times daily.     memantine (NAMENDA) 10 MG tablet Take 1 tablet (10 mg total) by mouth 2 (two) times daily. 60 tablet 11   Multiple Vitamin (MULTIVITAMIN) tablet Take 1 tablet by mouth daily.     sodium chloride (MURO 128) 2 % ophthalmic solution Place 1 drop into both eyes daily.     No current facility-administered medications for this visit.    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   HTN (hypertension)    Hyperlipidemia    Mild cardiomegaly    Past Surgical History:  Procedure Laterality Date   BREAST BIOPSY Right 1990   neg   Family History  Problem Relation Age of Onset   Pancreatic cancer Mother    Coronary artery disease Father    Cancer Brother    Breast cancer Maternal Aunt    Social History   Socioeconomic History   Marital status: Widowed    Spouse name: Not on file   Number of children: Not on file   Years of education: 3  Highest education level: Not on file  Occupational History   Not on file  Tobacco Use   Smoking status: Former    Current packs/day: 0.00    Average packs/day: 1.5 packs/day for 18.0 years (27.0 ttl pk-yrs)    Types: Cigarettes    Start date: 10    Quit date: 43    Years since quitting: 44.6   Smokeless tobacco: Never  Vaping Use   Vaping status: Never Used  Substance and Sexual Activity   Alcohol use: Never   Drug use: Never   Sexual activity: Not Currently    Birth control/protection: None  Other Topics Concern   Not on file  Social History Narrative    Right handed   Drink no coffe   One story home   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  OBJECTIVE Vitals:   08/14/23 1405  BP: 134/67  Pulse: 71   There is no height or weight on file to calculate BMI.  Physical Examination Constitutional: No obvious distress; patient is non-toxic appearing  Cardiovascular: No visible lower extremity edema.  Respiratory: The patient does not have audible wheezing/stridor; respirations do not appear labored  Gastrointestinal: Abdomen non-distended Musculoskeletal: Normal ROM of UEs  Skin: No obvious rashes/open sores  Neurologic: CN 2-12 grossly intact Psychiatric: Answered questions appropriately with normal affect  Hematologic/Lymphatic/Immunologic: No obvious bruises or sites of spontaneous bleeding  Pelvic Exam: External genitalia  loss of labial architecture, narrowed vaginal introitus. No erythema, lesions or discharge.  Urethral meatus midline and negative  for discharge, polyps, prolapse, caruncles, masses, or erythema. Urethra negative  for tenderness, masses, hypermobility, or leak with cough.  Bladder negative  for tenderness or distention. Vagina negative  for discharge, irritation or lesions. Atrophic. No prolapse.   Pelvic floor is not hypertonic. Pelvic floor is not tender to palpation.  Pelvic exam was chaperoned by H. Cobb, LPN.   ASSESSMENT Recurrent UTI - Plan: Urinalysis, Routine w reflex microscopic, Urine Culture  Vaginal atrophy  We reviewed the rationale for topical vaginal estrogen replacement for UTI prevention. The risks, benefits, and alternatives to  Estring ring use were reviewed. We discussed the need for routine follow up every 3 months for Estring exchange.  The Estrace ring was lubricated and placed through the vaginal introitus without any difficulty; patient tolerated well with no report or evidence of discomfort. During valsalva and cough in the lithotomy position, the Estring ring was not expelled.   Will check urine culture and treat as indicated based on results based on grandson's report of increased confusion for patient over past 2 days.  Patient / grandson advised that if/when UTI-like symptoms occur they can call our office to speak with a nurse, who can place an order for urinalysis (with reflex to urine culture). They may then proceed to a [facility] to provide their urine sample. This is required for evaluation in order for their Urology provider to make informed treatment recommendation(s), which may or may not include an antibiotic prescription. They were given a "hat" and several empty specimen cups today for home  urine specimen collection.  Will plan for follow up in 3 months for Estring exchange and follow up. Patient / grandson verbalized understanding and agreement. All questions were answered.  PLAN Advised the following: 1. Urine culture. 2. Estring placed. 3. Adequate fluid intake (>1.5 liters/day) to flush out the urinary tract. 4. Urinate every 4-6 hours while awake to minimize urinary stasis/ bacterial overgrowth in the bladder. 5. Consider OTC supplements for UTI prevention. 6. Return for f/u with Evette Georges, NP (pelvic exam for Estring exchange).  Orders Placed This Encounter  Procedures   Urine Culture   Microscopic Examination   Urinalysis, Routine w reflex microscopic    It has been explained that the patient is to follow regularly with their PCP in addition to all other providers involved in their care and to follow instructions provided by these respective offices. Patient advised to  contact urology clinic if any urologic-pertaining questions, concerns, new symptoms or problems arise in the interim period.  Patient Instructions  Recommendations regarding UTI prevention / management:  When UTI symptoms occur: Call urology office to request order for urine culture. We recommend waiting for urine culture result prior to use of any antibiotics.  For bladder pain/ burning with urination: Over the counter Pyridium (phenazopyridine) as needed (commonly known under the "AZO" brand). No more than 3 days consecutively at a time due to risk for methemoglobinemia, liver function issues, and bone health damage with long term use of Pyridium.  Routine use for UTI prevention: - Topical vaginal estrogen via Estring vaginal ring for vaginal atrophy. Adequate fluid intake (>1.5 liters/day) to flush out the urinary tract. - Go to the bathroom to urinate every 4-6 hours while awake to minimize urinary stasis / bacterial overgrowth in the bladder. - Proanthocyanidin (PAC) supplement 36 mg daily; must be soluble (insoluble form of PAC will be ineffective). Recommended brand: Ellura. This is an over-the-counter supplement (often must be found/ purchased online) supplement derived from cranberries with concentrated active component: Proanthocyanidin (PAC) 36 mg daily. Decreases bacterial adherence to bladder lining. Not recommended for patients with interstitial cystitis due to acidity. - D-mannose powder (2 grams daily). This is an over-the-counter supplement which decreases bacterial adherence to bladder lining (it is a sugar that inhibits bacterial adherence to urothelial cells by binding to the pili of enteric bacteria). Take as per manufacturer recommendation. Can be used as an alternative or in addition to the concentrated cranberry supplement. Not recommended for diabetic patients due to sugar content. - Vitamin C supplement to acidify urine to minimize bacterial growth. Not recommended for  patients with interstitial cystitis due to acidity. - Probiotic to maintain healthy vaginal microbiome to suppress bacteria at urethral opening. Brand recommendations: Darrold Junker (includes probiotic & D-mannose ), Feminine Balance (highest concentration of lactobacillus) or Hyperbiotic Pro 15.  Total time spent caring for the patient today was over 30 minutes. This includes time spent on the date of the visit reviewing the patient's chart before the visit, time spent during the visit, and time spent after the visit on documentation. Over 50% of that time was spent in face-to-face time with this patient for direct counseling. E&M based on time and complexity of medical decision making.  Electronically signed by:  Donnita Falls, FNP   08/14/23    4:20 PM

## 2023-08-14 ENCOUNTER — Ambulatory Visit: Payer: PPO | Admitting: Urology

## 2023-08-14 ENCOUNTER — Encounter: Payer: Self-pay | Admitting: Urology

## 2023-08-14 VITALS — BP 134/67 | HR 71

## 2023-08-14 DIAGNOSIS — N390 Urinary tract infection, site not specified: Secondary | ICD-10-CM

## 2023-08-14 DIAGNOSIS — N952 Postmenopausal atrophic vaginitis: Secondary | ICD-10-CM

## 2023-08-14 DIAGNOSIS — R410 Disorientation, unspecified: Secondary | ICD-10-CM

## 2023-08-14 DIAGNOSIS — Z87440 Personal history of urinary (tract) infections: Secondary | ICD-10-CM

## 2023-08-14 LAB — URINALYSIS, ROUTINE W REFLEX MICROSCOPIC
Bilirubin, UA: NEGATIVE
Glucose, UA: NEGATIVE
Ketones, UA: NEGATIVE
Nitrite, UA: NEGATIVE
Protein,UA: NEGATIVE
Specific Gravity, UA: 1.02 (ref 1.005–1.030)
Urobilinogen, Ur: 0.2 mg/dL (ref 0.2–1.0)
pH, UA: 6 (ref 5.0–7.5)

## 2023-08-14 LAB — MICROSCOPIC EXAMINATION: RBC, Urine: >30 /hpf — AB (ref 0–2)

## 2023-08-14 NOTE — Patient Instructions (Signed)
Recommendations regarding UTI prevention / management:  When UTI symptoms occur: Call urology office to request order for urine culture. We recommend waiting for urine culture result prior to use of any antibiotics.  For bladder pain/ burning with urination: Over the counter Pyridium (phenazopyridine) as needed (commonly known under the "AZO" brand). No more than 3 days consecutively at a time due to risk for methemoglobinemia, liver function issues, and bone health damage with long term use of Pyridium.  Routine use for UTI prevention: - Topical vaginal estrogen via Estring vaginal ring for vaginal atrophy. Adequate fluid intake (>1.5 liters/day) to flush out the urinary tract. - Go to the bathroom to urinate every 4-6 hours while awake to minimize urinary stasis / bacterial overgrowth in the bladder. - Proanthocyanidin (PAC) supplement 36 mg daily; must be soluble (insoluble form of PAC will be ineffective). Recommended brand: Ellura. This is an over-the-counter supplement (often must be found/ purchased online) supplement derived from cranberries with concentrated active component: Proanthocyanidin (PAC) 36 mg daily. Decreases bacterial adherence to bladder lining. Not recommended for patients with interstitial cystitis due to acidity. - D-mannose powder (2 grams daily). This is an over-the-counter supplement which decreases bacterial adherence to bladder lining (it is a sugar that inhibits bacterial adherence to urothelial cells by binding to the pili of enteric bacteria). Take as per manufacturer recommendation. Can be used as an alternative or in addition to the concentrated cranberry supplement. Not recommended for diabetic patients due to sugar content. - Vitamin C supplement to acidify urine to minimize bacterial growth. Not recommended for patients with interstitial cystitis due to acidity. - Probiotic to maintain healthy vaginal microbiome to suppress bacteria at urethral opening. Brand  recommendations: Darrold Junker (includes probiotic & D-mannose ), Feminine Balance (highest concentration of lactobacillus) or Hyperbiotic Pro 15.

## 2023-08-19 LAB — URINE CULTURE

## 2023-08-21 ENCOUNTER — Telehealth: Payer: Self-pay

## 2023-08-21 ENCOUNTER — Other Ambulatory Visit: Payer: Self-pay | Admitting: Urology

## 2023-08-21 DIAGNOSIS — N390 Urinary tract infection, site not specified: Secondary | ICD-10-CM

## 2023-08-21 MED ORDER — SULFAMETHOXAZOLE-TRIMETHOPRIM 800-160 MG PO TABS: 1.0000 | ORAL_TABLET | Freq: Two times a day (BID) | ORAL | 0 refills | Status: DC

## 2023-08-21 NOTE — Telephone Encounter (Signed)
-----   Message from Donnita Falls sent at 08/21/2023 10:04 AM EDT ----- Please let pt know urine culture result. I have sent prescription for Bactrim. Thanks.

## 2023-08-21 NOTE — Telephone Encounter (Signed)
Tried calling patient with no answer, left vm for return call 

## 2023-08-24 ENCOUNTER — Ambulatory Visit (INDEPENDENT_AMBULATORY_CARE_PROVIDER_SITE_OTHER): Payer: PPO | Admitting: Family

## 2023-08-24 ENCOUNTER — Telehealth: Payer: Self-pay

## 2023-08-24 ENCOUNTER — Encounter: Payer: Self-pay | Admitting: Family

## 2023-08-24 VITALS — BP 130/80 | HR 66 | Temp 98.1°F | Resp 12 | Ht 64.0 in | Wt 129.0 lb

## 2023-08-24 DIAGNOSIS — B351 Tinea unguium: Secondary | ICD-10-CM

## 2023-08-24 MED ORDER — CICLOPIROX 8 % EX SOLN: Freq: Every day | CUTANEOUS | 0 refills | Status: DC

## 2023-08-24 NOTE — Progress Notes (Signed)
She denies a  Provider: Ellicia Alix FNP-C  Sharon Seller, NP  Patient Care Team: Sharon Seller, NP as PCP - General (Geriatric Medicine) Corky Crafts, MD as PCP - Cardiology (Cardiology)  Extended Emergency Contact Information Primary Emergency Contact: Eidson,joshua Home Phone: [phone] Mobile Phone: [phone] Relation: Grandson Secondary Emergency Contact: [place] Home Phone: [phone] Mobile Phone: [phone] Relation: Son  Code Status:  Full Code  Goals of care: Advanced Directive information    08/24/2023    2:28 PM  Advanced Directives  Does Patient Have a Medical Advance Directive? No     Chief Complaint  Patient presents with   Acute Visit    Possible foot fungus    HPI:  Pt is a 88 y.o. female seen today for an acute visit for evaluation of right toe fungus recently noted by family.she denies any pain or itching on toe.Her with Grandson who states just noticed that her toe was turning yellow in color.He request antifungal oral tablet medication.states he was also prescribed in the past and worked great. Discussed with Grandson use of oral antifungal medication need to monitor liver enzymes.discussed topical verse oral medication.States will try and remember to apply topical medication.    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   alendronate  (FOSAMAX) 70 MG tablet Take 1 tablet (70 mg total) by mouth every 7 (seven) days. Take with a full glass of water on an empty stomach.   aspirin EC 81 MG tablet Take 81 mg by mouth daily. Swallow whole.   Calcium Carb-Cholecalciferol (CALCIUM 600+D3 PO) Take 1 tablet by mouth 2 (two) times daily.   ESTRING 7.5 MCG/24HR vaginal ring Place 1 each vaginally every 3 (three) months.   memantine (NAMENDA) 10 MG tablet Take 1 tablet (10 mg total) by mouth 2 (two) times daily.   Multiple Vitamin (MULTIVITAMIN) tablet Take 1 tablet by mouth daily.   sodium chloride (MURO 128) 2 % ophthalmic solution Place 1 drop into both eyes daily.   [DISCONTINUED] sulfamethoxazole-trimethoprim (BACTRIM DS) 800-160 MG tablet Take 1 tablet by mouth every 12 (twelve) hours.   No facility-administered encounter medications on file as of 08/24/2023.    Review of Systems  Constitutional:  Negative for appetite change, chills, fatigue, fever and unexpected weight change.  Respiratory:  Negative for cough, chest tightness, shortness of breath and wheezing.   Cardiovascular:  Negative for chest pain, palpitations and leg swelling.  Musculoskeletal:  Negative for arthralgias, back pain, gait problem and joint swelling.  Skin:  Negative for color change, pallor, rash and wound.       Yellowing of toenail   Neurological:  Negative for dizziness, weakness, light-headedness, numbness and headaches.    Immunization History  Administered Date(s) Administered   Fluad Quad(high Dose 65+) 09/01/2022   Influenza Split 09/25/2014   Influenza-Unspecified 09/25/2014, 09/09/2015,  10/04/2017, 06/02/2019   PFIZER Comirnaty(Gray Top)Covid-19 Tri-Sucrose Vaccine 02/17/2020, 03/22/2020   Pneumococcal Conjugate-13 09/09/2015   Pneumococcal Polysaccharide-23 01/01/2017   Tdap 01/09/2023   Zoster Recombinant(Shingrix) 04/06/2020   Zoster, Live 05/26/2010   Pertinent  Health Maintenance Due  Topic Date Due   INFLUENZA VACCINE  07/19/2023    DEXA SCAN  Completed      04/09/2023    1:21 PM 06/01/2023    3:33 PM 06/04/2023    1:02 PM 06/22/2023   11:24 AM 08/24/2023    2:28 PM  Fall Risk  Falls in the past year? 0 1 1 1  0  Was there an injury with Fall? 0 1 0 1   Fall Risk Category Calculator 0 2 2 3    Patient at Risk for Falls Due to History of fall(s) History of fall(s);Impaired balance/gait;Impaired mobility No Fall Risks Impaired balance/gait   Fall risk Follow up Falls evaluation completed Falls evaluation completed;Education provided;Falls prevention discussed Falls evaluation completed Falls evaluation completed    Functional Status Survey:    Vitals:   08/24/23 1429  BP: 130/80  Pulse: 66  Resp: 12  Temp: 98.1 F (36.7 C)  SpO2: 99%  Weight: 129 lb (58.5 kg)  Height: 5\' 4"  (1.626 m)   Body mass index is 22.14 kg/m. Physical Exam Vitals reviewed.  Constitutional:      General: She is not in acute distress.    Appearance: Normal appearance. She is normal weight. She is not ill-appearing or diaphoretic.  HENT:     Head: Normocephalic.     Mouth/Throat:     Mouth: Mucous membranes are moist.     Pharynx: Oropharynx is clear. No oropharyngeal exudate or posterior oropharyngeal erythema.  Eyes:     General: No scleral icterus.       Right eye: No discharge.        Left eye: No discharge.     Conjunctiva/sclera: Conjunctivae normal.     Pupils: Pupils are equal, round, and reactive to light.  Cardiovascular:     Rate and Rhythm: Normal rate and regular rhythm.     Pulses: Normal pulses.     Heart sounds: Normal heart sounds. No murmur heard.    No friction rub. No gallop.  Pulmonary:     Effort: Pulmonary effort is normal. No respiratory distress.     Breath sounds: Normal breath sounds. No wheezing, rhonchi or rales.  Chest:     Chest wall: No tenderness.  Abdominal:     General: Bowel sounds are normal. There is no distension.     Palpations: Abdomen is soft. There is no mass.     Tenderness:  There is no abdominal tenderness. There is no right CVA tenderness, left CVA tenderness, guarding or rebound.  Feet:     Right foot:     Skin integrity: No skin breakdown, erythema or dry skin.     Toenail Condition: Fungal disease present.    Left foot:     Skin integrity: No skin breakdown, erythema, warmth or dry skin.     Toenail Condition: Fungal disease present. Skin:    General: Skin is warm and dry.     Coloration: Skin is not pale.     Findings: No bruising, erythema, lesion or rash.  Neurological:     Mental Status: She is alert. Mental status is at baseline.     Motor: No weakness.     Gait: Gait abnormal.  Psychiatric:  Mood and Affect: Mood normal.        Speech: Speech normal.        Behavior: Behavior normal.     Labs reviewed: Recent Labs    03/19/23 1559 04/09/23 1426 06/01/23 1556  NA 140 140 139  K 4.7 4.2 4.6  CL 103 104 104  CO2 31 30 29   GLUCOSE 86 65 89  BUN 37* 31* 30*  CREATININE 1.38* 1.29* 1.11*  CALCIUM 9.9 9.8 9.5   Recent Labs    01/09/23 1150 03/19/23 1559 04/09/23 1426  AST 26 24 24   ALT 20 17 20   ALKPHOS 71  --   --   BILITOT 0.8 0.5 0.5  PROT 6.7 6.2 6.3  ALBUMIN 4.2  --   --    Recent Labs    03/19/23 1559 04/09/23 1426 06/01/23 1556  WBC 6.0 5.5 4.9  NEUTROABS 3,618 2,943 2,234  HGB 10.9* 11.3* 10.8*  HCT 32.5* 34.1* 32.3*  MCV 93.4 94.5 94.7  PLT 180 183 178   Lab Results  Component Value Date   TSH 2.97 04/09/2023   No results found for: "HGBA1C" Lab Results  Component Value Date   CHOL 174 05/29/2022   HDL 82 05/29/2022   LDLCALC 70 05/29/2022   TRIG 135 05/29/2022   CHOLHDL 2.1 05/29/2022    Significant Diagnostic Results in last 30 days:  No results found.  Assessment/Plan  Onychomycosis - toes nail yellow in color but not Britel,thick or long  - discussed oral antifungal verse topical with patient's POA will avoid oral medication due to high advance age and high risk for liver  issues.will start on ciclopirox as below.  - ciclopirox (PENLAC) 8 % solution; Apply topically at bedtime. Apply over nail and surrounding skin. Apply daily over previous coat. After seven (7) days, may remove with alcohol and continue cycle.  Dispense: 6.6 mL; Refill: 0   Family/ staff Communication: Reviewed plan of care with patient and Grandson verbalized understanding   Labs/tests ordered: None   Next Appointment: Return if symptoms worsen or fail to improve.   Caesar Bookman, NP

## 2023-08-24 NOTE — Telephone Encounter (Signed)
Prior authorization request received from covermymeds for ciclopirox 8%solution. Priir authorization initiated through covermymeds and waiting on reply

## 2023-08-28 ENCOUNTER — Telehealth: Payer: Self-pay

## 2023-08-28 NOTE — Telephone Encounter (Signed)
Incoming fax received from patients pharmacy to initiate a PA for Ciclopirox 8 % Solution. I attempted to initiate a PA and the following message was received:      Please advise

## 2023-08-28 NOTE — Telephone Encounter (Signed)
Response sent to patient via mychart.

## 2023-08-28 NOTE — Telephone Encounter (Signed)
Might need to pay out of pocket if not covered by insurance.

## 2023-08-30 NOTE — Telephone Encounter (Signed)
I spoke with patients grandson to confirm that mychart message was received and to verify no questions, comments, or feedback. Ivin Booty is planning to pick up medication soon and pay out of pocket

## 2023-11-01 ENCOUNTER — Encounter: Payer: Self-pay | Admitting: Family

## 2023-11-01 ENCOUNTER — Ambulatory Visit (INDEPENDENT_AMBULATORY_CARE_PROVIDER_SITE_OTHER): Payer: PPO | Admitting: Family

## 2023-11-01 VITALS — BP 124/78 | HR 66 | Temp 97.4°F | Resp 18 | Ht 64.0 in | Wt 132.2 lb

## 2023-11-01 DIAGNOSIS — R310 Gross hematuria: Secondary | ICD-10-CM | POA: Diagnosis not present

## 2023-11-01 DIAGNOSIS — R32 Unspecified urinary incontinence: Secondary | ICD-10-CM

## 2023-11-01 LAB — POCT URINALYSIS DIPSTICK
Bilirubin, UA: NEGATIVE
Glucose, UA: NEGATIVE
Ketones, UA: NEGATIVE
Nitrite, UA: NEGATIVE
Protein, UA: NEGATIVE
Spec Grav, UA: 1.015 (ref 1.010–1.025)
Urobilinogen, UA: 0.2 U/dL
pH, UA: 6 (ref 5.0–8.0)

## 2023-11-01 MED ORDER — NITROFURANTOIN MONOHYD MACRO 100 MG PO CAPS
100.0000 mg | ORAL_CAPSULE | Freq: Two times a day (BID) | ORAL | 0 refills | Status: AC
Start: 2023-11-01 — End: 2023-11-08

## 2023-11-01 NOTE — Progress Notes (Signed)
Name: Alison Brewer DOB: Jun 01, 1935 MRN: [id number]  History of Present Illness: [name] is a 88 y.o. female who presents today for follow up visit at [facility] [place]. She is accompanied by her grandson Retail buyer (caretaker), who assists with providing history due to patient's dementia. - GU history: 1. Recurrent UTI.  2. Vaginal atrophy.  Urine culture results in past 12 months: - 04/09/2023: Positive for Klebsiella pneumoniae - 06/01/2023: Positive for Klebsiella pneumoniae - 08/14/2023: Positive for Klebsiella pneumoniae  At last visit on 08/14/2023: Advised the following: 1. Urine culture. 2. Estring placed. 3. Adequate fluid intake (>1.5 liters/day) to flush out the urinary tract. 4. Urinate every 4-6 hours while awake to minimize urinary stasis/ bacterial overgrowth in the bladder. 5. Consider OTC supplements for UTI prevention. 6. Return for f/u with Evette Georges, NP (pelvic exam for Estring exchange).  Since last visit: > 11/01/2023: Seen by PCP for UTI symptoms including increased confusion and behavioral changes. UA was grossly abnormal. Urine culture was positive for >100k E. Coli. She was prescribed Nitrofurantoin 100 mg 2x/day in 7 days.  Today: Her grandson reports that she is back to baseline today in terms of her cognition and behavior. Denies fevers or gross hematuria, or vomiting.   She denies vaginal pain, bleeding, discharge.   Fall Screening: Do you usually have a device to assist in your mobility? Yes - walker   Medications: Current Outpatient Medications  Medication Sig Dispense Refill   albuterol (VENTOLIN HFA) 108 (90 Base) MCG/ACT inhaler Inhale 1-2 puffs into the lungs every 6 (six) hours as needed for wheezing or shortness of breath. 18 g 0   alendronate (FOSAMAX) 70 MG tablet Take 1 tablet (70 mg total) by mouth every 7 (seven) days. Take with a full glass of water on an empty stomach. 4 tablet 11   aspirin EC 81 MG tablet Take 81  mg by mouth daily. Swallow whole.     Calcium Carb-Cholecalciferol (CALCIUM 600+D3 PO) Take 1 tablet by mouth 2 (two) times daily.     ciclopirox (PENLAC) 8 % solution Apply topically at bedtime. Apply over nail and surrounding skin. Apply daily over previous coat. After seven (7) days, may remove with alcohol and continue cycle. 6.6 mL 0   ESTRING 7.5 MCG/24HR vaginal ring Place 1 each vaginally every 3 (three) months.     memantine (NAMENDA) 10 MG tablet Take 1 tablet (10 mg total) by mouth 2 (two) times daily. 60 tablet 11   Multiple Vitamin (MULTIVITAMIN) tablet Take 1 tablet by mouth daily.     nitrofurantoin, macrocrystal-monohydrate, (MACROBID) 100 MG capsule Take 1 capsule (100 mg total) by mouth 2 (two) times daily for 7 days. 14 capsule 0   sodium chloride (MURO 128) 2 % ophthalmic solution Place 1 drop into both eyes daily.     No current facility-administered medications for this visit.    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   HTN (hypertension)    Hyperlipidemia    Mild cardiomegaly    Past Surgical History:  Procedure Laterality Date   BREAST BIOPSY Right 1990   neg   Family History  Problem Relation Age of Onset   Pancreatic cancer Mother    Coronary artery disease Father    Cancer  Brother    Breast cancer Maternal Aunt    Social History   Socioeconomic History   Marital status: Widowed    Spouse name: Not on file   Number of children: Not on file   Years of education: 12   Highest education level: Not on file  Occupational History   Not on file  Tobacco Use   Smoking status: Former    Current packs/day: 0.00    Average packs/day: 1.5 packs/day for 18.0 years (27.0 ttl pk-yrs)    Types: Cigarettes    Start date: 23    Quit date: 66    Years since  quitting: 44.9   Smokeless tobacco: Never  Vaping Use   Vaping status: Never Used  Substance and Sexual Activity   Alcohol use: Never   Drug use: Never   Sexual activity: Not Currently    Birth control/protection: None  Other Topics Concern   Not on file  Social History Narrative   Right handed   Drink no coffe   One story home   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  OBJECTIVE Vitals:   11/06/23 1455  BP: (!) 148/81  Pulse: 71  Temp: 97.9 F (36.6 C)   Body mass index is 21.8 kg/m.  Physical Examination Constitutional: No obvious distress; patient is non-toxic appearing  Cardiovascular: No visible lower extremity edema.  Respiratory: The patient does not have audible wheezing/stridor; respirations do not appear labored  Gastrointestinal: Abdomen non-distended Musculoskeletal: Normal ROM of UEs  Skin: No obvious rashes/open sores  Neurologic: CN 2-12 grossly intact Psychiatric: Answered questions appropriately with normal affect  Hematologic/Lymphatic/Immunologic: No obvious bruises or sites of spontaneous bleeding  PELVIC EXAM: The Estring was noted to be in place. It was removed and discarded. Narrow vaginal introitus with no bleeding or abnormal discharge. The Estring was replaced without any difficulty.  PVR: 114 ml  ASSESSMENT Recurrent UTI - Plan: BLADDER SCAN AMB NON-IMAGING, CANCELED: Urinalysis, Routine w reflex microscopic  Vaginal atrophy  We reviewed recent urine culture result; current antibiotic treatment is appropriate per culture sensitivity report. We reviewed that topical  vaginal estrogen via the Estring takes 12 weeks for full efficacy (around now) so hopefully she will have fewer UTIs moving forward from this point on. We reviewed observable UTI signs / symptoms such as fever, confusion, malaise, weakness, increased fatigue, acutely increased urinary urgency and/or frequency.  Will plan for follow up in 3 months for next Estring exchange or sooner if needed. Pt verbalized understanding and agreement. All questions were answered.  PLAN Advised the following: 1. Continue with Estring. 2. Return in about 3 months (around 02/06/2024) for f/u with NP for pelvic exam with Estring exchange (to be brought by patient).  Orders Placed This Encounter  Procedures   BLADDER SCAN AMB NON-IMAGING    It has been explained that the patient is to follow regularly with their PCP in addition to all other providers involved in their care and to follow instructions provided by these respective offices. Patient advised to contact urology clinic if any urologic-pertaining  questions, concerns, new symptoms or problems arise in the interim period.  There are no Patient Instructions on file for this visit.  Electronically signed by:  Donnita Falls, FNP   11/06/23    4:32 PM

## 2023-11-01 NOTE — Progress Notes (Signed)
Provider: Annalynne Ibanez FNP-C  Sharon Seller, NP  Patient Care Team: Sharon Seller, NP as PCP - General (Geriatric Medicine) Corky Crafts, MD as PCP - Cardiology (Cardiology)  Extended Emergency Contact Information Primary Emergency Contact: Dicenzo,joshua Home Phone: [phone] Mobile Phone: [phone] Relation: Grandson Secondary Emergency Contact: [place] Home Phone: [phone] Mobile Phone: [phone] Relation: Son  Code Status:  Full Code  Goals of care: Advanced Directive information    08/24/2023    2:28 PM  Advanced Directives  Does Patient Have a Medical Advance Directive? No     Chief Complaint  Patient presents with   Acute Visit    Possible UTI symptoms      HPI:  Pt is a 88 y.o. female seen today for an acute visit for evaluation of possible UTI symptoms.usually wears depends for emergency but in the past week has been incontinent for both bowel and bladder.she has not been able to do own ADL's.she is here with Grandson who provides HPI information.  She denies any fever,chills,nausea,vomiting,abdominal pain,flank pain,urgency,frequency,dysuria,difficult urination or hematuria.    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   alendronate (FOSAMAX) 70 MG tablet Take 1 tablet (70 mg total) by mouth every 7 (seven) days. Take with a full glass of water  on an empty stomach.   aspirin EC 81 MG tablet Take 81 mg by mouth daily. Swallow whole.   Calcium Carb-Cholecalciferol (CALCIUM 600+D3 PO) Take 1 tablet by mouth 2 (two) times daily.   ciclopirox (PENLAC) 8 % solution Apply topically at bedtime. Apply over nail and surrounding skin. Apply daily over previous coat. After seven (7) days, may remove with alcohol and continue cycle.   ESTRING 7.5 MCG/24HR vaginal ring Place 1 each vaginally every 3 (three) months.   memantine (NAMENDA) 10 MG tablet Take 1 tablet (10 mg total) by mouth 2 (two) times daily.   Multiple Vitamin (MULTIVITAMIN) tablet Take 1 tablet by mouth daily.   sodium chloride (MURO 128) 2 % ophthalmic solution Place 1 drop into both eyes daily.   No facility-administered encounter medications on file as of 11/01/2023.    Review of Systems  Constitutional:  Negative for appetite change, chills, fatigue, fever and unexpected weight change.  HENT:  Negative for congestion, dental problem, ear discharge, ear pain, facial swelling, hearing loss, nosebleeds, postnasal drip, rhinorrhea, sinus pressure, sinus pain, sneezing, sore throat, tinnitus and trouble swallowing.   Eyes:  Negative for pain, discharge, redness, itching and visual disturbance.  Respiratory:  Negative for cough, chest tightness, shortness of breath and wheezing.   Cardiovascular:  Negative for chest pain, palpitations and leg swelling.  Gastrointestinal:  Negative for abdominal distention, abdominal pain, blood in stool, constipation, diarrhea, nausea and vomiting.  Genitourinary:  Positive for frequency and hematuria. Negative for difficulty urinating, dysuria, flank pain and urgency.  Incontinent   Musculoskeletal:  Negative for arthralgias, back pain, gait problem, joint swelling, myalgias, neck pain and neck stiffness.  Skin:  Negative for color change, pallor and rash.  Psychiatric/Behavioral:  Negative for agitation, behavioral problems, confusion,  hallucinations and sleep disturbance. The patient is not nervous/anxious.     Immunization History  Administered Date(s) Administered   Fluad Quad(high Dose 65+) 09/01/2022   Influenza Split 09/25/2014   Influenza-Unspecified 09/25/2014, 09/09/2015, 10/04/2017, 06/02/2019   PFIZER Comirnaty(Gray Top)Covid-19 Tri-Sucrose Vaccine 02/17/2020, 03/22/2020   Pneumococcal Conjugate-13 09/09/2015   Pneumococcal Polysaccharide-23 01/01/2017   Tdap 01/09/2023   Zoster Recombinant(Shingrix) 04/06/2020   Zoster, Live 05/26/2010   Pertinent  Health Maintenance Due  Topic Date Due   INFLUENZA VACCINE  07/19/2023   DEXA SCAN  Completed      06/01/2023    3:33 PM 06/04/2023    1:02 PM 06/22/2023   11:24 AM 08/24/2023    2:28 PM 11/01/2023    2:48 PM  Fall Risk  Falls in the past year? 1 1 1  0 1  Was there an injury with Fall? 1 0 1  0  Fall Risk Category Calculator 2 2 3  2   Patient at Risk for Falls Due to History of fall(s);Impaired balance/gait;Impaired mobility No Fall Risks Impaired balance/gait  History of fall(s)  Fall risk Follow up Falls evaluation completed;Education provided;Falls prevention discussed Falls evaluation completed Falls evaluation completed  Falls evaluation completed   Functional Status Survey:    Vitals:   11/01/23 1451  BP: 124/78  Pulse: 66  Resp: 18  Temp: (!) 97.4 F (36.3 C)  SpO2: 99%  Weight: 132 lb 3.2 oz (60 kg)  Height: 5\' 4"  (1.626 m)   Body mass index is 22.69 kg/m. Physical Exam Vitals reviewed.  Constitutional:      General: She is not in acute distress.    Appearance: Normal appearance. She is normal weight. She is not ill-appearing or diaphoretic.  HENT:     Head: Normocephalic.     Mouth/Throat:     Mouth: Mucous membranes are moist.     Pharynx: Oropharynx is clear. No oropharyngeal exudate or posterior oropharyngeal erythema.  Eyes:     General: No scleral icterus.       Right eye: No discharge.        Left eye: No discharge.      Conjunctiva/sclera: Conjunctivae normal.     Pupils: Pupils are equal, round, and reactive to light.  Cardiovascular:     Rate and Rhythm: Normal rate and regular rhythm.     Pulses: Normal pulses.     Heart sounds: Normal heart sounds. No murmur heard.    No friction rub. No gallop.  Pulmonary:     Effort: Pulmonary effort is normal. No respiratory distress.     Breath sounds: Normal breath sounds. No wheezing, rhonchi or rales.  Chest:     Chest wall: No tenderness.  Abdominal:     General: Bowel sounds are normal. There is no distension.     Palpations: Abdomen is soft. There is no mass.     Tenderness: There is no abdominal tenderness. There is no right CVA tenderness, left CVA tenderness, guarding or rebound.  Musculoskeletal:        General: No swelling or tenderness. Normal range of motion.     Right lower leg: No edema.     Left lower leg: No edema.  Skin:    General: Skin is warm and dry.  Coloration: Skin is not pale.     Findings: No bruising, erythema, lesion or rash.  Neurological:     Mental Status: She is alert and oriented to person, place, and time.     Cranial Nerves: No cranial nerve deficit.     Sensory: No sensory deficit.     Motor: No weakness.     Coordination: Coordination normal.     Gait: Gait normal.  Psychiatric:        Mood and Affect: Mood normal.        Speech: Speech normal.        Behavior: Behavior normal.     Labs reviewed: Recent Labs    03/19/23 1559 04/09/23 1426 06/01/23 1556  NA 140 140 139  K 4.7 4.2 4.6  CL 103 104 104  CO2 31 30 29   GLUCOSE 86 65 89  BUN 37* 31* 30*  CREATININE 1.38* 1.29* 1.11*  CALCIUM 9.9 9.8 9.5   Recent Labs    01/09/23 1150 03/19/23 1559 04/09/23 1426  AST 26 24 24   ALT 20 17 20   ALKPHOS 71  --   --   BILITOT 0.8 0.5 0.5  PROT 6.7 6.2 6.3  ALBUMIN 4.2  --   --    Recent Labs    03/19/23 1559 04/09/23 1426 06/01/23 1556  WBC 6.0 5.5 4.9  NEUTROABS 3,618 2,943 2,234  HGB 10.9*  11.3* 10.8*  HCT 32.5* 34.1* 32.3*  MCV 93.4 94.5 94.7  PLT 180 183 178   Lab Results  Component Value Date   TSH 2.97 04/09/2023   No results found for: "HGBA1C" Lab Results  Component Value Date   CHOL 174 05/29/2022   HDL 82 05/29/2022   LDLCALC 70 05/29/2022   TRIG 135 05/29/2022   CHOLHDL 2.1 05/29/2022    Significant Diagnostic Results in last 30 days:  No results found.  Assessment/Plan 1. Gross hematuria Afebrile  - POC Urinalysis Dipstick yellow cloudy with trace leukocytes,positive for blood but negative nitrite. Will treat clinically and send urine for Cx - nitrofurantoin, macrocrystal-monohydrate, (MACROBID) 100 MG capsule; Take 1 capsule (100 mg total) by mouth 2 (two) times daily for 7 days.  Dispense: 14 capsule; Refill: 0 - Urine Culture  2. Urinary incontinence, unspecified type New possible due to her advance age/declining memory loss or could be an infection  Start on Nitrofurantoin as below  - nitrofurantoin, macrocrystal-monohydrate, (MACROBID) 100 MG capsule; Take 1 capsule (100 mg total) by mouth 2 (two) times daily for 7 days.  Dispense: 14 capsule; Refill: 0 - Urine Culture  Family/ staff Communication: Reviewed plan of care with patient verbalized understanding  Labs/tests ordered:  - POC Urinalysis Dipstick - Urine Culture  Next Appointment: Return if symptoms worsen or fail to improve.   Caesar Bookman, NP

## 2023-11-02 ENCOUNTER — Telehealth: Payer: Self-pay

## 2023-11-02 NOTE — Telephone Encounter (Signed)
Grandson called with no answer. Detailed message left.

## 2023-11-02 NOTE — Telephone Encounter (Signed)
-----   Message from Donnita Falls sent at 11/01/2023  3:24 PM EST ----- Patient is scheduled with me for possible UTI on Tuesday 11/19. Please advise her grandson (caretaker / POA) to bring her next Estring packet to that visit so we can exchange it while they're here that day so she doesn't have to come twice (has another appt just a week later on 11/26, which was previously scheduled for 3 month Estring replacement). Thanks

## 2023-11-03 LAB — URINE CULTURE
MICRO NUMBER:: 15732783
SPECIMEN QUALITY:: ADEQUATE

## 2023-11-06 ENCOUNTER — Ambulatory Visit: Payer: PPO | Admitting: Urology

## 2023-11-06 VITALS — BP 148/81 | HR 71 | Temp 97.9°F | Ht 65.0 in | Wt 131.0 lb

## 2023-11-06 DIAGNOSIS — N952 Postmenopausal atrophic vaginitis: Secondary | ICD-10-CM

## 2023-11-06 DIAGNOSIS — Z87440 Personal history of urinary (tract) infections: Secondary | ICD-10-CM

## 2023-11-06 DIAGNOSIS — N390 Urinary tract infection, site not specified: Secondary | ICD-10-CM

## 2023-11-06 NOTE — Progress Notes (Signed)
post void residual=141 

## 2023-11-13 ENCOUNTER — Ambulatory Visit: Payer: PPO | Admitting: Urology

## 2023-12-24 ENCOUNTER — Ambulatory Visit (INDEPENDENT_AMBULATORY_CARE_PROVIDER_SITE_OTHER): Payer: PPO | Admitting: Physician Assistant

## 2023-12-24 ENCOUNTER — Encounter: Payer: Self-pay | Admitting: Physician Assistant

## 2023-12-24 VITALS — BP 120/79 | HR 79 | Resp 20 | Ht 65.0 in | Wt 132.0 lb

## 2023-12-24 DIAGNOSIS — F0280 Dementia in other diseases classified elsewhere without behavioral disturbance: Secondary | ICD-10-CM

## 2023-12-24 DIAGNOSIS — G309 Alzheimer's disease, unspecified: Secondary | ICD-10-CM

## 2023-12-24 NOTE — Progress Notes (Signed)
 Assessment/Plan:   Dementia likely due to Alzheimer's disease  Alison Brewer is a very pleasant 89 y.o. [name] with a history of arthritis, hypertension, hyperlipidemia, HOH, and dementia likely due to Alzheimer's disease and vascular etiology seen today in follow up for memory loss. Patient is currently on memantine  10 mg bid. Progression of disease is noted, both STM and LTM. She needs more assistance with ADLS. She is less conversant than before and has more difficulty to ambulate, needs a walker.  Discussed 24/7 monitoring, memory care and [facility], grandson is to look into [facility], as he prefers to keep her at home. Mood is good. She has worsening insight into her condition.     Follow up in  6 months. Continue Memantine  10 mg twice daily. Side effects were discussed . May discontinue the medicine at some pont as it appears to be no longer therapeutic.  Recommend 24/7 monitoring Recommend good control of her cardiovascular risk factors Continue to control mood as per PCP   Subjective:    This patient is accompanied in the office by her grandson who supplements the history.  Previous records as well as any outside records available were reviewed prior to todays visit. Patient was last seen on 06/22/2023 with MMSE 17/30.   Any changes in memory since last visit?  Way worse, short term and long term is worse-grandson says. Patient has more difficulty remembering recent conversations and names of people, no longer writing, has difficulty with coloring, following instructions. Likes to go to [place] twice a week, not as often during the winter. LTM is worse than prior. She does not remember some memories of her grandson when he was younger.   repeats oneself?  Endorsed Disoriented when walking into a room?  She still recognizes her house.  Leaving objects?  May misplace things like clothes in the trash can, diapers in the drawer, needs more surveillance.   Wandering behavior?  Denies.   Any  personality changes since last visit?  Denies.   Any worsening depression?:  Denies.   Hallucinations or paranoia?  Denies.   Seizures? Denies.    Any sleep changes?  Denies vivid dreams, REM behavior or sleepwalking   Sleep apnea?   Denies.   Any hygiene concerns? Denies. Son makes sure that she showers daily. Independent of bathing and dressing?  Needs more assistance, family to hire [facility] soon to help her. Has difficulty knowing for ex, what the word shampoo is, so she says that, pointing at the object Does the patient needs help with medications? [place]  is in charge   Who is in charge of the finances? Guardian of State  is in charge     Any changes in appetite?  Denies. She enjoys eating Patient have trouble swallowing? Denies.   Does the patient cook? Yes, but not with the stove or microwave. She may be able to make a sandwich has been forgetting common recipes. She has forgotten how to make the biscuits.  Any headaches?   denies   Chronic back pain  denies   Ambulates with difficulty? She needs a walker for stability due to arthritis. During the past week she is forgetting how to standup    Recent falls or head injuries? denies     Unilateral weakness, numbness or tingling? denies   Any tremors?  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  Endorsed, continues to have recurrent UTIS. She may leave the Depends in  the laundry. Any bowel dysfunction? She has some constipation Patient lives with her grandson   Does the patient drive? 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     Initial visit 04/2022  How long did patient have memory difficulties? Every year is worse, but these may have been present for about 15 years, and  yet, over the last 10 years has been wears.  Changes have been progressive, but it was brought to her grandsons attention when she could not remember family members birthdays which was unusual for her.  Patient is in denial that there is anything wrong with her.   The person that noticed the biggest changes was her grandson who lives with her.  She is very concerned about numbers, she cannot put them together .  In addition, if the topic is changed, she may be stuck on the prior subject .  This has been noticed by myself during this visit as well.   She has had a recent UTI, and dehydration, in which she had acute mental status changes, she bounced back, except for the numbers . Patient lives with: Her grandson and his wife from [place], who moved to live with her about 3 years ago.  Her husband died in 12-11-15.   repeats oneself? She always did but is worse over the last 3 y. Disoriented when walking into a room?  Patient denies   Leaving objects in unusual places?  Patient denies.  She is very organized, and she puts the silverware back in the drawer according to her grandson.  However, she likes to hoard, does not like to throw things, although does not living in weird places.  Ambulates  with difficulty?   Patient denies. She walks more than before.  She lives in [place] house, and likes to walk around the house frequently.  Recent falls?  Patient denies   Any head injuries?  Patient denies   History of seizures?   Patient denies   Wandering behavior?  Patient denies   Patient drives?   Patient no longer drives she had a couple of episodes getting lost when going to the pizza place  Any mood changes such irritability agitation?  Patient denies   Any history of depression?:  Patient denies   Hallucinations?  Patient denies   Paranoia?  Patient denies   Patient reports that he sleeps well without vivid dreams, REM behavior or sleepwalking   History of sleep apnea?  Patient denies   Any  hygiene concerns?  Patient denies   Independent of bathing and dressing?  Endorsed  Does the patient needs help with medications?  Patient denies.  She never took many medications. Who is in charge of the finances?  Stepchildren  Patient have trouble swallowing? Patient denies   Does the patient cook? Her grandson does  Any kitchen accidents such as leaving the stove on? Patient denies   Any headaches?  Patient denies   The double vision? Patient denies   Any focal numbness or tingling?   I am not sure  Chronic back pain Patient denies   Unilateral weakness?  Patient denies   Any tremors?  Patient denies   Any history of anosmia? Decrease in the sense of smell , and decreased hearing  Any  incontinence of urine? Endorses incontinence  Patient had a recent E. Coli UTI  Any bowel dysfunction?   Patient occasionally uses prunes  History of heavy alcohol intake?  Patient denies   History of heavy tobacco use?  Patient denies   Family history of dementia?  father may have had, Alzheimer's disease    PREVIOUS MEDICATIONS:   CURRENT MEDICATIONS:  Outpatient Encounter Medications as of 12/24/2023  Medication Sig   albuterol  (VENTOLIN  HFA) 108 (90 Base) MCG/ACT inhaler Inhale 1-2 puffs into the lungs every 6 (six) hours as needed for wheezing or shortness of breath.   alendronate  (FOSAMAX ) 70 MG tablet Take 1 tablet (70 mg total) by mouth every 7 (seven) days. Take with a full glass of water on an empty stomach.   aspirin EC 81 MG tablet Take 81 mg by mouth daily. Swallow whole.   Calcium Carb-Cholecalciferol (CALCIUM 600+D3 PO) Take 1 tablet by mouth 2 (two) times daily.   ciclopirox  (PENLAC ) 8 % solution Apply topically at bedtime. Apply over nail and surrounding skin. Apply daily over previous coat. After seven (7) days, may remove with alcohol and continue cycle.   ESTRING  7.5 MCG/24HR vaginal ring Place 1 each vaginally every 3 (three) months.   memantine  (NAMENDA ) 10 MG tablet Take 1  tablet (10 mg total) by mouth 2 (two) times daily.   Multiple Vitamin (MULTIVITAMIN) tablet Take 1 tablet by mouth daily.   sodium chloride  (MURO 128) 2 % ophthalmic solution Place 1 drop into both eyes daily.   No facility-administered encounter medications on file as of 12/24/2023.       06/22/2023   12:00 PM  MMSE - Mini Mental State Exam  Orientation to time 0  Orientation to Place 5  Registration 3  Attention/ Calculation 0  Recall 2  Language- name 2 objects 2  Language- repeat 1  Language- follow 3 step command 3  Language- read & follow direction 1  Write a sentence 0  Copy design 0  Total score 17      05/05/2022   10:00 AM  Montreal Cognitive Assessment   Visuospatial/ Executive (0/5) 0  Naming (0/3) 3  Attention: Read list of digits (0/2) 2  Attention: Read list of letters (0/1) 1  Attention: Serial 7 subtraction starting at 100 (0/3) 0  Language: Repeat phrase (0/2) 1  Language : Fluency (0/1) 0  Abstraction (0/2) 0  Delayed Recall (0/5) 0  Orientation (0/6) 4  Total 11  Adjusted Score (based on education) 12    Objective:     PHYSICAL EXAMINATION:    VITALS:   Vitals:   12/24/23 1503  BP: 120/79  Pulse: 79  Resp: 20  SpO2: 98%  Weight: 132 lb (59.9 kg)  Height: 5' 5 (1.651 m)    GEN:  The patient appears stated age and is in NAD. HEENT:  Normocephalic, atraumatic.   Neurological examination:  General: NAD, well-groomed, appears stated age. Orientation: The patient is alert. Not oriented to person, place and date. She could not remember her grandson's name Cranial nerves: There is good facial symmetry. The speech is non fluent and clear. No aphasia or dysarthria. Fund of knowledge is appropriate. Recent and remote memory are impaired. Attention and concentration are reduced.  unable to name objects and repeat phrases.  Hearing is decreased to conversational tone.  Sensation: Sensation is intact to light touch throughout Motor: Strength is at  least antigravity x4. DTR's1/4 in UE/LE     Movement examination: Tone:  There is normal tone in the UE/LE Abnormal movements:  no tremor.  No myoclonus.  No asterixis.   Coordination:  There is some decremation with RAM's. Normal finger to nose  Gait and Station: The patient has difficulty arising out of a deep-seated chair without the use of the hands. Needs her walker to ambulate. The patient's stride length is difficult,short. she does not want to walk as she is afraid of falling.   Gait is cautious and narrow.    Thank you for allowing us  the opportunity to participate in the care of this nice patient. Please do not hesitate to contact us  for any questions or concerns.   Total time spent on today's visit was 30 minutes dedicated to this patient today, preparing to see patient, examining the patient, ordering tests and/or medications and counseling the patient, documenting clinical information in the EHR or other health record, independently interpreting results and communicating results to the patient/family, discussing treatment and goals, answering patient's questions and coordinating care.  Cc:  Caro Harlene POUR, NP  Camie Sevin 12/24/2023 3:31 PM

## 2023-12-24 NOTE — Patient Instructions (Signed)
 It was a pleasure to see you today at our office.   Recommendations:  Follow up in 6 months  Continue  Memantine  to 10 mg tablets  1 tablet twice daily.     Whom to call:  Memory  decline, memory medications: Call our office [phone]   For psychiatric meds, mood meds: Please have your primary care physician manage these medications.   Counseling regarding caregiver distress, including caregiver depression, anxiety and issues regarding community resources, adult day care programs, adult living facilities, or memory care questions:   Feel free to contact Misty Waddell Simmer, Social Worker at [phone]   For assessment of decision of mental capacity and competency:  Call Dr. Rosaline Nine, geriatric psychiatrist at [phone]  For guidance in geriatric dementia issues please call [facility] [phone]  For guidance regarding [facility] and if placement were needed at the facility, contact Nat Hock, Social Worker tel: [phone]  If you have any severe symptoms of a stroke, or other severe issues such as confusion,severe chills or fever, etc call 911 or go to the ER as you may need to be evaluated further   Feel free to visit Facebook page  [employer] for tips of how to care for people with memory problems.         RECOMMENDATIONS FOR ALL PATIENTS WITH MEMORY PROBLEMS: 1. Continue to exercise (Recommend 30 minutes of walking everyday, or 3 hours every week) 2. Increase social interactions - continue going to [place] and enjoy social gatherings with friends and family 3. Eat healthy, avoid fried foods and eat more fruits and vegetables 4. Maintain adequate blood pressure, blood sugar, and blood cholesterol level. Reducing the risk of stroke and cardiovascular disease also helps promoting better memory. 5. Avoid stressful situations. Live a simple life and avoid aggravations. Organize your time and prepare for the next day in  anticipation. 6. Sleep well, avoid any interruptions of sleep and avoid any distractions in the bedroom that may interfere with adequate sleep quality 7. Avoid sugar, avoid sweets as there is a strong link between excessive sugar intake, diabetes, and cognitive impairment We discussed the Mediterranean diet, which has been shown to help patients reduce the risk of progressive memory disorders and reduces cardiovascular risk. This includes eating fish, eat fruits and green leafy vegetables, nuts like almonds and hazelnuts, walnuts, and also use olive oil. Avoid fast foods and fried foods as much as possible. Avoid sweets and sugar as sugar use has been linked to worsening of memory function.  There is always a concern of gradual progression of memory problems. If this is the case, then we may need to adjust level of care according to patient needs. Support, both to the patient and caregiver, should then be put into place.    The Alzheimer's Association is here all day, every day for people facing Alzheimer's disease through our free 24/7 Helpline: 6318138265. The Helpline provides reliable information and support to all those who need assistance, such as individuals living with memory loss, Alzheimer's or other dementia, caregivers, health care professionals and the public.  Our highly trained and knowledgeable staff can help you with: Understanding memory loss, dementia and Alzheimer's  Medications and other treatment options  General information about aging and brain health  Skills to provide quality care and to find the best care from professionals  Legal, financial and living-arrangement decisions Our Helpline also features: Confidential care consultation provided by master's level clinicians who can help with decision-making  support, crisis assistance and education on issues families face every day  Help in a caller's preferred language using our translation service that features more than 200  languages and dialects  Referrals to local community programs, services and ongoing support     FALL PRECAUTIONS: Be cautious when walking. Scan the area for obstacles that may increase the risk of trips and falls. When getting up in the mornings, sit up at the edge of the bed for a few minutes before getting out of bed. Consider elevating the bed at the head end to avoid drop of blood pressure when getting up. Walk always in a well-lit room (use night lights in the walls). Avoid area rugs or power cords from appliances in the middle of the walkways. Use a walker or a cane if necessary and consider physical therapy for balance exercise. Get your eyesight checked regularly.  FINANCIAL OVERSIGHT: Supervision, especially oversight when making financial decisions or transactions is also recommended.  HOME SAFETY: Consider the safety of the kitchen when operating appliances like stoves, microwave oven, and blender. Consider having supervision and share cooking responsibilities until no longer able to participate in those. Accidents with firearms and other hazards in the house should be identified and addressed as well.   ABILITY TO BE LEFT ALONE: If patient is unable to contact 911 operator, consider using LifeLine, or when the need is there, arrange for someone to stay with patients. Smoking is a fire hazard, consider supervision or cessation. Risk of wandering should be assessed by caregiver and if detected at any point, supervision and safe proof recommendations should be instituted.  MEDICATION SUPERVISION: Inability to self-administer medication needs to be constantly addressed. Implement a mechanism to ensure safe administration of the medications.      Mediterranean Diet A Mediterranean diet refers to food and lifestyle choices that are based on the traditions of countries located on the [employer] [place]. This way of eating has been shown to help prevent certain conditions and improve  outcomes for people who have chronic diseases, like kidney disease and heart disease. What are tips for following this plan? Lifestyle  Cook and eat meals together with your family, when possible. Drink enough fluid to keep your urine clear or pale yellow. Be physically active every day. This includes: Aerobic exercise like running or swimming. Leisure activities like gardening, walking, or housework. Get 7-8 hours of sleep each night. If recommended by your health care provider, drink red wine in moderation. This means 1 glass a day for nonpregnant women and 2 glasses a day for men. A glass of wine equals 5 oz (150 mL). Reading food labels  Check the serving size of packaged foods. For foods such as rice and pasta, the serving size refers to the amount of cooked product, not dry. Check the total fat in packaged foods. Avoid foods that have saturated fat or trans fats. Check the ingredients list for added sugars, such as corn syrup. Shopping  At the grocery store, buy most of your food from the areas near the walls of the store. This includes: Fresh fruits and vegetables (produce). Grains, beans, nuts, and seeds. Some of these may be available in unpackaged forms or large amounts (in bulk). Fresh seafood. Poultry and eggs. Low-fat dairy products. Buy whole ingredients instead of prepackaged foods. Buy fresh fruits and vegetables in-season from local farmers markets. Buy frozen fruits and vegetables in resealable bags. If you do not have access to quality fresh seafood, buy precooked frozen shrimp  or canned fish, such as tuna, salmon, or sardines. Buy small amounts of raw or cooked vegetables, salads, or olives from the deli or salad bar at your store. Stock your pantry so you always have certain foods on hand, such as olive oil, canned tuna, canned tomatoes, rice, pasta, and beans. Cooking  Cook foods with extra-virgin olive oil instead of using butter or other vegetable oils. Have meat  as a side dish, and have vegetables or grains as your main dish. This means having meat in small portions or adding small amounts of meat to foods like pasta or stew. Use beans or vegetables instead of meat in common dishes like chili or lasagna. Experiment with different cooking methods. Try roasting or broiling vegetables instead of steaming or sauteing them. Add frozen vegetables to soups, stews, pasta, or rice. Add nuts or seeds for added healthy fat at each meal. You can add these to yogurt, salads, or vegetable dishes. Marinate fish or vegetables using olive oil, lemon juice, garlic, and fresh herbs. Meal planning  Plan to eat 1 vegetarian meal one day each week. Try to work up to 2 vegetarian meals, if possible. Eat seafood 2 or more times a week. Have healthy snacks readily available, such as: Vegetable sticks with hummus. Greek yogurt. Fruit and nut trail mix. Eat balanced meals throughout the week. This includes: Fruit: 2-3 servings a day Vegetables: 4-5 servings a day Low-fat dairy: 2 servings a day Fish, poultry, or lean meat: 1 serving a day Beans and legumes: 2 or more servings a week Nuts and seeds: 1-2 servings a day Whole grains: 6-8 servings a day Extra-virgin olive oil: 3-4 servings a day Limit red meat and sweets to only a few servings a month What are my food choices? Mediterranean diet Recommended Grains: Whole-grain pasta. Brown rice. Bulgar wheat. Polenta. Couscous. Whole-wheat bread. [place] Made[place]. Vegetables: Artichokes. Beets. Broccoli. Cabbage. Carrots. Eggplant. Green beans. Chard. Kale. Spinach. Onions. Leeks. Peas. Squash. Tomatoes. Peppers. Radishes. Fruits: Apples. Apricots. Avocado. Berries. Bananas. Cherries. Dates. Figs. Grapes. Lemons. Melon. Oranges. Peaches. Plums. Pomegranate. Meats and other protein foods: Beans. Almonds. Sunflower seeds. Pine nuts. Peanuts. Cod. Salmon. Scallops. Shrimp. Tuna. Tilapia. Clams. Oysters. Eggs. Dairy: Low-fat  milk. Cheese. Greek yogurt. Beverages: Water. Red wine. Herbal tea. Fats and oils: Extra virgin olive oil. Avocado oil. Grape seed oil. Sweets and desserts: Greek yogurt with honey. Baked apples. Poached pears. Trail mix. Seasoning and other foods: Basil. Cilantro. Coriander. Cumin. Mint. Parsley. Sage. Rosemary. Tarragon. Garlic. Oregano. Thyme. Pepper. Balsalmic vinegar. Tahini. Hummus. Tomato sauce. Olives. Mushrooms. Limit these Grains: Prepackaged pasta or rice dishes. Prepackaged cereal with added sugar. Vegetables: Deep fried potatoes (french fries). Fruits: Fruit canned in syrup. Meats and other protein foods: Beef. Pork. Lamb. Poultry with skin. Hot dogs. [place]. Dairy: Ice cream. Sour cream. Whole milk. Beverages: Juice. Sugar-sweetened soft drinks. Beer. Liquor and spirits. Fats and oils: Butter. Canola oil. Vegetable oil. Beef fat (tallow). Lard. Sweets and desserts: Cookies. Cakes. Pies. Candy. Seasoning and other foods: Mayonnaise. Premade sauces and marinades. The items listed may not be a complete list. Talk with your dietitian about what dietary choices are right for you. Summary The Mediterranean diet includes both food and lifestyle choices. Eat a variety of fresh fruits and vegetables, beans, nuts, seeds, and whole grains. Limit the amount of red meat and sweets that you eat. Talk with your health care provider about whether it is safe for you to drink red wine in moderation. This means 1 glass  a day for nonpregnant women and 2 glasses a day for men. A glass of wine equals 5 oz (150 mL). This information is not intended to replace advice given to you by your health care provider. Make sure you discuss any questions you have with your health care provider. Document Released: 07/27/2016 Document Revised: 08/29/2016 Document Reviewed: 07/27/2016 Elsevier Interactive Patient Education  2017 [employer].

## 2024-01-04 IMAGING — CR DG CHEST 1V PORT
2 series · 2 of 2 positions shown · non-contrast
Comparison: None.

CLINICAL DATA: Bilateral leg and ankle swelling, altered mental
status

EXAM:
PORTABLE CHEST 1 VIEW

[chest ap (1 of 2)]
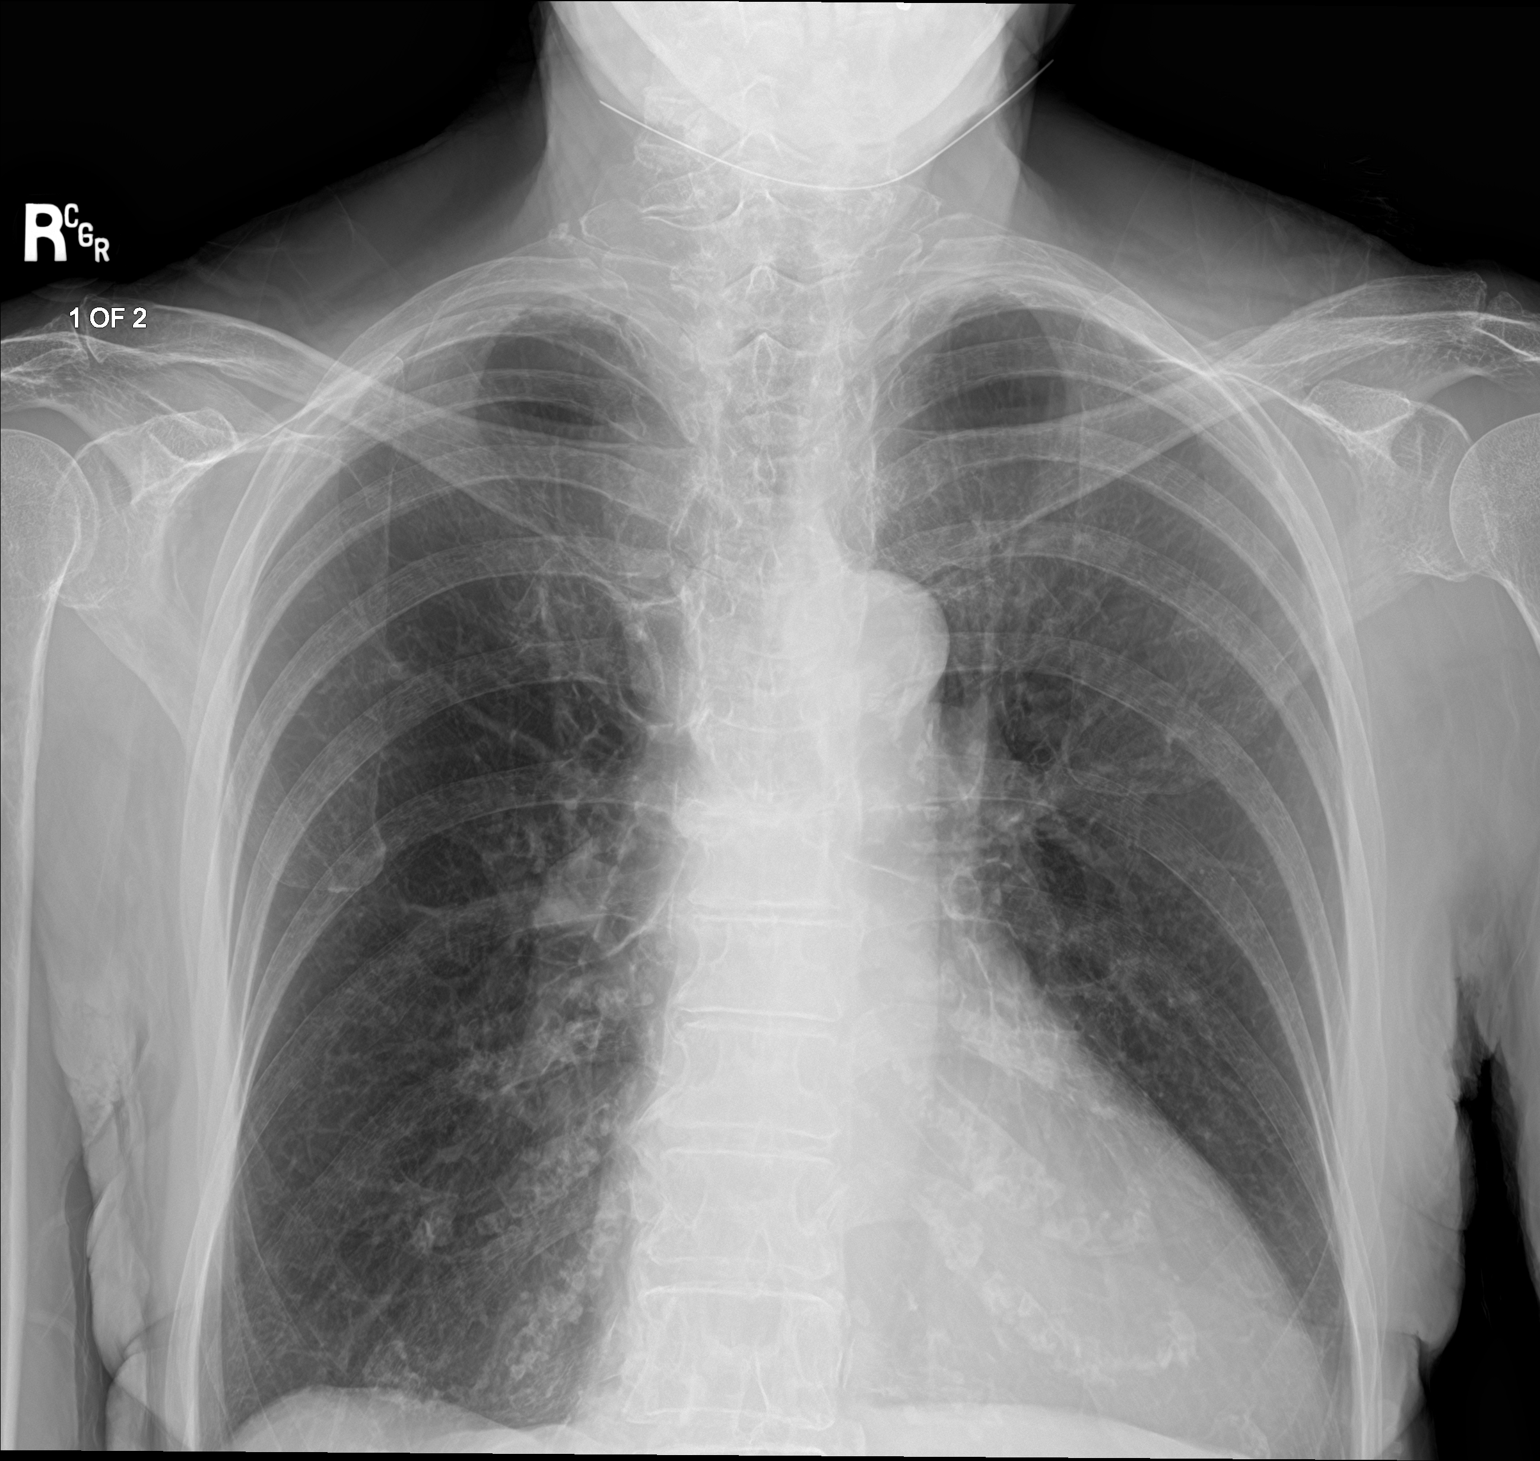

[chest ap (2 of 2)]
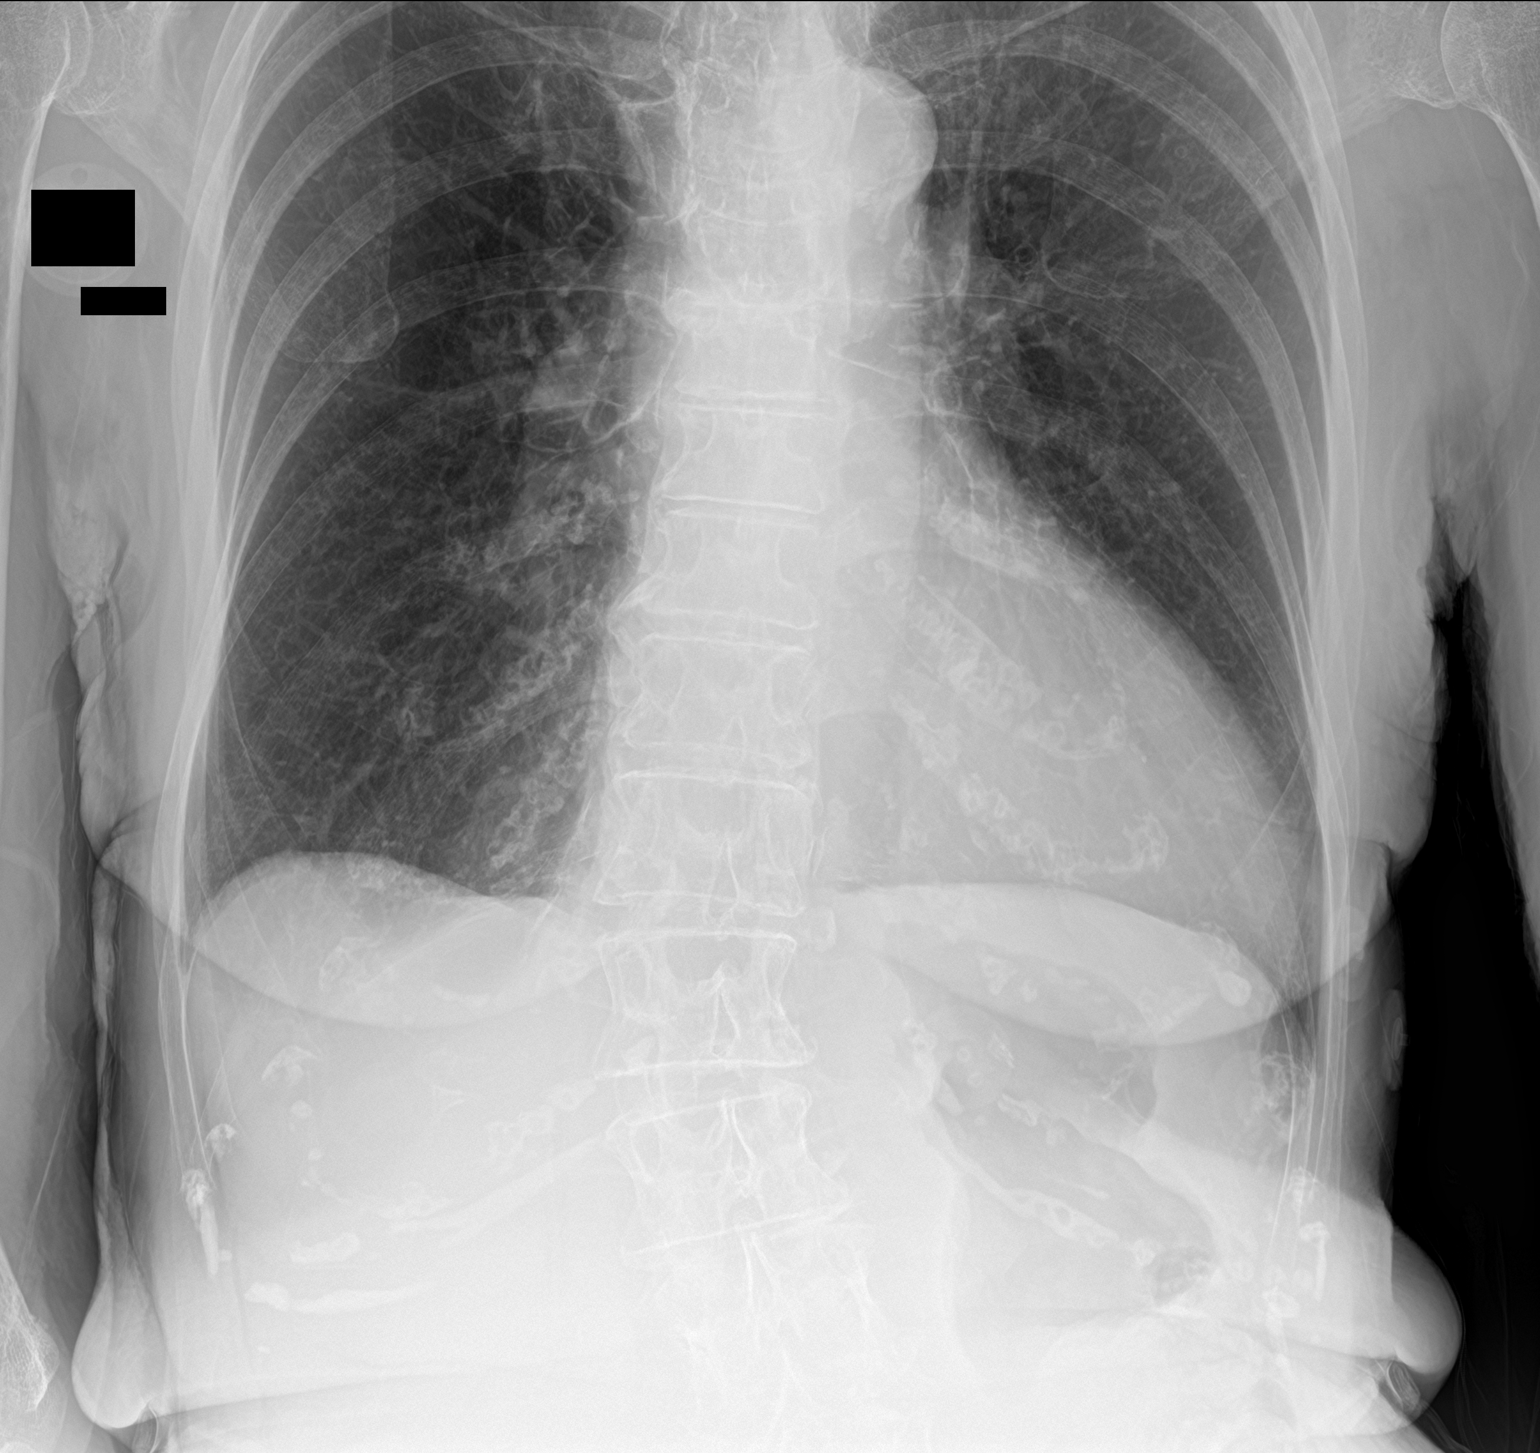

[2 of 2 positions shown; findings below may reference images not displayed]

FINDINGS: The heart is at the upper limits of normal for size. The upper
mediastinal contours are normal.

There is no focal consolidation or pulmonary edema. There is no
pleural effusion or pneumothorax.

There is no acute osseous abnormality.
IMPRESSION: Borderline cardiomegaly. Otherwise, no radiographic evidence of
acute cardiopulmonary process.

## 2024-01-07 ENCOUNTER — Ambulatory Visit (INDEPENDENT_AMBULATORY_CARE_PROVIDER_SITE_OTHER): Payer: PPO | Admitting: Nurse Practitioner

## 2024-01-07 ENCOUNTER — Encounter: Payer: Self-pay | Admitting: Nurse Practitioner

## 2024-01-07 ENCOUNTER — Encounter: Payer: Self-pay | Admitting: Dermatology

## 2024-01-07 ENCOUNTER — Ambulatory Visit (INDEPENDENT_AMBULATORY_CARE_PROVIDER_SITE_OTHER): Payer: PPO | Admitting: Dermatology

## 2024-01-07 VITALS — BP 132/80 | HR 69 | Temp 97.9°F | Ht 65.0 in | Wt 130.0 lb

## 2024-01-07 DIAGNOSIS — K5904 Chronic idiopathic constipation: Secondary | ICD-10-CM

## 2024-01-07 DIAGNOSIS — R5381 Other malaise: Secondary | ICD-10-CM

## 2024-01-07 DIAGNOSIS — D1801 Hemangioma of skin and subcutaneous tissue: Secondary | ICD-10-CM

## 2024-01-07 DIAGNOSIS — B351 Tinea unguium: Secondary | ICD-10-CM | POA: Diagnosis not present

## 2024-01-07 DIAGNOSIS — Z1283 Encounter for screening for malignant neoplasm of skin: Secondary | ICD-10-CM

## 2024-01-07 DIAGNOSIS — L578 Other skin changes due to chronic exposure to nonionizing radiation: Secondary | ICD-10-CM

## 2024-01-07 DIAGNOSIS — L820 Inflamed seborrheic keratosis: Secondary | ICD-10-CM | POA: Diagnosis not present

## 2024-01-07 DIAGNOSIS — F01B0 Vascular dementia, moderate, without behavioral disturbance, psychotic disturbance, mood disturbance, and anxiety: Secondary | ICD-10-CM

## 2024-01-07 DIAGNOSIS — N390 Urinary tract infection, site not specified: Secondary | ICD-10-CM

## 2024-01-07 DIAGNOSIS — L814 Other melanin hyperpigmentation: Secondary | ICD-10-CM

## 2024-01-07 DIAGNOSIS — D229 Melanocytic nevi, unspecified: Secondary | ICD-10-CM

## 2024-01-07 DIAGNOSIS — L300 Nummular dermatitis: Secondary | ICD-10-CM | POA: Diagnosis not present

## 2024-01-07 DIAGNOSIS — Z85828 Personal history of other malignant neoplasm of skin: Secondary | ICD-10-CM

## 2024-01-07 DIAGNOSIS — Z872 Personal history of diseases of the skin and subcutaneous tissue: Secondary | ICD-10-CM | POA: Diagnosis not present

## 2024-01-07 DIAGNOSIS — L821 Other seborrheic keratosis: Secondary | ICD-10-CM

## 2024-01-07 DIAGNOSIS — M810 Age-related osteoporosis without current pathological fracture: Secondary | ICD-10-CM

## 2024-01-07 DIAGNOSIS — W908XXA Exposure to other nonionizing radiation, initial encounter: Secondary | ICD-10-CM | POA: Diagnosis not present

## 2024-01-07 MED ORDER — JUBLIA 10 % EX SOLN: CUTANEOUS | 11 refills | Status: DC

## 2024-01-07 MED ORDER — TRIAMCINOLONE ACETONIDE 0.1 % EX CREA: TOPICAL_CREAM | CUTANEOUS | 2 refills | Status: DC

## 2024-01-07 NOTE — Patient Instructions (Addendum)
Dermatitis on Back:  Start Triamcinolone 0.1% cream twice daily up to 2 weeks at a time to back as needed for itching. Avoid applying to face, groin, and axilla. Use as directed. Long-term use can cause thinning of the skin.   Nails:   Start Jublia to right thumbnail and affected nails at bedtime.  Use nightly until nails are clear. Apply under nails as well. Keep nails trimmed.     Recommend daily broad spectrum sunscreen SPF 30+ to sun-exposed areas, reapply every 2 hours as needed. Call for new or changing lesions.  Staying in the shade or wearing long sleeves, sun glasses (UVA+UVB protection) and wide brim hats (4-inch brim around the entire circumference of the hat) are also recommended for sun protection.    Lesions on face: Seborrheic Keratosis  What causes seborrheic keratoses? Seborrheic keratoses are harmless, common skin growths that first appear during adult life.  As time goes by, more growths appear.  Some people may develop a large number of them.  Seborrheic keratoses appear on both covered and uncovered body parts.  They are not caused by sunlight.  The tendency to develop seborrheic keratoses can be inherited.  They vary in color from skin-colored to gray, brown, or even black.  They can be either smooth or have a rough, warty surface.   Seborrheic keratoses are superficial and look as if they were stuck on the skin.  Under the microscope this type of keratosis looks like layers upon layers of skin.  That is why at times the top layer may seem to fall off, but the rest of the growth remains and re-grows.    Treatment Seborrheic keratoses do not need to be treated, but can easily be removed in the office.  Seborrheic keratoses often cause symptoms when they rub on clothing or jewelry.  Lesions can be in the way of shaving.  If they become inflamed, they can cause itching, soreness, or burning.  Removal of a seborrheic keratosis can be accomplished by freezing, burning, or  surgery. If any spot bleeds, scabs, or grows rapidly, please return to have it checked, as these can be an indication of a skin cancer.    Gentle Skin Care Guide  1. Bathe no more than once a day.  2. Avoid bathing in hot water  3. Use a mild soap like Dove, Vanicream, Cetaphil, CeraVe. Can use Lever 2000 or Cetaphil antibacterial soap  4. Use soap only where you need it. On most days, use it under your arms, between your legs, and on your feet. Let the water rinse other areas unless visibly dirty.  5. When you get out of the bath/shower, use a towel to gently blot your skin dry, don't rub it.  6. While your skin is still a little damp, apply a moisturizing cream such as Vanicream, CeraVe, Cetaphil, Eucerin, Sarna lotion or plain Vaseline Jelly. For hands apply Neutrogena [place] Hand Cream or Excipial Hand Cream.  7. Reapply moisturizer any time you start to itch or feel dry.  8. Sometimes using free and clear laundry detergents can be helpful. Fabric softener sheets should be avoided. Downy Free & Gentle liquid, or any liquid fabric softener that is free of dyes and perfumes, it acceptable to use  9. If your doctor has given you prescription creams you may apply moisturizers over them      Melanoma ABCDEs  Melanoma is the most dangerous type of skin cancer, and is the leading cause of death from skin  disease.  You are more likely to develop melanoma if you: Have light-colored skin, light-colored eyes, or red or blond hair Spend a lot of time in the sun Tan regularly, either outdoors or in a tanning bed Have had blistering sunburns, especially during childhood Have a close family member who has had a melanoma Have atypical moles or large birthmarks  Early detection of melanoma is key since treatment is typically straightforward and cure rates are extremely high if we catch it early.   The first sign of melanoma is often a change in a mole or a new dark spot.  The ABCDE  system is a way of remembering the signs of melanoma.  A for asymmetry:  The two halves do not match. B for border:  The edges of the growth are irregular. C for color:  A mixture of colors are present instead of an even brown color. D for diameter:  Melanomas are usually (but not always) greater than 6mm - the size of a pencil eraser. E for evolution:  The spot keeps changing in size, shape, and color.  Please check your skin once per month between visits. You can use a small mirror in front and a large mirror behind you to keep an eye on the back side or your body.   If you see any new or changing lesions before your next follow-up, please call to schedule a visit.  Please continue daily skin protection including broad spectrum sunscreen SPF 30+ to sun-exposed areas, reapplying every 2 hours as needed when you're outdoors.   Staying in the shade or wearing long sleeves, sun glasses (UVA+UVB protection) and wide brim hats (4-inch brim around the entire circumference of the hat) are also recommended for sun protection.      Due to recent changes in healthcare laws, you may see results of your pathology and/or laboratory studies on MyChart before the doctors have had a chance to review them. We understand that in some cases there may be results that are confusing or concerning to you. Please understand that not all results are received at the same time and often the doctors may need to interpret multiple results in order to provide you with the best plan of care or course of treatment. Therefore, we ask that you please give [place] 2 business days to thoroughly review all your results before contacting the office for clarification. Should we see a critical lab result, you will be contacted sooner.   If You Need Anything After Your Visit  If you have any questions or concerns for your doctor, please call our main line at [phone] and press option 4 to reach your doctor's medical assistant. If no  one answers, please leave a voicemail as directed and we will return your call as soon as possible. Messages left after 4 pm will be answered the following business day.   You may also send [place] a message via MyChart. We typically respond to MyChart messages within 1-2 business days.  For prescription refills, please ask your pharmacy to contact our office. Our fax number is [phone].  If you have an urgent issue when the clinic is closed that cannot wait until the next business day, you can page your doctor at the number below.    Please note that while we do our best to be available for urgent issues outside of office hours, we are not available 24/7.   If you have an urgent issue and are unable to reach [place],  you may choose to seek medical care at your doctor's office, retail clinic, urgent care center, or emergency room.  If you have a medical emergency, please immediately call 911 or go to the emergency department.  Pager Numbers  - Dr. Gwen Pounds: [phone]  - Dr. Roseanne Reno: [phone]  - Dr. Katrinka Blazing: [phone]   In the event of inclement weather, please call our main line at [phone] for an update on the status of any delays or closures.  Dermatology Medication Tips: Please keep the boxes that topical medications come in in order to help keep track of the instructions about where and how to use these. Pharmacies typically print the medication instructions only on the boxes and not directly on the medication tubes.   If your medication is too expensive, please contact our office at [phone] option 4 or send [place] a message through MyChart.   We are unable to tell what your co-pay for medications will be in advance as this is different depending on your insurance coverage. However, we may be able to find a substitute medication at lower cost or fill out paperwork to get insurance to cover a needed medication.   If a prior authorization is required to get your medication  covered by your insurance company, please allow [place] 1-2 business days to complete this process.  Drug prices often vary depending on where the prescription is filled and some pharmacies may offer cheaper prices.  The website www.goodrx.com contains coupons for medications through different pharmacies. The prices here do not account for what the cost may be with help from insurance (it may be cheaper with your insurance), but the website can give you the price if you did not use any insurance.  - You can print the associated coupon and take it with your prescription to the pharmacy.  - You may also stop by our office during regular business hours and pick up a GoodRx coupon card.  - If you need your prescription sent electronically to a different pharmacy, notify our office through [facility] or by phone at [phone] option 4.     Si Usted Necesita Algo Despus de Su Visita  Tambin puede enviarnos un mensaje a travs de Clinical cytogeneticist. Por lo general respondemos a los mensajes de MyChart en el transcurso de 1 a 2 das hbiles.  Para renovar recetas, por favor pida a su farmacia que se ponga en contacto con nuestra oficina. Annie Sable de fax es [place] [phone].  Si tiene un asunto urgente cuando la clnica est cerrada y que no puede esperar hasta el siguiente da hbil, puede llamar/localizar a su doctor(a) al nmero que aparece a continuacin.   Por favor, tenga en cuenta que aunque hacemos todo lo posible para estar disponibles para asuntos urgentes fuera del horario de [place], no estamos disponibles las 24 horas del da, los 7 [address]  [address] de la [place].   Si tiene un problema urgente y no puede comunicarse con nosotros, puede optar por buscar atencin mdica  en el consultorio de su doctor(a), en una clnica privada, en un centro de atencin urgente o en una sala de emergencias.  Si tiene Engineer, drilling, por favor llame inmediatamente al 911 o vaya a la sala de  emergencias.  Nmeros de bper  - Dr. Gwen Pounds: [phone]  - Dra. Roseanne Reno: [phone]  - Dr. Katrinka Blazing: [phone]   En caso de inclemencias del tiempo, por favor llame a Lacy Duverney principal al [phone] para una actualizacin sobre el [place] de cualquier  retraso o cierre.  Consejos para la medicacin en dermatologa: Por favor, guarde las cajas en las que vienen los medicamentos de uso tpico para ayudarle a seguir las instrucciones sobre dnde y cmo usarlos. Las farmacias generalmente imprimen las instrucciones del medicamento slo en las cajas y no directamente en los tubos del [place].   Si su medicamento es muy caro, por favor, pngase en contacto con Rolm Gala llamando al [phone] y presione la opcin 4 o envenos un mensaje a travs de Clinical cytogeneticist.   No podemos decirle cul ser su copago por los medicamentos por adelantado ya que esto es diferente dependiendo de la cobertura de su seguro. Sin embargo, es posible que podamos encontrar un medicamento sustituto a Audiological scientist un formulario para que el seguro cubra el medicamento que se considera necesario.   Si se requiere una autorizacin previa para que su compaa de seguros [place] su medicamento, por favor permtanos de 1 a 2 das hbiles para completar [address]  Los precios de los medicamentos varan con frecuencia dependiendo del Environmental consultant de dnde se surte la receta y alguna farmacias pueden ofrecer precios ms baratos.  El sitio web www.goodrx.com tiene cupones para medicamentos de Health and safety inspector. Los precios aqu no tienen en cuenta lo que podra costar con la ayuda del seguro (puede ser ms barato con su seguro), pero el sitio web puede darle el precio si no utiliz Tourist information centre manager.  - Puede imprimir el cupn correspondiente y llevarlo con su receta a la farmacia.  - Tambin puede pasar por nuestra oficina durante el horario de atencin regular y Education officer, museum una tarjeta de cupones de GoodRx.  - Si  necesita que su receta se enve electrnicamente a una farmacia diferente, informe a nuestra oficina a travs de MyChart de [place] o por telfono llamando al [phone] y presione la opcin 4.

## 2024-01-07 NOTE — Patient Instructions (Addendum)
Miralax 17 gm take half dose-full dose daily Titrate to effect Start with half dose

## 2024-01-07 NOTE — Progress Notes (Signed)
Careteam: Patient Care Team: Sharon Seller, NP as PCP - General (Geriatric Medicine) Corky Crafts, MD as PCP - Cardiology (Cardiology)  PLACE OF SERVICE:  [facility]  Advanced Directive information    No Known Allergies  Chief Complaint  Patient presents with   Acute Visit    Dementia concerns. Request to check urine sample, discuss support going forward. Follow-up from neurology visit. Here with grandson, Ivin Booty.      HPI: Patient is a 89 y.o. female due to progression of dementia  Grandson reports she was not getting out of bed.  She was having issues with coordination.  Had to talk her through getting out of bed.  She has been having issues off and on.  Also notices issues with walking and using her walker  Has hard time getting up from a chair.  Does not place legs in the right position.  Not picking up her feet all the way She is cautious but grandson feels like its putting her more at risk   She has become more confused and doing things like throwing away clothes.  She has a person coming in to bath her, caring hands.  Lucila Maine is guardian of her.  The financial guardian has been notified that she needs changes to the home to maintain safety.  There is stairs to get in the house but it is becoming unsafe for her to go up and down them due to her mobility.   grandson reports ongoing constipation.  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 Her smoking use included cigarettes. She started smoking  about 63 years ago. She has a 27 pack-year smoking history. She has never used smokeless tobacco. She reports that she does not drink alcohol and does not use drugs.  Family History  Problem Relation Age of Onset   Pancreatic cancer Mother    Coronary artery disease Father    Cancer Brother    Breast cancer Maternal Aunt     Medications: Patient's Medications  New Prescriptions   No medications on file  Previous Medications   ALBUTEROL (VENTOLIN HFA) 108 (90 BASE) MCG/ACT INHALER    Inhale 1-2 puffs into the lungs every 6 (six) hours as needed for wheezing or shortness of breath.   ALENDRONATE (FOSAMAX) 70 MG TABLET    Take 1 tablet (70 mg total) by mouth every 7 (seven) days. Take with a full glass of water on an empty stomach.   ASPIRIN EC 81 MG TABLET    Take 81 mg by mouth daily. Swallow whole.   CALCIUM CARB-CHOLECALCIFEROL (CALCIUM 600+D3 PO)    Take 1 tablet by mouth 2 (two) times daily.   CICLOPIROX (PENLAC) 8 % SOLUTION    Apply topically at bedtime. Apply over nail and surrounding skin. Apply daily over previous coat. After seven (7) days, may remove with alcohol and continue cycle.   EFINACONAZOLE (JUBLIA) 10 % SOLN  Apply at bedtime to right thumbnail and affected toenails.   ESTRING 7.5 MCG/24HR VAGINAL RING    Place 1 each vaginally every 3 (three) months.   MEMANTINE (NAMENDA) 10 MG TABLET    Take 1 tablet (10 mg total) by mouth 2 (two) times daily.   MULTIPLE VITAMIN (MULTIVITAMIN) TABLET    Take 1 tablet by mouth daily.   SODIUM CHLORIDE (MURO 128) 2 % OPHTHALMIC SOLUTION    Place 1 drop into both eyes daily.   TRIAMCINOLONE CREAM (KENALOG) 0.1 %    Apply twice daily to lower back up to 2 weeks as needed for itching. Avoid applying to face, groin, and axilla.  Modified Medications   No medications on file  Discontinued Medications   No medications on file    Physical Exam:  Vitals:   01/07/24 0853  BP: 132/80  Pulse: 69  Temp: 97.9 F (36.6 C)  TempSrc:  Temporal  SpO2: 99%  Weight: 130 lb (59 kg)  Height: 5\' 5"  (1.651 m)   Body mass index is 21.63 kg/m. Wt Readings from Last 3 Encounters:  01/07/24 130 lb (59 kg)  12/24/23 132 lb (59.9 kg)  11/06/23 131 lb (59.4 kg)    Physical Exam Constitutional:      General: She is not in acute distress.    Appearance: She is well-developed. She is not diaphoretic.  HENT:     Head: Normocephalic and atraumatic.     Mouth/Throat:     Pharynx: No oropharyngeal exudate.  Eyes:     Conjunctiva/sclera: Conjunctivae normal.     Pupils: Pupils are equal, round, and reactive to light.  Cardiovascular:     Rate and Rhythm: Normal rate and regular rhythm.     Heart sounds: Normal heart sounds.  Pulmonary:     Effort: Pulmonary effort is normal.     Breath sounds: Normal breath sounds.  Abdominal:     General: Bowel sounds are normal.     Palpations: Abdomen is soft.  Musculoskeletal:     Cervical back: Normal range of motion and neck supple.     Right lower leg: No edema.     Left lower leg: No edema.  Skin:    General: Skin is warm and dry.  Neurological:     Mental Status: She is alert. Mental status is at baseline. She is disoriented.     Motor: Weakness present.     Gait: Gait abnormal.  Psychiatric:        Mood and Affect: Mood normal.     Labs reviewed: Basic Metabolic Panel: Recent Labs    03/19/23 1559 04/09/23 1426 06/01/23 1556  NA 140 140 139  K 4.7 4.2 4.6  CL 103 104 104  CO2 31 30 29   GLUCOSE 86 65 89  BUN 37* 31* 30*  CREATININE 1.38* 1.29* 1.11*  CALCIUM 9.9 9.8 9.5  TSH  --  2.97  --    Liver Function Tests: Recent Labs    01/09/23 1150 03/19/23 1559 04/09/23 1426  AST 26 24 24   ALT 20 17 20   ALKPHOS 71  --   --   BILITOT 0.8 0.5 0.5  PROT 6.7 6.2 6.3  ALBUMIN 4.2  --   --    No results for input(s): "LIPASE", "AMYLASE" in the last 8760 hours. No results for input(s): "AMMONIA" in the last 8760 hours. CBC: Recent Labs    03/19/23 1559  04/09/23 1426 06/01/23 1556  WBC 6.0 5.5 4.9  NEUTROABS  3,618 2,943 2,234  HGB 10.9* 11.3* 10.8*  HCT 32.5* 34.1* 32.3*  MCV 93.4 94.5 94.7  PLT 180 183 178   Lipid Panel: No results for input(s): "CHOL", "HDL", "LDLCALC", "TRIG", "CHOLHDL", "LDLDIRECT" in the last 8760 hours. TSH: Recent Labs    04/09/23 1426  TSH 2.97   A1C: No results found for: "HGBA1C"   Assessment/Plan 1. Moderate vascular dementia without behavioral disturbance, psychotic disturbance, mood disturbance, or anxiety (HCC) (Primary) Progressive decline noted. Needing more assistance and prompting at home  Continues with supportive care at home Followed by neurology - Ambulatory referral to Home Health - CBC with Differential/Platelet - Complete Metabolic Panel with eGFR  2. Debility Progressively worsening due to dementia - Ambulatory referral to Home Health - CBC with Differential/Platelet - Complete Metabolic Panel with eGFR  3. Recurrent UTI -encourage routine bathroom trips, management of constipation Proper hygiene. No signs of acute UTI at this time.   4. Age-related osteoporosis without current pathological fracture -continues on fosamax  - CBC with Differential/Platelet - Complete Metabolic Panel with eGFR  5. Chronic idiopathic constipation To use miralax 17gm daily, can decrease to every other day or use half dose if stools too loose.  Return in about 6 months (around 07/06/2024) for routine follow up .:   Dewane Timson K. Biagio Borg [facility] [phone]

## 2024-01-07 NOTE — Progress Notes (Signed)
Follow-Up Visit   Subjective  Alison Brewer is a 89 y.o. female who presents for the following: Skin Cancer Screening and Full Body Skin Exam. Hx of SCC. Check spot on face.  Check fingernails and toenails. Has used Cliclopirox topical.   The patient presents for Total-Body Skin Exam (TBSE) for skin cancer screening and mole check. The patient has spots, moles and lesions to be evaluated, some may be new or changing and the patient may have concern these could be cancer.  Elmarie Shiley, assisted with patient today but was not present in room during full body skin exam. Pt has Alzheimers  The following portions of the chart were reviewed this encounter and updated as appropriate: medications, allergies, medical history  Review of Systems:  No other skin or systemic complaints except as noted in HPI or Assessment and Plan.  Objective  Well appearing patient in no apparent distress; mood and affect are within normal limits.  A full examination was performed including scalp, head, eyes, ears, nose, lips, neck, chest, axillae, abdomen, back, buttocks, bilateral upper extremities, bilateral lower extremities, hands, feet, fingers, toes, fingernails, and toenails. All findings within normal limits unless otherwise noted below.   Relevant physical exam findings are noted in the Assessment and Plan.         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 EDC. 12/07/2010. - No evidence of recurrence today - Recommend regular full body skin exams - Recommend daily broad spectrum sunscreen SPF 30+ to sun-exposed areas, reapply every 2 hours as needed.  - Call if any new or changing lesions are noted between office visits    SKIN CANCER SCREENING PERFORMED TODAY.  ACTINIC DAMAGE - Chronic condition, secondary to cumulative UV/sun exposure - diffuse scaly erythematous macules with underlying dyspigmentation - Recommend daily broad spectrum  sunscreen SPF 30+ to sun-exposed areas, reapply every 2 hours as needed.  - Staying in the shade or wearing long sleeves, sun glasses (UVA+UVB protection) and wide brim hats (4-inch brim around the entire circumference of the hat) are also recommended for sun protection.  - Call for new or changing lesions.  LENTIGINES, SEBORRHEIC KERATOSES, HEMANGIOMAS - Benign normal skin lesions. Sks at Face. - Benign-appearing - Call for any changes  MELANOCYTIC NEVI - Tan-brown and/or pink-flesh-colored symmetric macules and papules - Benign appearing on exam today - Observation - Call clinic for new or changing moles - Recommend daily use of broad spectrum spf 30+ sunscreen to sun-exposed areas.    HISTORY OF PRECANCEROUS ACTINIC KERATOSIS - site(s) of PreCancerous Actinic Keratosis clear today. R malar cheek - these may recur and new lesions may form requiring treatment to prevent transformation into skin cancer - observe for new or changing spots and contact [facility] for appointment if occur - photoprotection with sun protective clothing; sunglasses and broad spectrum sunscreen with SPF of at least 30 + and frequent self skin exams recommended - yearly exams by a dermatologist recommended for persons with history of PreCancerous Actinic Keratoses     ONYCHOMYCOSIS Exam: Thickened toenails with distal onycholysis and subungal debris c/w onychomycosis, also R thumbnail, see photos  Chronic and persistent condition with duration or expected duration over one year. Condition is symptomatic/ bothersome to patient. Not currently at goal.  Pt has tried and failed Ciclopirox lacquer  Treatment Plan: Start Jublia solution to affected nails at bedtime.    Nummular Dermatitis Exam: Pink scaly papules with excoriations and  lower back  Chronic and persistent condition with duration or expected duration over one year. Condition is bothersome/symptomatic for patient. Currently flared.    Nummular dermatitis (eczema) is a chronic, relapsing, itchy rash that can significantly affect quality of life. It is often associated with dry skin and flares in the wintertime, and may require treatment with prescription topical anti-inflammatory medications, in addition to gentle skin care.  If there is associated atopic dermatitis and topicals are not working, then biologic injections may be necessary to clear rash and control symptoms.  Treatment Plan: Recommend mild soap and moisturizing cream 1-2 times daily.  Gentle skin care handout provided.    Start Triamcinolone 0.1% cream twice daily up to 2 weeks at a time to back as needed for itching. Avoid applying to face, groin, and axilla. Use as directed. Long-term use can cause thinning of the skin.   INFLAMED SEBORRHEIC KERATOSIS Exam: Erythematous keratotic or waxy stuck-on plaque L spinal lower back  Treatment:  Benign-appearing.  Call clinic for new or changing lesions.  Apply Triamcinolone cream to area twice daily when itching.  Patient deferred LN2 treatment at this time.     Return in about 1 year (around 01/06/2025) for TBSE, HxSCC.  I, Lawson Radar, CMA, am acting as scribe for Willeen Niece, MD.   Documentation: I have reviewed the above documentation for accuracy and completeness, and I agree with the above.  Willeen Niece, MD

## 2024-01-08 ENCOUNTER — Encounter: Payer: Self-pay | Admitting: Nurse Practitioner

## 2024-01-08 LAB — COMPLETE METABOLIC PANEL WITH GFR
AG Ratio: 1.7 (calc) (ref 1.0–2.5)
ALT: 11 U/L (ref 6–29)
AST: 19 U/L (ref 10–35)
Albumin: 4 g/dL (ref 3.6–5.1)
Alkaline phosphatase (APISO): 147 U/L (ref 37–153)
BUN/Creatinine Ratio: 26 (calc) — ABNORMAL HIGH (ref 6–22)
BUN: 28 mg/dL — ABNORMAL HIGH (ref 7–25)
CO2: 31 mmol/L (ref 20–32)
Calcium: 9.7 mg/dL (ref 8.6–10.4)
Chloride: 107 mmol/L (ref 98–110)
Creat: 1.06 mg/dL — ABNORMAL HIGH (ref 0.60–0.95)
Globulin: 2.3 g/dL (ref 1.9–3.7)
Glucose, Bld: 117 mg/dL (ref 65–139)
Potassium: 4.4 mmol/L (ref 3.5–5.3)
Sodium: 144 mmol/L (ref 135–146)
Total Bilirubin: 0.7 mg/dL (ref 0.2–1.2)
Total Protein: 6.3 g/dL (ref 6.1–8.1)
eGFR: 51 mL/min/{1.73_m2} — ABNORMAL LOW (ref >=60)

## 2024-01-08 LAB — CBC WITH DIFFERENTIAL/PLATELET
Absolute Lymphocytes: 2362 {cells}/uL (ref 850–3900)
Absolute Monocytes: 557 {cells}/uL (ref 200–950)
Basophils Absolute: 51 {cells}/uL (ref 0–200)
Basophils Relative: 0.8 %
Eosinophils Absolute: 122 {cells}/uL (ref 15–500)
Eosinophils Relative: 1.9 %
HCT: 34.3 % — ABNORMAL LOW (ref 35.0–45.0)
Hemoglobin: 11.5 g/dL — ABNORMAL LOW (ref 11.7–15.5)
MCH: 31 pg (ref 27.0–33.0)
MCHC: 33.5 g/dL (ref 32.0–36.0)
MCV: 92.5 fL (ref 80.0–100.0)
MPV: 10.6 fL (ref 7.5–12.5)
Monocytes Relative: 8.7 %
Neutro Abs: 3309 {cells}/uL (ref 1500–7800)
Neutrophils Relative %: 51.7 %
Platelets: 192 10*3/uL (ref 140–400)
RBC: 3.71 10*6/uL — ABNORMAL LOW (ref 3.80–5.10)
RDW: 12.3 % (ref 11.0–15.0)
Total Lymphocyte: 36.9 %
WBC: 6.4 10*3/uL (ref 3.8–10.8)

## 2024-01-09 ENCOUNTER — Other Ambulatory Visit: Payer: Self-pay | Admitting: Nurse Practitioner

## 2024-01-09 ENCOUNTER — Other Ambulatory Visit: Payer: Self-pay | Admitting: Dermatology

## 2024-01-09 DIAGNOSIS — B351 Tinea unguium: Secondary | ICD-10-CM

## 2024-01-09 DIAGNOSIS — M810 Age-related osteoporosis without current pathological fracture: Secondary | ICD-10-CM

## 2024-02-07 ENCOUNTER — Ambulatory Visit: Payer: PPO | Admitting: Urology

## 2024-02-23 NOTE — Progress Notes (Deleted)
 Name: KATINA REMICK DOB: 1935/04/28 MRN: [id number]  History of Present Illness: Ms. Polich is a 89 y.o. female who presents today for follow up visit at [facility]. She is accompanied by her ***grandson Retail buyer (caretaker), who assists with providing history due to patient's dementia. - GU history: 1. Recurrent UTI.  2. Vaginal atrophy.   Urine culture results in past 12 months: - 04/09/2023: Positive for Klebsiella pneumoniae - 06/01/2023: Positive for Klebsiella pneumoniae - 08/14/2023: Positive for Klebsiella pneumoniae - 11/01/2023: Positive for E. Coli  At last visit on 11/06/2023: Doing well. Estring exchanged.  ***here for Estring exchange  Today: She reports *** UTIs since last visit.   She {Actions; denies-reports:120008} vaginal pain, bleeding, abnormal discharge, itching, dryness.  She {Actions; denies-reports:120008} increased urinary urgency, frequency, nocturia, dysuria, gross hematuria, hesitancy, straining to void, or sensations of incomplete emptying.   Medications: Current Outpatient Medications  Medication Sig Dispense Refill   albuterol (VENTOLIN HFA) 108 (90 Base) MCG/ACT inhaler Inhale 1-2 puffs into the lungs every 6 (six) hours as needed for wheezing or shortness of breath. 18 g 0   alendronate (FOSAMAX) 70 MG tablet TAKE ONE TABLET BY MOUTH EVERY SEVEN DAYS WITH A FULL GLASS OF WATER ON AN EMPTY STOMACH 4 tablet 11   aspirin EC 81 MG tablet Take 81 mg by mouth daily. Swallow whole.     Calcium Carb-Cholecalciferol (CALCIUM 600+D3 PO) Take 1 tablet by mouth 2 (two) times daily.     ciclopirox (PENLAC) 8 % solution APPLY TOPICALLY AT BEDTIME. APPLY OVER NAIL AND SURROUNDING SKIN. APPLY DAILY OVER PREVIOUS COAT. AFTER SEVEN (7) DAYS, MAY REMOVE WITH ALCOHOL AND CONTINUE CYCLE. 6.6 mL 6   ESTRING 7.5 MCG/24HR vaginal ring Place 1 each vaginally every 3 (three) months.     memantine (NAMENDA) 10 MG tablet Take 1 tablet (10 mg total) by  mouth 2 (two) times daily. 60 tablet 11   Multiple Vitamin (MULTIVITAMIN) tablet Take 1 tablet by mouth daily.     sodium chloride (MURO 128) 2 % ophthalmic solution Place 1 drop into both eyes daily.     triamcinolone cream (KENALOG) 0.1 % Apply twice daily to lower back up to 2 weeks as needed for itching. Avoid applying to face, groin, and axilla. 80 g 2   No current facility-administered medications for this visit.    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   HTN (hypertension)    Hyperlipidemia    Mild cardiomegaly    Past Surgical History:  Procedure Laterality Date   BREAST BIOPSY Right 1990   neg   Family History  Problem Relation Age of Onset   Pancreatic cancer Mother    Coronary artery disease Father    Cancer Brother    Breast cancer Maternal Aunt    Social History   Socioeconomic History   Marital status: Widowed    Spouse name: Not on file   Number of children: Not on file   Years of education: 12   Highest education level: Not on file  Occupational History   Not on file  Tobacco Use   Smoking status: Former    Current packs/day: 0.00    Average packs/day: 1.5 packs/day for 18.0 years (27.0 ttl pk-yrs)    Types:  Cigarettes    Start date: 51    Quit date: 45    Years since quitting: 45.2   Smokeless tobacco: Never  Vaping Use   Vaping status: Never Used  Substance and Sexual Activity   Alcohol use: Never   Drug use: Never   Sexual activity: Not Currently    Birth control/protection: None  Other Topics Concern   Not on file  Social History Narrative   Right handed   Drink no coffe   One story home   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   Social Drivers of Corporate investment banker Strain: Not on file  Food Insecurity: Not  on file  Transportation Needs: Not on file  Physical Activity: Not on file  Stress: Not on file  Social Connections: Not on file  Intimate Partner Violence: Not on file    Review of Systems Constitutional: Patient denies any unintentional weight loss or change in strength lntegumentary: Patient denies any rashes or pruritus Cardiovascular: Patient denies chest pain or syncope Respiratory: Patient denies shortness of breath Gastrointestinal: ***Patient {Actions; denies-reports:120008} ***nausea, ***vomiting, ***constipation, ***diarrhea ***As per HPI Musculoskeletal: Patient denies muscle cramps or weakness Neurologic: Patient denies convulsions or seizures Allergic/Immunologic: Patient denies recent allergic reaction(s) Hematologic/Lymphatic: Patient denies bleeding tendencies Endocrine: Patient denies heat/cold intolerance  GU: As per HPI.  OBJECTIVE There were no vitals filed for this visit. There is no height or weight on file to calculate BMI.  Physical Examination Constitutional: No obvious distress; patient is non-toxic appearing  Cardiovascular: No visible lower extremity edema.  Respiratory: The patient does not have audible wheezing/stridor; respirations do not appear labored  Gastrointestinal: Abdomen non-distended Musculoskeletal: Normal ROM of UEs  Skin: No obvious rashes/open sores  Neurologic: CN 2-12 grossly intact Psychiatric: Answered questions appropriately with normal affect  Hematologic/Lymphatic/Immunologic: No obvious bruises or sites of spontaneous bleeding  UA:  ***positive for *** leukocytes, *** blood, ***nitrites ***Urine microscopy:  ***negative  *** WBC/hpf, *** RBC/hpf, *** bacteria ***with no evidence of UTI ***with no evidence of microscopic hematuria ***otherwise unremarkable ***glucosuria (secondary to ***Jardiance ***Farxiga use)  PVR: *** ml  ASSESSMENT No diagnosis found. ***  We agreed to plan for follow up in *** months /  ***1 year or sooner if needed. Patient verbalized understanding of and agreement with current plan. All questions were answered.  PLAN Advised the following: 1. *** 2. ***No follow-ups on file.  No orders of the defined types were placed in this encounter.   It has been explained that the patient is to follow regularly with their PCP in addition to all other providers involved in their care and to follow instructions provided by these respective offices. Patient advised to contact urology clinic if any urologic-pertaining questions, concerns, new symptoms or problems arise in the interim period.  There are no Patient Instructions on file for this visit.  Electronically signed by:  Donnita Falls, FNP   02/23/24    8:17 PM

## 2024-02-24 ENCOUNTER — Emergency Department (HOSPITAL_BASED_OUTPATIENT_CLINIC_OR_DEPARTMENT_OTHER)

## 2024-02-24 ENCOUNTER — Observation Stay (HOSPITAL_BASED_OUTPATIENT_CLINIC_OR_DEPARTMENT_OTHER)
Admission: EM | Admit: 2024-02-24 | Discharge: 2024-02-26 | Disposition: A | Discharge status: Home Health | Attending: Internal Medicine | Admitting: Internal Medicine

## 2024-02-24 ENCOUNTER — Other Ambulatory Visit: Payer: Self-pay

## 2024-02-24 DIAGNOSIS — I161 Hypertensive emergency: Secondary | ICD-10-CM | POA: Insufficient documentation

## 2024-02-24 DIAGNOSIS — Z7982 Long term (current) use of aspirin: Secondary | ICD-10-CM | POA: Insufficient documentation

## 2024-02-24 DIAGNOSIS — G309 Alzheimer's disease, unspecified: Secondary | ICD-10-CM | POA: Diagnosis not present

## 2024-02-24 DIAGNOSIS — Z87891 Personal history of nicotine dependence: Secondary | ICD-10-CM | POA: Diagnosis not present

## 2024-02-24 DIAGNOSIS — D500 Iron deficiency anemia secondary to blood loss (chronic): Secondary | ICD-10-CM | POA: Diagnosis not present

## 2024-02-24 DIAGNOSIS — R5381 Other malaise: Secondary | ICD-10-CM | POA: Diagnosis not present

## 2024-02-24 DIAGNOSIS — I6523 Occlusion and stenosis of bilateral carotid arteries: Secondary | ICD-10-CM | POA: Diagnosis not present

## 2024-02-24 DIAGNOSIS — E785 Hyperlipidemia, unspecified: Secondary | ICD-10-CM | POA: Insufficient documentation

## 2024-02-24 DIAGNOSIS — I2489 Other forms of acute ischemic heart disease: Secondary | ICD-10-CM | POA: Diagnosis not present

## 2024-02-24 DIAGNOSIS — R4182 Altered mental status, unspecified: Principal | ICD-10-CM | POA: Diagnosis present

## 2024-02-24 DIAGNOSIS — Z742 Need for assistance at home and no other household member able to render care: Secondary | ICD-10-CM | POA: Diagnosis not present

## 2024-02-24 DIAGNOSIS — R778 Other specified abnormalities of plasma proteins: Secondary | ICD-10-CM | POA: Diagnosis not present

## 2024-02-24 DIAGNOSIS — F0280 Dementia in other diseases classified elsewhere without behavioral disturbance: Secondary | ICD-10-CM | POA: Insufficient documentation

## 2024-02-24 DIAGNOSIS — Z85828 Personal history of other malignant neoplasm of skin: Secondary | ICD-10-CM | POA: Insufficient documentation

## 2024-02-24 DIAGNOSIS — I10 Essential (primary) hypertension: Secondary | ICD-10-CM | POA: Insufficient documentation

## 2024-02-24 DIAGNOSIS — Z79899 Other long term (current) drug therapy: Secondary | ICD-10-CM | POA: Diagnosis not present

## 2024-02-24 DIAGNOSIS — I169 Hypertensive crisis, unspecified: Principal | ICD-10-CM

## 2024-02-24 DIAGNOSIS — L89152 Pressure ulcer of sacral region, stage 2: Secondary | ICD-10-CM | POA: Insufficient documentation

## 2024-02-24 DIAGNOSIS — R0602 Shortness of breath: Secondary | ICD-10-CM | POA: Diagnosis not present

## 2024-02-24 DIAGNOSIS — R7989 Other specified abnormal findings of blood chemistry: Secondary | ICD-10-CM

## 2024-02-24 LAB — URINALYSIS, ROUTINE W REFLEX MICROSCOPIC
Bilirubin Urine: NEGATIVE
Glucose, UA: NEGATIVE mg/dL
Hgb urine dipstick: NEGATIVE
Ketones, ur: NEGATIVE mg/dL
Leukocytes,Ua: NEGATIVE
Nitrite: NEGATIVE
Protein, ur: NEGATIVE mg/dL
Specific Gravity, Urine: 1.018 (ref 1.005–1.030)
pH: 6.5 (ref 5.0–8.0)

## 2024-02-24 LAB — CBC
HCT: 33.8 % — ABNORMAL LOW (ref 36.0–46.0)
Hemoglobin: 11.3 g/dL — ABNORMAL LOW (ref 12.0–15.0)
MCH: 31.6 pg (ref 26.0–34.0)
MCHC: 33.4 g/dL (ref 30.0–36.0)
MCV: 94.4 fL (ref 80.0–100.0)
Platelets: 188 10*3/uL (ref 150–400)
RBC: 3.58 MIL/uL — ABNORMAL LOW (ref 3.87–5.11)
RDW: 13.9 % (ref 11.5–15.5)
WBC: 6.2 10*3/uL (ref 4.0–10.5)
nRBC: 0 % (ref 0.0–0.2)

## 2024-02-24 LAB — COMPREHENSIVE METABOLIC PANEL
ALT: 13 U/L (ref 0–44)
AST: 22 U/L (ref 15–41)
Albumin: 3.9 g/dL (ref 3.5–5.0)
Alkaline Phosphatase: 91 U/L (ref 38–126)
Anion gap: 8 (ref 5–15)
BUN: 24 mg/dL — ABNORMAL HIGH (ref 8–23)
CO2: 28 mmol/L (ref 22–32)
Calcium: 9 mg/dL (ref 8.9–10.3)
Chloride: 103 mmol/L (ref 98–111)
Creatinine, Ser: 1.07 mg/dL — ABNORMAL HIGH (ref 0.44–1.00)
GFR, Estimated: 51 mL/min — ABNORMAL LOW (ref >60)
Glucose, Bld: 87 mg/dL (ref 70–99)
Potassium: 4.2 mmol/L (ref 3.5–5.1)
Sodium: 139 mmol/L (ref 135–145)
Total Bilirubin: 0.8 mg/dL (ref 0.0–1.2)
Total Protein: 6 g/dL — ABNORMAL LOW (ref 6.5–8.1)

## 2024-02-24 LAB — TROPONIN I (HIGH SENSITIVITY)
Troponin I (High Sensitivity): 148 ng/L (ref <18)
Troponin I (High Sensitivity): 124 ng/L (ref <18)

## 2024-02-24 MED ORDER — AMLODIPINE BESYLATE 5 MG PO TABS
5.0000 mg | ORAL_TABLET | Freq: Once | ORAL | Status: AC
  Administered 2024-02-24: 5 mg via ORAL
  Filled 2024-02-24: qty 1

## 2024-02-24 MED ORDER — AMLODIPINE BESYLATE 5 MG PO TABS
5.0000 mg | ORAL_TABLET | Freq: Every day | ORAL | Status: DC
  Administered 2024-02-25 – 2024-02-26 (×2): 5 mg via ORAL
  Filled 2024-02-24 (×3): qty 1

## 2024-02-24 NOTE — ED Provider Notes (Signed)
 [place] EMERGENCY DEPARTMENT AT [facility] Provider Note   CSN: [id number] Arrival date & time: 02/24/24  1513     History  No chief complaint on file.   Alison Brewer is a 89 y.o. female with a past medical history significant for dementia due to Alzheimer's disease, hypertension, hyperlipidemia, arthritis, cardiomegaly who presents with concern for increased confusion from baseline, grandson reports that she has some dementia but normally can bathe herself without assistance, has had some increased incontinence, could not complete her normal bathing routine, she has had history of similar worsening dementia/confusion with urinary tract infection in the past and concern for same.  Patient with no complaints, denying any pain.  Alert and oriented to self but not time, place which is her baseline reportedly.  HPI     Home Medications Prior to Admission medications   Medication Sig Start Date End Date Taking? Authorizing Provider  albuterol (VENTOLIN HFA) 108 (90 Base) MCG/ACT inhaler Inhale 1-2 puffs into the lungs every 6 (six) hours as needed for wheezing or shortness of breath. 08/12/22   Particia Nearing, PA-C  alendronate (FOSAMAX) 70 MG tablet TAKE ONE TABLET BY MOUTH EVERY SEVEN DAYS WITH A FULL GLASS OF WATER ON AN EMPTY STOMACH 01/10/24   Sharon Seller, NP  aspirin EC 81 MG tablet Take 81 mg by mouth daily. Swallow whole.    [provider]  Calcium Carb-Cholecalciferol (CALCIUM 600+D3 PO) Take 1 tablet by mouth 2 (two) times daily.    [provider]  ciclopirox (PENLAC) 8 % solution APPLY TOPICALLY AT BEDTIME. APPLY OVER NAIL AND SURROUNDING SKIN. APPLY DAILY OVER PREVIOUS COAT. AFTER SEVEN (7) DAYS, MAY REMOVE WITH ALCOHOL AND CONTINUE CYCLE. 01/09/24   Willeen Niece, MD  ESTRING 7.5 MCG/24HR vaginal ring Place 1 each vaginally every 3 (three) months. 07/20/23   [provider]  memantine (NAMENDA) 10 MG tablet Take 1 tablet (10 mg  total) by mouth 2 (two) times daily. 06/22/23   Marcos Eke, PA-C  Multiple Vitamin (MULTIVITAMIN) tablet Take 1 tablet by mouth daily.    [provider]  sodium chloride (MURO 128) 2 % ophthalmic solution Place 1 drop into both eyes daily.    [provider]  triamcinolone cream (KENALOG) 0.1 % Apply twice daily to lower back up to 2 weeks as needed for itching. Avoid applying to face, groin, and axilla. 01/07/24   Willeen Niece, MD      Allergies    Patient has no known allergies.    Review of Systems   Review of Systems  All other systems reviewed and are negative.   Physical Exam Updated Vital Signs BP (!) 183/76   Pulse 67   Temp 98.7 F (37.1 C) (Oral)   Resp 14   SpO2 100%  Physical Exam Vitals and nursing note reviewed.  Constitutional:      General: She is not in acute distress.    Appearance: Normal appearance.  HENT:     Head: Normocephalic and atraumatic.  Eyes:     General:        Right eye: No discharge.        Left eye: No discharge.  Cardiovascular:     Rate and Rhythm: Normal rate and regular rhythm.     Heart sounds: No murmur heard.    No friction rub. No gallop.  Pulmonary:     Effort: Pulmonary effort is normal.     Breath sounds: Normal breath sounds.  Abdominal:     General: Bowel sounds are normal.     Palpations: Abdomen is soft.     Comments: No ttp of abdomen  Skin:    General: Skin is warm and dry.     Capillary Refill: Capillary refill takes less than 2 seconds.  Neurological:     Mental Status: She is alert. Mental status is at baseline.     Comments: Moves all 4 limbs spontaneously, CN II through XII grossly intact, can ambulate without difficulty, intact sensation throughout.  Psychiatric:        Mood and Affect: Mood normal.        Behavior: Behavior normal.     ED Results / Procedures / Treatments   Labs (all labs ordered are listed, but only abnormal results are displayed) Labs Reviewed  CBC - Abnormal;  Notable for the following components:      Result Value   RBC 3.58 (*)    Hemoglobin 11.3 (*)    HCT 33.8 (*)    All other components within normal limits  COMPREHENSIVE METABOLIC PANEL - Abnormal; Notable for the following components:   BUN 24 (*)    Creatinine, Ser 1.07 (*)    Total Protein 6.0 (*)    GFR, Estimated 51 (*)    All other components within normal limits  URINALYSIS, ROUTINE W REFLEX MICROSCOPIC  TROPONIN I (HIGH SENSITIVITY)    EKG EKG Interpretation Date/Time:  Sunday February 24 2024 17:31:45 EDT Ventricular Rate:  61 PR Interval:  176 QRS Duration:  108 QT Interval:  481 QTC Calculation: 485 R Axis:   -64  Text Interpretation: Sinus rhythm Probable left atrial enlargement LVH with secondary repolarization abnormality Inferior infarct, age indeterminate Anterior infarct, old Confirmed by Edwin Dada ([id number]) on 02/24/2024 6:33:36 PM  Radiology CT Head Wo Contrast Result Date: 02/24/2024 CLINICAL DATA:  Altered mental status. Personal history of dementia. EXAM: CT HEAD WITHOUT CONTRAST TECHNIQUE: Contiguous axial images were obtained from the base of the skull through the vertex without intravenous contrast. RADIATION DOSE REDUCTION: This exam was performed according to the departmental dose-optimization program which includes automated exposure control, adjustment of the mA and/or kV according to patient size and/or use of iterative reconstruction technique. COMPARISON:  CT head without contrast 01/09/2023. FINDINGS: Brain: Moderate atrophy and white matter changes are stable. No acute infarct, hemorrhage, or mass lesion is present. The ventricles are proportionate to the degree of atrophy. The callosal angle is 100 degrees, stable. Deep brain nuclei are within normal limits. No significant extraaxial fluid collection is present. The brainstem and cerebellum are within normal limits. Midline structures are within normal limits. Vascular: Atherosclerotic calcifications are  again noted within the cavernous internal carotid arteries bilaterally. No hyperdense vessel is present. Skull: Calvarium is intact. No focal lytic or blastic lesions are present. No significant extracranial soft tissue lesion is present. Sinuses/Orbits: Minimal fluid is present in the right sphenoid sinus. The paranasal sinuses and mastoid air cells are otherwise clear. The globes and orbits are within normal limits. IMPRESSION: 1. No acute intracranial abnormality or significant interval change. 2. Stable moderate atrophy and white matter disease. This likely reflects the sequela of chronic microvascular ischemia. 3. Minimal fluid in the right sphenoid sinus. Electronically Signed   By: Marin Roberts M.D.   On: 02/24/2024 18:08   DG Chest Portable 1 View Result Date: 02/24/2024 CLINICAL DATA:  Shortness of breath EXAM: PORTABLE CHEST 1 VIEW COMPARISON:  X-ray 08/07/2022. FINDINGS: Hyperinflation. Enlarged cardiopericardial silhouette  calcified aorta. No consolidation, pneumothorax or effusion. No edema. Chronic lung changes. Degenerative changes of the spine. Osteopenia. IMPRESSION: Hyperinflation with chronic changes. Electronically Signed   By: Karen Kays M.D.   On: 02/24/2024 17:21    Procedures Procedures    Medications Ordered in ED Medications - No data to display  ED Course/ Medical Decision Making/ A&P                                 Medical Decision Making Amount and/or Complexity of Data Reviewed Labs: ordered. Radiology: ordered.   This patient is a 89 y.o. female  who presents to the ED for concern of confusion, possible UTI.   Differential diagnoses prior to evaluation: The emergent differential diagnosis includes, but is not limited to,  CVA, seizure, hypotension, sepsis, hypoglycemia, hypoxic encephalopathy, metabolic encephalopathy, polypharmacy, substance abuse, developing dementia or alzheimers, meningitis, encephalitis, hypertensive emergency, other systemic  infection, acute alcohol intoxication, acute alcohol or other drug withdrawal or psychiatric manifestation vs other . This is not an exhaustive differential.   Past Medical History / Co-morbidities / Social History: dementia due to Alzheimer's disease, hypertension, hyperlipidemia, arthritis, cardiomegaly   Additional history: Chart reviewed. Pertinent results include: Reviewed outpatient telemedicine, neurology visit, lab work, imaging from previous emergency department visits.  Physical Exam: Physical exam performed. The pertinent findings include: Some confusion, difficulty following commands, no focal neurologic deficits, no tenderness to palpation of abdomen, stable vital signs.  She overall seems demented although slightly more confused to baseline per family. New hypertension on recheck.  Lab Tests/Imaging studies: I personally interpreted labs/imaging and the pertinent results include: CBC with mild anemia, hemoglobin 11.3 otherwise unremarkable, CMP notable for no significant abnormality, BUN, creatinine stable compared to baseline, UA unremarkable with no UTI.  I independently  interpreted plain film chest x-ray which shows no evidence of acute intrathoracic abnormality, CT head with no new intracranial changes other than some right sphenoid opacity concerning for possible sinusitis, may be chronic however with no fever, no drainage. I agree with the radiologist interpretation. Troponin pending at time of handoff.   Cardiac monitoring: EKG obtained and interpreted by myself and attending physician which shows: Normal sinus rhythm, no evidence of acute ST-T changes.   6:48 PM Care of Alison Brewer transferred to Dr. Wallace Cullens at the end of my shift as the patient will require reassessment once labs/imaging have resulted. Patient presentation, ED course, and plan of care discussed with review of all pertinent labs and imaging. Please see his/her note for further details regarding further ED  course and disposition. Plan at time of handoff is pending troponin, PVR, okay for discharge home with PCP, clinical condition consistent with worsening of baseline dementia . This may be altered or completely changed at the discretion of the oncoming team pending results of further workup.  Final Clinical Impression(s) / ED Diagnoses Final diagnoses:  None    Rx / DC Orders ED Discharge Orders     None         Olene Floss, PA-C 02/24/24 1848    Franne Forts, DO 02/25/24 0005

## 2024-02-24 NOTE — ED Provider Notes (Addendum)
 Patient is an 89 year old female presenting with her caregiver/grandson for worsening of her baseline dementia.  Her grandson states today she was walking around without her depends, saturating her pants and shoes with urine, refusing to take a shower, etc.  He states normally she goes to the bathroom on her own, wears a depends for chronic incontinence but otherwise is able to groom herself without difficulty and showers daily.  He states her behavior was significantly different than her baseline dementia.  On arrival patient denies any complaints or concerns.  She was found to be hypertensive with a blood pressure of 183/76 on multiple rechecks.  The patient does not have a history of hypertension and does not take blood pressure medications.  She is followed annually with her primary care physician.  She denies any chest pain or shortness of breath.  She has a stable EKG.  Troponin elevated at 148.  Patient was given Norvasc 5 mg IV for blood pressure control.  Repeat troponin 124.  Creatinine 1.07 which appears to be patient's baseline.  Recommending admission for hypertensive crisis with an elevated troponin that may be resulting in patient's changes from baseline.  Outside of that there is no signs or symptoms of sepsis.  No leukocytosis.  UA demonstrates no urinary tract infection.  CT head stable.  Patient accepted by admitting team with Dr. Haroldine Laws.   Franne Forts, DO 02/24/24 2119    Franne Forts, DO 02/24/24 2254

## 2024-02-24 NOTE — ED Triage Notes (Signed)
 Dementia at baseline. Increased confusion over past week. Weak on feet. Incontinent of bladder and couldn't take a shower. Family concerned for possible UTI.

## 2024-02-24 NOTE — ED Notes (Signed)
 Dr. Leanna Sato aware of troponin of 124

## 2024-02-24 NOTE — ED Notes (Signed)
 Dr. Leanna Sato aware of troponin of 148.

## 2024-02-25 DIAGNOSIS — R778 Other specified abnormalities of plasma proteins: Secondary | ICD-10-CM | POA: Diagnosis not present

## 2024-02-25 DIAGNOSIS — Z85828 Personal history of other malignant neoplasm of skin: Secondary | ICD-10-CM | POA: Diagnosis not present

## 2024-02-25 DIAGNOSIS — R4182 Altered mental status, unspecified: Secondary | ICD-10-CM | POA: Diagnosis not present

## 2024-02-25 DIAGNOSIS — F02C3 Dementia in other diseases classified elsewhere, severe, with mood disturbance: Secondary | ICD-10-CM | POA: Diagnosis not present

## 2024-02-25 DIAGNOSIS — L89152 Pressure ulcer of sacral region, stage 2: Secondary | ICD-10-CM | POA: Diagnosis not present

## 2024-02-25 DIAGNOSIS — G309 Alzheimer's disease, unspecified: Secondary | ICD-10-CM | POA: Diagnosis not present

## 2024-02-25 DIAGNOSIS — Z7982 Long term (current) use of aspirin: Secondary | ICD-10-CM | POA: Diagnosis not present

## 2024-02-25 DIAGNOSIS — I161 Hypertensive emergency: Secondary | ICD-10-CM | POA: Diagnosis not present

## 2024-02-25 DIAGNOSIS — I10 Essential (primary) hypertension: Secondary | ICD-10-CM | POA: Diagnosis not present

## 2024-02-25 DIAGNOSIS — R5381 Other malaise: Secondary | ICD-10-CM | POA: Diagnosis not present

## 2024-02-25 DIAGNOSIS — Z87891 Personal history of nicotine dependence: Secondary | ICD-10-CM | POA: Diagnosis not present

## 2024-02-25 DIAGNOSIS — I2489 Other forms of acute ischemic heart disease: Secondary | ICD-10-CM | POA: Diagnosis not present

## 2024-02-25 DIAGNOSIS — Z79899 Other long term (current) drug therapy: Secondary | ICD-10-CM | POA: Diagnosis not present

## 2024-02-25 DIAGNOSIS — D500 Iron deficiency anemia secondary to blood loss (chronic): Secondary | ICD-10-CM | POA: Diagnosis not present

## 2024-02-25 DIAGNOSIS — Z742 Need for assistance at home and no other household member able to render care: Secondary | ICD-10-CM | POA: Diagnosis not present

## 2024-02-25 DIAGNOSIS — E785 Hyperlipidemia, unspecified: Secondary | ICD-10-CM | POA: Diagnosis not present

## 2024-02-25 DIAGNOSIS — F0280 Dementia in other diseases classified elsewhere without behavioral disturbance: Secondary | ICD-10-CM | POA: Diagnosis not present

## 2024-02-25 LAB — VITAMIN B12: Vitamin B-12: 734 pg/mL (ref 180–914)

## 2024-02-25 LAB — MRSA NEXT GEN BY PCR, NASAL: MRSA by PCR Next Gen: NOT DETECTED

## 2024-02-25 LAB — TSH: TSH: 2.871 u[IU]/mL (ref 0.350–4.500)

## 2024-02-25 LAB — FOLATE: Folate: 18.1 ng/mL (ref >5.9)

## 2024-02-25 MED ORDER — ENOXAPARIN SODIUM 40 MG/0.4ML IJ SOSY
40.0000 mg | PREFILLED_SYRINGE | INTRAMUSCULAR | Status: DC
  Administered 2024-02-25: 40 mg via SUBCUTANEOUS
  Filled 2024-02-25: qty 0.4

## 2024-02-25 MED ORDER — ACETAMINOPHEN 650 MG RE SUPP: 650.0000 mg | Freq: Four times a day (QID) | RECTAL | Status: DC | PRN

## 2024-02-25 MED ORDER — ACETAMINOPHEN 325 MG PO TABS: 650.0000 mg | ORAL_TABLET | Freq: Four times a day (QID) | ORAL | Status: DC | PRN

## 2024-02-25 NOTE — ED Notes (Signed)
 Pt given microwave meal and water

## 2024-02-25 NOTE — ED Notes (Signed)
 Called Thomas at [facility] for transport 17:42

## 2024-02-25 NOTE — H&P (Addendum)
 History and Physical    Patient: Alison Brewer AOZ:[id number] DOB: [birth year] DOA: 02/24/2024 DOS: the patient was seen and examined on 02/25/2024 PCP: Sharon Seller, NP  Patient coming from: Home  Chief Complaint:  Chief Complaint  Patient presents with   Hypertension   HPI: Alison Brewer is a 89 y.o. female with medical history significant of Alzheimer's dementia, hypertension, hyperlipidemia, history of squamous cell carcinoma of the skin in remission, osteoporosis presenting to the ED for altered mental status.  Patient with dementia, unable to provide history.  Spoke with grandson/caregiver, Ivin Booty, who helped provide relevant history.  Lucila Maine states that patient has been having worsening dementia.  Has been evaluated by neurology who advised that patient is losing more of her executive function (worsening debility, no longer able to write, worse with numbers, etc.).  Yesterday he noted that patient was walking around without her depends and had urinated on herself.  He attempted to help patient take a shower but she refused.  Given her worsening mental status, he called EMS.  On arrival vital signs were fairly stable.  States that patient was not complaining of any systemic symptoms, chest pain, shortness of breath.  During my encounter, patient denies any nausea, vomiting, fevers, chills, chest pain, palpitations, shortness of breath, abdominal pain, urinary changes.  Patient does have some level of supervision at home from grandson and his wife.  They have also hired a service called [employer] who help supervise patient from Tuesday to Friday.  Grandson noting that family is having very difficult time caring for patient.  Grandson notes that patient's wishes, prior to worsening dementia, were to remain at home and obtain care from her family instead of moving to a nursing facility.  Upon arrival to the ED, noted to have blood pressure of 183/76 on multiple rechecks.  She is not on  any antihypertensive medications at home.  Other vital signs stable.  CBC with hemoglobin at baseline, otherwise unremarkable.  CMP with kidney function at baseline, otherwise unremarkable.  Urinalysis unremarkable.  Initial troponin elevated at 148, down to 124 on repeat.  EKG with sinus rhythm and LVH, similar to priors.  No acute ischemic changes noted on EKG.  Chest x-ray without any acute abnormalities.  CT head without any acute intracranial abnormalities.  [facility] hospitalist asked to evaluate patient for admission.  Review of Systems: As mentioned in the history of present illness. All other systems reviewed and are negative. 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 Her smoking use included cigarettes. She started smoking about 63 years ago. She has a 27 pack-year smoking history. She has never used smokeless tobacco. She reports that she does not drink alcohol and does not use drugs.  No Known Allergies  Family History  Problem Relation Age of Onset   Pancreatic cancer Mother    Coronary artery disease Father    Cancer Brother    Breast cancer Maternal Aunt     Prior to Admission medications   Medication Sig Start Date End Date Taking?  Authorizing Provider  albuterol (VENTOLIN HFA) 108 (90 Base) MCG/ACT inhaler Inhale 1-2 puffs into the lungs every 6 (six) hours as needed for wheezing or shortness of breath. 08/12/22   Particia Nearing, PA-C  alendronate (FOSAMAX) 70 MG tablet TAKE ONE TABLET BY MOUTH EVERY SEVEN DAYS WITH A FULL GLASS OF WATER ON AN EMPTY STOMACH 01/10/24   Sharon Seller, NP  aspirin  EC 81 MG tablet Take 81 mg by mouth daily. Swallow whole.    [provider]  Calcium Carb-Cholecalciferol (CALCIUM 600+D3 PO) Take 1 tablet by mouth 2 (two) times daily.    [provider]  ciclopirox (PENLAC) 8 % solution APPLY TOPICALLY AT BEDTIME. APPLY OVER NAIL AND SURROUNDING SKIN. APPLY DAILY OVER PREVIOUS COAT. AFTER SEVEN (7) DAYS, MAY REMOVE WITH ALCOHOL AND CONTINUE CYCLE. 01/09/24   Willeen Niece, MD  ESTRING 7.5 MCG/24HR vaginal ring Place 1 each vaginally every 3 (three) months. 07/20/23   [provider]  memantine (NAMENDA) 10 MG tablet Take 1 tablet (10 mg total) by mouth 2 (two) times daily. 06/22/23   Marcos Eke, PA-C  Multiple Vitamin (MULTIVITAMIN) tablet Take 1 tablet by mouth daily.    [provider]  sodium chloride (MURO 128) 2 % ophthalmic solution Place 1 drop into both eyes daily.    [provider]  triamcinolone cream (KENALOG) 0.1 % Apply twice daily to lower back up to 2 weeks as needed for itching. Avoid applying to face, groin, and axilla. 01/07/24   Willeen Niece, MD    Physical Exam: Vitals:   02/25/24 1600 02/25/24 1630 02/25/24 1828 02/25/24 1921  BP: 133/60 (!) 121/59  (!) 143/63  Pulse:  63  72  Resp: 15 15  15   Temp:   98 F (36.7 C) 98.8 F (37.1 C)  TempSrc:   Oral Oral  SpO2:  98%  98%  Weight:    57.2 kg  Height:    5\' 7"  (1.702 m)   Physical Exam Constitutional:      Appearance: Normal appearance. She is not ill-appearing.  HENT:     Head: Normocephalic and atraumatic.     Mouth/Throat:     Mouth: Mucous membranes are dry.     Pharynx: Oropharynx is clear. No oropharyngeal exudate.  Eyes:     General: No scleral icterus.    Extraocular Movements: Extraocular movements intact.     Conjunctiva/sclera: Conjunctivae normal.     Pupils: Pupils are equal, round, and reactive to light.  Cardiovascular:     Rate and Rhythm: Normal rate and regular rhythm.     Heart sounds: Normal heart sounds.  No murmur heard.    No friction rub. No gallop.  Pulmonary:     Effort: Pulmonary effort is normal.     Breath sounds: Normal breath sounds. No wheezing, rhonchi or rales.  Abdominal:     General: Bowel sounds are normal. There is no distension.     Palpations: Abdomen is soft.     Tenderness: There is no abdominal tenderness. There is no guarding or rebound.  Musculoskeletal:        General: No swelling. Normal range of motion.     Cervical back: Normal range of motion.  Skin:    General: Skin is warm and dry.  Neurological:     Mental Status: She is alert.     Comments: Pleasantly confused. Oriented to person, but not to place or time. No focal deficits noted. Moving all extremities  spontaneously. Unable to follow simple commands.   Psychiatric:        Mood and Affect: Mood normal.        Behavior: Behavior normal.     Data Reviewed:  There are no new results to review at this time.    Latest Ref Rng & Units 02/24/2024    5:38 PM 01/07/2024    2:17 PM 06/01/2023    3:56 PM  CBC  WBC 4.0 - 10.5 K/uL 6.2  6.4  4.9   Hemoglobin 12.0 - 15.0 g/dL 86.5  78.4  69.6   Hematocrit 36.0 - 46.0 % 33.8  34.3  32.3   Platelets 150 - 400 K/uL 188  192  178       Latest Ref Rng & Units 02/24/2024    5:38 PM 01/07/2024    2:17 PM 06/01/2023    3:56 PM  BMP  Glucose 70 - 99 mg/dL 87  295  89   BUN 8 - 23 mg/dL 24  28  30    Creatinine 0.44 - 1.00 mg/dL 2.84  1.32  4.40   BUN/Creat Ratio 6 - 22 (calc)  26  27   Sodium 135 - 145 mmol/L 139  144  139   Potassium 3.5 - 5.1 mmol/L 4.2  4.4  4.6   Chloride 98 - 111 mmol/L 103  107  104   CO2 22 - 32 mmol/L 28  31  29    Calcium 8.9 - 10.3 mg/dL 9.0  9.7  9.5    CT Head Wo Contrast Result Date: 02/24/2024 CLINICAL DATA:  Altered mental status. Personal history of dementia. EXAM: CT HEAD WITHOUT CONTRAST TECHNIQUE: Contiguous axial images were obtained from the base of the skull through the vertex without intravenous contrast. RADIATION DOSE  REDUCTION: This exam was performed according to the departmental dose-optimization program which includes automated exposure control, adjustment of the mA and/or kV according to patient size and/or use of iterative reconstruction technique. COMPARISON:  CT head without contrast 01/09/2023. FINDINGS: Brain: Moderate atrophy and white matter changes are stable. No acute infarct, hemorrhage, or mass lesion is present. The ventricles are proportionate to the degree of atrophy. The callosal angle is 100 degrees, stable. Deep brain nuclei are within normal limits. No significant extraaxial fluid collection is present. The brainstem and cerebellum are within normal limits. Midline structures are within normal limits. Vascular: Atherosclerotic calcifications are again noted within the cavernous internal carotid arteries bilaterally. No hyperdense vessel is present. Skull: Calvarium is intact. No focal lytic or blastic lesions are present. No significant extracranial soft tissue lesion is present. Sinuses/Orbits: Minimal fluid is present in the right sphenoid sinus. The paranasal sinuses and mastoid air cells are otherwise clear. The globes and orbits are within normal limits. IMPRESSION: 1. No acute intracranial abnormality or significant interval change. 2. Stable moderate atrophy and white matter disease. This likely reflects the sequela of chronic microvascular ischemia. 3. Minimal fluid in the right sphenoid sinus. Electronically Signed   By: Marin Roberts M.D.   On: 02/24/2024 18:08   DG Chest Portable 1 View Result Date: 02/24/2024 CLINICAL DATA:  Shortness of breath EXAM: PORTABLE CHEST 1 VIEW COMPARISON:  X-ray 08/07/2022. FINDINGS: Hyperinflation. Enlarged cardiopericardial silhouette calcified aorta. No consolidation, pneumothorax or effusion. No edema. Chronic lung changes. Degenerative changes of the spine. Osteopenia. IMPRESSION: Hyperinflation with chronic changes. Electronically Signed   By: Karen Kays M.D.   On: 02/24/2024 17:21     Assessment and Plan: No  notes have been filed under this hospital service. Service: Hospitalist  Hypertensive emergency Patient initially presented for altered mental status which appears to be from progressive dementia but unable to rule out hypertensive emergency as etiology.  On arrival to the ED was noted to have blood pressure 183/76.  Also found to have elevated troponins.  Other workup so far unrevealing.  Her presentation is consistent with hypertensive emergency given elevated blood pressure on arrival and signs of endorgan damage with worsening confusion and elevated troponins.  Patient not on any antihypertensives at home.  Patient was started on Norvasc 5 mg daily in the ED with improvement in her blood pressures to the 120s-140s/50s-60s.  Her troponin did begin to downtrend on recheck and thus stop trending.  EKG without any acute ischemic changes noted, appears similar to priors.  Though she remains confused, appears to be closer to her baseline per discussions with grandson.  I suspect that her remaining confusion is likely around her baseline given significant Alzheimer's dementia. -Continue Norvasc 5 mg daily -trend BP curve -Follow-up echocardiogram -Follow-up TSH -Regular diet -Lovenox for DVT prophylaxis -Telemetry -Place in observation  Progressive Alzheimer's dementia Worsening debility Patient with known diagnosis of dementia.  Follows neurology in the outpatient setting who have advised patient and family that she has worsening executive function from progression of her dementia. Lucila Maine states that family has been having some difficulty with caring for her at home though patient's wishes prior to worsening of her dementia were to remain at home and not go to a nursing facility. Will consult transitions of care and obtain PT/OT eval to determine her level of care and to see what help we can provide for her at home.  Of note, CT head  negative for any acute intracranial abnormalities.  Chest x-ray and urinalysis unremarkable for infection and she has remained afebrile without leukocytosis. -Transitions of care consulted -PT/OT eval -Delirium precautions -Fall precautions  Chronic normocytic anemia Dating back to about a year ago.  Baseline hemoglobin around 11.  On admission, hemoglobin 11.3. -Follow-up B12, folate levels -Trend hemoglobin curve  Hyperlipidemia -Not on any medications for this at home -Given age and comorbid conditions, statin therapy unlikely to provide further benefit   Advance Care Planning:   Code Status: Full Code, confirmed with grandson who is medical guardian.  Consults: none  Family Communication: updated family via phone call  Severity of Illness: The appropriate patient status for this patient is OBSERVATION. Observation status is judged to be reasonable and necessary in order to provide the required intensity of service to ensure the patient's safety. The patient's presenting symptoms, physical exam findings, and initial radiographic and laboratory data in the context of their medical condition is felt to place them at decreased risk for further clinical deterioration. Furthermore, it is anticipated that the patient will be medically stable for discharge from the hospital within 2 midnights of admission.   Portions of this note were generated with Scientist, clinical (histocompatibility and immunogenetics). Dictation errors may occur despite best attempts at proofreading.   Author: Briscoe Burns, MD 02/25/2024 8:25 PM  For on call review [email or web address].

## 2024-02-25 NOTE — ED Notes (Signed)
 Spoke with grandson with updates. Currently awaiting bed placement. Pt resting and VSS. Jillyn Hidden, RN

## 2024-02-26 ENCOUNTER — Ambulatory Visit: Payer: PPO | Admitting: Urology

## 2024-02-26 ENCOUNTER — Observation Stay (HOSPITAL_BASED_OUTPATIENT_CLINIC_OR_DEPARTMENT_OTHER)

## 2024-02-26 ENCOUNTER — Other Ambulatory Visit (HOSPITAL_COMMUNITY): Payer: Self-pay

## 2024-02-26 ENCOUNTER — Encounter (HOSPITAL_COMMUNITY): Payer: Self-pay | Admitting: Family Medicine

## 2024-02-26 DIAGNOSIS — N952 Postmenopausal atrophic vaginitis: Secondary | ICD-10-CM

## 2024-02-26 DIAGNOSIS — F0280 Dementia in other diseases classified elsewhere without behavioral disturbance: Secondary | ICD-10-CM

## 2024-02-26 DIAGNOSIS — R4182 Altered mental status, unspecified: Secondary | ICD-10-CM | POA: Diagnosis not present

## 2024-02-26 DIAGNOSIS — I161 Hypertensive emergency: Secondary | ICD-10-CM | POA: Diagnosis not present

## 2024-02-26 DIAGNOSIS — R7989 Other specified abnormal findings of blood chemistry: Secondary | ICD-10-CM | POA: Diagnosis not present

## 2024-02-26 DIAGNOSIS — N390 Urinary tract infection, site not specified: Secondary | ICD-10-CM

## 2024-02-26 LAB — BASIC METABOLIC PANEL
Anion gap: 10 (ref 5–15)
BUN: 20 mg/dL (ref 8–23)
CO2: 24 mmol/L (ref 22–32)
Calcium: 8.8 mg/dL — ABNORMAL LOW (ref 8.9–10.3)
Chloride: 109 mmol/L (ref 98–111)
Creatinine, Ser: 0.96 mg/dL (ref 0.44–1.00)
GFR, Estimated: 58 mL/min — ABNORMAL LOW (ref >60)
Glucose, Bld: 101 mg/dL — ABNORMAL HIGH (ref 70–99)
Potassium: 4.1 mmol/L (ref 3.5–5.1)
Sodium: 143 mmol/L (ref 135–145)

## 2024-02-26 LAB — ECHOCARDIOGRAM COMPLETE
Area-P 1/2: 1.97 cm2
Calc EF: 38.1 %
Height: 67 in
S' Lateral: 4 cm
Single Plane A2C EF: 25.9 %
Single Plane A4C EF: 43.8 %
Weight: 2017.65 [oz_av]

## 2024-02-26 LAB — CBC
HCT: 33.8 % — ABNORMAL LOW (ref 36.0–46.0)
Hemoglobin: 11 g/dL — ABNORMAL LOW (ref 12.0–15.0)
MCH: 30.7 pg (ref 26.0–34.0)
MCHC: 32.5 g/dL (ref 30.0–36.0)
MCV: 94.4 fL (ref 80.0–100.0)
Platelets: 189 10*3/uL (ref 150–400)
RBC: 3.58 MIL/uL — ABNORMAL LOW (ref 3.87–5.11)
RDW: 13.7 % (ref 11.5–15.5)
WBC: 5.4 10*3/uL (ref 4.0–10.5)
nRBC: 0 % (ref 0.0–0.2)

## 2024-02-26 MED ORDER — PERFLUTREN LIPID MICROSPHERE
1.0000 mL | INTRAVENOUS | Status: AC | PRN
Start: 2024-02-26 — End: 2024-02-26
  Administered 2024-02-26: 5 mL via INTRAVENOUS

## 2024-02-26 MED ORDER — ENSURE ENLIVE PO LIQD
237.0000 mL | Freq: Two times a day (BID) | ORAL | Status: DC
  Administered 2024-02-26: 237 mL via ORAL

## 2024-02-26 MED ORDER — AMLODIPINE BESYLATE 2.5 MG PO TABS: 2.5000 mg | ORAL_TABLET | Freq: Every day | ORAL | 0 refills | Status: DC

## 2024-02-26 MED ORDER — ZINC OXIDE 40 % EX OINT
1.0000 | TOPICAL_OINTMENT | Freq: Two times a day (BID) | CUTANEOUS | Status: DC
  Administered 2024-02-26: 1 via TOPICAL
  Filled 2024-02-26: qty 57

## 2024-02-26 MED ORDER — AMLODIPINE BESYLATE 5 MG PO TABS
5.0000 mg | ORAL_TABLET | Freq: Every day | ORAL | 0 refills | Status: DC
Start: 2024-02-27 — End: 2024-02-26
  Filled 2024-02-26: qty 30, 30d supply, fill #0

## 2024-02-26 NOTE — Evaluation (Signed)
 Occupational Therapy Evaluation Patient Details Name: Alison Brewer MRN: [id number] DOB: [birth year] Today's Date: 02/26/2024   History of Present Illness   89 y.o. female presenting to the ED 02/25/24 for altered mental status. CT head no acute changes; elevated troponins; HTN emergency 183/76;  PMH significant of Alzheimer's dementia, hypertension, hyperlipidemia, history of squamous cell carcinoma of the skin in remission, osteoporosis     Clinical Impressions PTA, pt lives with family, has aide assist 4 days/wk for ADLs with family assisting on other days as needed. Pt is typically ambulatory with a rollator. Pt presents now with deficits in standing balance, strength and cognition though likely near baseline. Overall, pt requires Min A for mobility using Rollator (poor hand placement and sequencing with this familiar DME), Min A for UB ADL and Mod A for LB ADLs. Pt requires consistent sequencing cues to complete tasks and will likely progress better once back in familiar environment. Recommend wheelchair and hospital bed w/ consideration of HHOT to maximize pt independence and assist with caregiver education.      If plan is discharge home, recommend the following:   A little help with walking and/or transfers;A lot of help with bathing/dressing/bathroom;Direct supervision/assist for medications management;Direct supervision/assist for financial management     Functional Status Assessment   Patient has had a recent decline in their functional status and demonstrates the ability to make significant improvements in function in a reasonable and predictable amount of time.     Equipment Recommendations   Hospital bed;Wheelchair (measurements OT);Wheelchair cushion (measurements OT)     Recommendations for Other Services         Precautions/Restrictions   Precautions Precautions: Fall Precaution/Restrictions Comments: fell last year per grandson Restrictions Weight  Bearing Restrictions Per Provider Order: No     Mobility Bed Mobility Overal bed mobility: Needs Assistance Bed Mobility: Supine to Sit, Sit to Supine     Supine to sit: Min assist, HOB elevated, Used rails Sit to supine: Min assist   General bed mobility comments: assist to scoot hips to EOB and assist for BLE back to bed    Transfers Overall transfer level: Needs assistance Equipment used: Rollator (4 wheels) Transfers: Sit to/from Stand Sit to Stand: Min assist           General transfer comment: Min A to stand from bedside and BSC over toilet      Balance Overall balance assessment: Needs assistance, History of Falls Sitting-balance support: No upper extremity supported, Feet supported Sitting balance-Leahy Scale: Fair     Standing balance support: Bilateral upper extremity supported, During functional activity, Reliant on assistive device for balance Standing balance-Leahy Scale: Poor                             ADL either performed or assessed with clinical judgement   ADL Overall ADL's : Needs assistance/impaired Eating/Feeding: Set up;Sitting Eating/Feeding Details (indicate cue type and reason): assist to open containers, prep salad, etc Grooming: Minimal assistance;Standing   Upper Body Bathing: Minimal assistance;Sitting   Lower Body Bathing: Sitting/lateral leans;Sit to/from stand;Moderate assistance   Upper Body Dressing : Minimal assistance;Sitting   Lower Body Dressing: Moderate assistance;Sitting/lateral leans;Sit to/from stand   Toilet Transfer: Minimal assistance;Ambulation;Rollator (4 wheels) Toilet Transfer Details (indicate cue type and reason): BSC over toilet. significant cues/assist needed to navigate rollator into bathroom, cues to hold Rollator handles appropriately and assist to lock brakes (per family, pt uses Rollator at home)  Toileting- Clothing Manipulation and Hygiene: Moderate assistance;Sit to/from  stand;Sitting/lateral lean Toileting - Clothing Manipulation Details (indicate cue type and reason): assist for hygiene and clothing mgmt     Functional mobility during ADLs: Minimal assistance;Rollator (4 wheels);Cueing for sequencing;Cueing for safety General ADL Comments: Significant cueing needed to sequence ADLs and bathroom mobility     Vision Baseline Vision/History: 1 Wears glasses Ability to See in Adequate Light: 0 Adequate Patient Visual Report: No change from baseline Vision Assessment?: No apparent visual deficits     Perception         Praxis         Pertinent Vitals/Pain Pain Assessment Pain Assessment: No/denies pain     Extremity/Trunk Assessment Upper Extremity Assessment Upper Extremity Assessment: Generalized weakness;Right hand dominant   Lower Extremity Assessment Lower Extremity Assessment: Defer to PT evaluation   Cervical / Trunk Assessment Cervical / Trunk Assessment: Kyphotic   Communication Communication Communication: No apparent difficulties   Cognition Arousal: Alert Behavior During Therapy: Flat affect Cognition: History of cognitive impairments             OT - Cognition Comments: hx of dementia, difficulty answering questions. step by step cues to sequence tasks                 Following commands: Impaired Following commands impaired: Follows one step commands with increased time     Cueing  General Comments   Cueing Techniques: Verbal cues;Gestural cues  Female visitor present, able to provide background info, reports that she lives with pt   Exercises     Shoulder Instructions      Home Living Family/patient expects to be discharged to:: Private residence Living Arrangements: Other relatives (grandson + grandson's significant other (?)) Available Help at Discharge: Family;Available 24 hours/day Type of Home: House Home Access: Stairs to enter [employer] of Steps: 5 Entrance Stairs-Rails:  Right;Left;Can reach both Home Layout: One level     Bathroom Shower/Tub: Producer, television/film/video: Standard Bathroom Accessibility: No   Home Equipment: Rollator (4 wheels);Shower seat;Grab bars - tub/shower   Additional Comments: asked for ramp and bathroom modifications from financial guardian 1.5 yrs ago with no results yet      Prior Functioning/Environment Prior Level of Function : Needs assist             Mobility Comments: rollator at all times; sleeps on the couch since her husband died--having difficulty getting up from the couch ADLs Comments: pt will not let grandson help her with shower, dressing, toileting but will allow females to assist. has hired an Engineer, production 4 days per week    OT Problem List: Decreased activity tolerance;Decreased strength;Impaired balance (sitting and/or standing);Decreased cognition;Decreased safety awareness;Decreased knowledge of use of DME or AE   OT Treatment/Interventions: Self-care/ADL training;Therapeutic exercise;Energy conservation;DME and/or AE instruction;Therapeutic activities;Patient/family education;Balance training      OT Goals(Current goals can be found in the care plan section)   Acute Rehab OT Goals Patient Stated Goal: hopeful for pt to come home today OT Goal Formulation: With patient/family Time For Goal Achievement: 03/11/24 Potential to Achieve Goals: Fair   OT Frequency:  Min 1X/week    Co-evaluation              AM-PAC OT "6 Clicks" Daily Activity     Outcome Measure Help from another person eating meals?: A Little Help from another person taking care of personal grooming?: A Little Help from another person toileting, which includes using toliet,  bedpan, or urinal?: A Lot Help from another person bathing (including washing, rinsing, drying)?: A Lot Help from another person to put on and taking off regular upper body clothing?: A Little Help from another person to put on and taking off regular  lower body clothing?: A Lot 6 Click Score: 15   End of Session Equipment Utilized During Treatment: Rollator (4 wheels)  Activity Tolerance: Patient tolerated treatment well Patient left: in bed;with call bell/phone within reach;with bed alarm set  OT Visit Diagnosis: Muscle weakness (generalized) (M62.81);Other abnormalities of gait and mobility (R26.89);Other symptoms and signs involving cognitive function                Time: 9528-4132 OT Time Calculation (min): 34 min Charges:  OT General Charges $OT Visit: 1 Visit OT Evaluation $OT Eval Low Complexity: 1 Low OT Treatments $Self Care/Home Management : 8-22 mins  Bradd Canary, OTR/L Acute Rehab Services Office: [phone]   Lorre Munroe 02/26/2024, 2:21 PM

## 2024-02-26 NOTE — Consult Note (Signed)
 WOC Nurse Consult Note: Reason for Consult: open wound L buttock  Wound type: 1.  Stage 2 Pressure Injury L buttock  2. Moisture Associated Skin Damage to B buttocks/sacrum  ICD-10 CM Codes for Irritant Dermatitis  L24A2 - Due to fecal, urinary or dual incontinence Pressure Injury POA: Yes Measurement: see nursing flowsheet  Wound bed: pink and moist  Drainage (amount, consistency, odor) per nursing flowsheet  Periwound: generalized erythema to B buttocks/sacrum r/t moisture and friction  Dressing procedure/placement/frequency: Cleanse buttocks/sacrum with soap and water, dry and apply a thin layer of Desitin 2 times a day and as needed for soiling.  Apply silicone foam to area of Stage 2 Pressure Injury L buttock, lift daily to assess.  Reconsult WOC team if any necrotic (brown/black/yellow) tissue develops.   POC discussed with primary nurse. WOC team will not follow. Re-consult if further needs arise.   Thank you,    Priscella Mann MSN, RN-BC, [employer] [phone]

## 2024-02-26 NOTE — Progress Notes (Signed)
Heart Failure Navigator Progress Note  Assessed for Heart & Vascular TOC clinic readiness.  Patient does not meet criteria due to per MD note patient with history of dementia.   Navigator will sign off at this time.   Rhae Hammock, BSN, Scientist, clinical (histocompatibility and immunogenetics) Only

## 2024-02-26 NOTE — Care Management Obs Status (Signed)
 MEDICARE OBSERVATION STATUS NOTIFICATION   Patient Details  Name: NATASH BERMAN MRN: [id number] Date of Birth: 1935-06-29   Medicare Observation Status Notification Given:  Yes    Kingsley Plan, RN 02/26/2024, 12:15 PM

## 2024-02-26 NOTE — Hospital Course (Addendum)
 88 YOM W/ Alzheimer's dementia, hypertension, hyperlipidemia, history of squamous cell carcinoma of the skin in remission, osteoporosis presenting to the ED for altered mental status-with complaint of worsening dementia per grandson/caregiver-has been losing more off her educated function, worsening debility and brought to the ED with worsening mental status (walking around without her depends and had urinated on herself.refused shower ).  Family having difficult time caring for the patient. In the ED hypertensive afebrile, not hypoxic, stable CMP CBC normal B12 folate alert elevated troponin 148> 124 CT head no acute finding, EKG no ischemic changes.  Chest x-ray without acute finding.  UA unremarkable Suspected to have a hypertensive emergency with progressively worsening sending dementia and debility and admitted. Seen by PT OT did well mentation overall stable with baseline dementia. A.m. labs stable.

## 2024-02-26 NOTE — Evaluation (Signed)
 Physical Therapy Evaluation Patient Details Name: Alison Brewer MRN: [id number] DOB: January 23, 1935 Today's Date: 02/26/2024  History of Present Illness  89 y.o. female presenting to the ED 02/25/24 for altered mental status. CT head no acute changes; elevated troponins; HTN emergency 183/76;  PMH significant of Alzheimer's dementia, hypertension, hyperlipidemia, history of squamous cell carcinoma of the skin in remission, osteoporosis  Clinical Impression   Pt admitted secondary to problem above with deficits below. PTA patient was transferring with up to min assist at times (at times supervision), walking with rollator with supervision, and required assist on the 5 steps to enter with rails. Pt currently requires min assist for bed mobility, transfers and gait with RW. Spoke with grandson, Ivin Booty, via phone and obtained history and home set-up. Reports he has been trying to get approval from pt's financial guardian to install a ramp x 1.5 yrs. Discussed possibility of use of rental ramp to cover the 2 steps to enter on the side of the house (no rails). Patient has begun to have difficulty getting up from the couch she sleeps on and could benefit from a hospital bed with rails and mattress designed for night-time incontinence. Finally, pt would benefit from wheelchair for use to enter/exit home (even if family has to "bump" pt up the steps if no ramp obtainted. Stairs have also become more of an issue.  Anticipate patient will benefit from PT to address problems listed below.Will continue to follow acutely to maximize functional mobility independence and safety.           If plan is discharge home, recommend the following: A little help with walking and/or transfers;A little help with bathing/dressing/bathroom;Direct supervision/assist for medications management;Direct supervision/assist for financial management;Assist for transportation;Help with stairs or ramp for entrance;Supervision due to cognitive  status   Can travel by private vehicle        Equipment Recommendations Wheelchair (measurements PT);Hospital bed;Other (comment) (portable ramp for entrance with 2 steps)  Recommendations for Other Services  OT consult    Functional Status Assessment Patient has had a recent decline in their functional status and demonstrates the ability to make significant improvements in function in a reasonable and predictable amount of time.     Precautions / Restrictions Precautions Precautions: Fall Precaution/Restrictions Comments: fell last year per grandson      Mobility  Bed Mobility Overal bed mobility: Needs Assistance Bed Mobility: Supine to Sit, Sit to Supine     Supine to sit: Min assist, Used rails Sit to supine: Min assist   General bed mobility comments: HOB flat; incr effort and time to pull herself up to sit; assist to scoot forward to sit on EOB; assist to raise legs up onto bed    Transfers Overall transfer level: Needs assistance Equipment used: Rolling walker (2 wheels) Transfers: Sit to/from Stand Sit to Stand: Min assist           General transfer comment: from EOB and from [facility] (over toilet)    Ambulation/Gait Ambulation/Gait assistance: Min assist Gait Distance (Feet): 12 Feet (toileted, 12) Assistive device: Rolling walker (2 wheels) Gait Pattern/deviations: Step-to pattern, Decreased stride length, Trunk flexed   Gait velocity interpretation: <1.31 ft/sec, indicative of household ambulator   General Gait Details: very slow (baseline per grandson); required assist to maneuver RW in turns and cues for fully backing up to surfaces prior to sitting  Stairs            Wheelchair Mobility     Tilt Bed  Modified Rankin (Stroke Patients Only)       Balance Overall balance assessment: Needs assistance, History of Falls Sitting-balance support: No upper extremity supported, Feet supported Sitting balance-Leahy Scale: Fair     Standing  balance support: Bilateral upper extremity supported, During functional activity, Reliant on assistive device for balance Standing balance-Leahy Scale: Poor                               Pertinent Vitals/Pain Pain Assessment Pain Assessment: No/denies pain    Home Living Family/patient expects to be discharged to:: Private residence Living Arrangements: Other relatives (grandson lives with her) Available Help at Discharge: Family;Available 24 hours/day Type of Home: House Home Access: Stairs to enter Entrance Stairs-Rails: Right;Left;Can reach both Entrance Stairs-Number of Steps: 5 (2 step entrance on side where they want to put a ramp)   Home Layout: One level Home Equipment: Rollator (4 wheels);Shower seat;Grab bars - tub/shower Additional Comments: asked for ramp and bathroom modifications from financial guardian 1.5 yrs ago with no results yet    Prior Function Prior Level of Function : Needs assist             Mobility Comments: rollator at all times; sleeps on the couch since her husband died--having difficulty getting up from the couch ADLs Comments: pt will not let grandson help her with shower, dressing, toileting; has hired an Engineer, production to help with shower several days per week     Extremity/Trunk Assessment   Upper Extremity Assessment Upper Extremity Assessment: Defer to OT evaluation    Lower Extremity Assessment Lower Extremity Assessment: Generalized weakness (rt weaker than left per grandson (old injury from her youth))    Cervical / Trunk Assessment Cervical / Trunk Assessment: Kyphotic  Communication   Communication Communication: No apparent difficulties    Cognition Arousal: Alert Behavior During Therapy: Flat affect   PT - Cognitive impairments: History of cognitive impairments                         Following commands: Impaired Following commands impaired: Follows one step commands with increased time     Cueing  Cueing Techniques: Verbal cues, Gestural cues     General Comments General comments (skin integrity, edema, etc.): Called grandson for home set-up and prior functional status.    Exercises     Assessment/Plan    PT Assessment Patient needs continued PT services  PT Problem List Decreased strength;Decreased balance;Decreased mobility;Decreased cognition;Decreased knowledge of use of DME;Decreased skin integrity       PT Treatment Interventions DME instruction;Gait training;Stair training;Functional mobility training;Therapeutic activities;Therapeutic exercise;Cognitive remediation;Patient/family education    PT Goals (Current goals can be found in the Care Plan section)  Acute Rehab PT Goals Patient Stated Goal: unable to state; grandson wants to keep pt at home PT Goal Formulation: With family Time For Goal Achievement: 03/11/24 Potential to Achieve Goals: Good    Frequency Min 2X/week     Co-evaluation               AM-PAC PT "6 Clicks" Mobility  Outcome Measure Help needed turning from your back to your side while in a flat bed without using bedrails?: A Little Help needed moving from lying on your back to sitting on the side of a flat bed without using bedrails?: A Little Help needed moving to and from a bed to a chair (including a wheelchair)?: A Little  Help needed standing up from a chair using your arms (e.g., wheelchair or bedside chair)?: A Little Help needed to walk in hospital room?: A Little Help needed climbing 3-5 steps with a railing? : A Lot 6 Click Score: 17    End of Session Equipment Utilized During Treatment: Gait belt Activity Tolerance: Patient tolerated treatment well Patient left: in bed;with call bell/phone within reach;with bed alarm set;with nursing/sitter in room;Other (comment) (ordered chair cushion for OOB) Nurse Communication: Mobility status;Other (comment) (ordered chair cushion for OOB) PT Visit Diagnosis: Other abnormalities of gait  and mobility (R26.89);Muscle weakness (generalized) (M62.81);History of falling (Z91.81)    Time: 4540-9811 PT Time Calculation (min) (ACUTE ONLY): 41 min   Charges:   PT Evaluation $PT Eval Moderate Complexity: 1 Mod PT Treatments $Gait Training: 8-22 mins PT General Charges $$ ACUTE PT VISIT: 1 Visit          Jerolyn Center, PT Acute Rehabilitation Services  Office [phone]   Zena Amos 02/26/2024, 10:49 AM

## 2024-02-26 NOTE — Discharge Summary (Addendum)
 Physician Discharge Summary  Alison Brewer ZOX:[id number] DOB: October 29, 1935 DOA: 02/24/2024  PCP: Sharon Seller, NP  Admit date: 02/24/2024 Discharge date: 02/26/2024 Recommendations for Outpatient Follow-up:  Follow up with PCP in 1 weeks-call for appointment Please obtain BMP/CBC in one week  Discharge Dispo: home w/ hh Discharge Condition: Stable Code Status:   Code Status: Full Code Diet recommendation:  Diet Order             Diet regular Room service appropriate? Yes; Fluid consistency: Thin  Diet effective now                   Brief/Interim Summary: 89 YOM W/ Alzheimer's dementia, hypertension, hyperlipidemia, history of squamous cell carcinoma of the skin in remission, osteoporosis presenting to the ED for altered mental status-with complaint of worsening dementia per grandson/caregiver-has been losing more off her educated function, worsening debility and brought to the ED with worsening mental status (walking around without her depends and had urinated on herself.refused shower ).  Family having difficult time caring for the patient. In the ED hypertensive afebrile, not hypoxic, stable CMP CBC normal B12 folate alert elevated troponin 148> 124 CT head no acute finding, EKG no ischemic changes.  Chest x-ray without acute finding.  UA unremarkable Suspected to have a hypertensive emergency with progressively worsening sending dementia and debility and admitted. Seen by PT OT did well mentation overall stable with baseline dementia. A.m. labs stable.   Discharge Diagnoses:  Principal Problem:   Altered mental status  Uncontrolled hypertension/hypertensive emergency Elevated troponin likely demand ischemia: BP has stabilized with amlodipine -cahnged to 2.5 as bp soft.Hold if blood pressure less than 130 follow-up with PCP   Progressively worsening dementia Alzheimer's Debility deconditioning Difficulty to care at home: Folate vitamin B12 TSH stable.  CT head no acute  finding.  Right supportive care delirium precaution avoid sedative psychotropic medication.  PT OT consult identified and arranging for home with home health.  Patient's grandson is agreeable for discharge home today   Chronic normocytic anemia Hemoglobin stable folate B12 normal   HLD-not on meds  Pressure ulcers sacrum stage II wound care advised Pressure Ulcer: Pressure Injury 02/25/24 Sacrum Posterior Stage 2 -  Partial thickness loss of dermis presenting as a shallow open injury with a red, pink wound bed without slough. (Active)  02/25/24 1925  Location: Sacrum  Location Orientation: Posterior  Staging: Stage 2 -  Partial thickness loss of dermis presenting as a shallow open injury with a red, pink wound bed without slough.  Wound Description (Comments):   Present on Admission: Yes  Dressing Type Foam - Lift dressing to assess site every shift 02/26/24 1013    Consults: none Subjective: Alert awake oriented resting comfortably  Discharge Exam: Vitals:   02/26/24 0800 02/26/24 1236  BP: 129/62 (!) 105/53  Pulse: 61 73  Resp: 18 18  Temp:  99 F (37.2 C)  SpO2: 96% 97%   General: Pt is alert, awake, not in acute distress Cardiovascular: RRR, S1/S2 +, no rubs, no gallops Respiratory: CTA bilaterally, no wheezing, no rhonchi Abdominal: Soft, NT, ND, bowel sounds + Extremities: no edema, no cyanosis  Discharge Instructions  Discharge Instructions     Discharge instructions   Complete by: As directed    Check blood pressure daily at home before giving medication and follow-up with PCP to adjust. ' Please call call MD or return to ER for similar or worsening recurring problem that brought you to hospital or  if any fever,nausea/vomiting,abdominal pain, uncontrolled pain, chest pain,  shortness of breath or any other alarming symptoms.  Please follow-up your doctor as instructed in a week time and call the office for appointment.  Please avoid alcohol, smoking, or any  other illicit substance and maintain healthy habits including taking your regular medications as prescribed.  You were cared for by a hospitalist during your hospital stay. If you have any questions about your discharge medications or the care you received while you were in the hospital after you are discharged, you can call the unit and ask to speak with the hospitalist on call if the hospitalist that took care of you is not available.  Once you are discharged, your primary care physician will handle any further medical issues. Please note that NO REFILLS for any discharge medications will be authorized once you are discharged, as it is imperative that you return to your primary care physician (or establish a relationship with a primary care physician if you do not have one) for your aftercare needs so that they can reassess your need for medications and monitor your lab values   Discharge wound care:   Complete by: As directed    Cleanse buttocks/sacrum with soap and water, dry and apply a thin layer of Desitin 2 times a day and as needed for soiling.  Apply silicone foam to area of Stage 2 Pressure Injury L buttock, lift daily to assess.  Reconsult WOC team if any necrotic (brown/black/yellow) tissue develops.   Increase activity slowly   Complete by: As directed       Allergies as of 02/26/2024   No Known Allergies      Medication List     TAKE these medications    albuterol 108 (90 Base) MCG/ACT inhaler Commonly known as: VENTOLIN HFA Inhale 1-2 puffs into the lungs every 6 (six) hours as needed for wheezing or shortness of breath.   alendronate 70 MG tablet Commonly known as: FOSAMAX TAKE ONE TABLET BY MOUTH EVERY SEVEN DAYS WITH A FULL GLASS OF WATER ON AN EMPTY STOMACH   amLODipine 2.5 MG tablet Commonly known as: NORVASC Take 1 tablet (2.5 mg total) by mouth daily. Hold if blood pressure less than 130 systolic Start taking on: February 27, 2024   aspirin EC 81 MG tablet Take  81 mg by mouth daily. Swallow whole.   CALCIUM 600+D3 PO Take 1 tablet by mouth 2 (two) times daily.   ciclopirox 8 % solution Commonly known as: PENLAC APPLY TOPICALLY AT BEDTIME. APPLY OVER NAIL AND SURROUNDING SKIN. APPLY DAILY OVER PREVIOUS COAT. AFTER SEVEN (7) DAYS, MAY REMOVE WITH ALCOHOL AND CONTINUE CYCLE.   Estring 7.5 MCG/24HR vaginal ring Generic drug: estradiol Place 1 each vaginally every 3 (three) months.   memantine 10 MG tablet Commonly known as: NAMENDA Take 1 tablet (10 mg total) by mouth 2 (two) times daily.   multivitamin tablet Take 1 tablet by mouth daily.   sodium chloride 2 % ophthalmic solution Commonly known as: MURO 128 Place 1 drop into both eyes daily.               Durable Medical Equipment  (From admission, onward)           Start     Ordered   02/26/24 1218  For home use only DME standard manual wheelchair with seat cushion  Once       Comments: Patient suffers from weakness , altered mental status  which impairs their  ability to perform daily activities like ambulating  in the home.  A cane  will not resolve issue with performing activities of daily living. A wheelchair will allow patient to safely perform daily activities. Patient can safely propel the wheelchair in the home or has a caregiver who can provide assistance. Length of need lifetime . Accessories: elevating leg rests (ELRs), wheel locks, extensions and anti-tippers.  Seat and back cushions   02/26/24 1218   02/26/24 1217  For home use only DME Hospital bed  Once       Comments: Call grandson Seleen Walter [phone]  Question Answer Comment  Length of Need Lifetime   Patient has (list medical condition): weakness altered menatl status   The above medical condition requires: Patient requires the ability to reposition frequently   Head must be elevated greater than: 45 degrees   Bed type Semi-electric   Support Surface: Gel Overlay      02/26/24 1218               Discharge Care Instructions  (From admission, onward)           Start     Ordered   02/26/24 0000  Discharge wound care:       Comments: Cleanse buttocks/sacrum with soap and water, dry and apply a thin layer of Desitin 2 times a day and as needed for soiling.  Apply silicone foam to area of Stage 2 Pressure Injury L buttock, lift daily to assess.  Reconsult WOC team if any necrotic (brown/black/yellow) tissue develops.   02/26/24 1301            Follow-up Information     Sharon Seller, NP Follow up in 1 week(s).   Specialty: Geriatric Medicine Contact information: [address] [place] [address] [phone]                No Known Allergies  The results of significant diagnostics from this hospitalization (including imaging, microbiology, ancillary and laboratory) are listed below for reference.    Microbiology: Recent Results (from the past 240 hours)  MRSA Next Gen by PCR, Nasal     Status: None   Collection Time: 02/25/24  7:57 PM   Specimen: Nasal Mucosa; Nasal Swab  Result Value Ref Range Status   MRSA by PCR Next Gen NOT DETECTED NOT DETECTED Final    Comment: (NOTE) The GeneXpert MRSA Assay (FDA approved for NASAL specimens only), is one component of a comprehensive MRSA colonization surveillance program. It is not intended to diagnose MRSA infection nor to guide or monitor treatment for MRSA infections. Test performance is not FDA approved in patients less than 63 years old. Performed at [facility], [address] [place], [place] [address]     Procedures/Studies: ECHOCARDIOGRAM COMPLETE Result Date: 02/26/2024    ECHOCARDIOGRAM REPORT   Patient Name:   TAELYNN MCELHANNON Date of Exam: 02/26/2024 Medical Rec #:  [id number]      Height:       67.0 in Accession #:    [id number]     Weight:       126.1 lb Date of Birth:  September 11, 1935      BSA:          1.662 m Patient Age:    88 years       BP:           129/62 mmHg Patient  Gender: F  HR:           63 bpm. Exam Location:  Inpatient Procedure: 2D Echo, Cardiac Doppler, Color Doppler and Intracardiac            Opacification Agent (Both Spectral and Color Flow Doppler were            utilized during procedure). Indications:    Elevated Troponin  History:        Patient has no prior history of Echocardiogram examinations.                 Risk Factors:Hypertension and Dyslipidemia.  Sonographer:    Harriette Bouillon RDCS Referring Phys: [id number] SAGAR H JINWALA IMPRESSIONS  1. Left ventricular ejection fraction, by estimation, is 25 to 30%. The left ventricle has severely decreased function. The left ventricle demonstrates regional wall motion abnormalities (see scoring diagram/findings for description). Left ventricular diastolic parameters are consistent with Grade II diastolic dysfunction (pseudonormalization).  2. Right ventricular systolic function is mildly reduced. The right ventricular size is normal.  3. Left atrial size was mildly dilated.  4. The mitral valve is normal in structure. Trivial mitral valve regurgitation.  5. The aortic valve was not well visualized. Aortic valve regurgitation is trivial.  6. The inferior vena cava is dilated in size with <50% respiratory variability, suggesting right atrial pressure of 15 mmHg. FINDINGS  Left Ventricle: Left ventricular ejection fraction, by estimation, is 25 to 30%. The left ventricle has severely decreased function. The left ventricle demonstrates regional wall motion abnormalities. Definity contrast agent was given IV to delineate the left ventricular endocardial borders. The left ventricular internal cavity size was normal in size. There is no left ventricular hypertrophy. Left ventricular diastolic parameters are consistent with Grade II diastolic dysfunction (pseudonormalization).  LV Wall Scoring: Inferior wall akinesis, otherwise global hypokinesis. Right Ventricle: The right ventricular size is normal. No increase  in right ventricular wall thickness. Right ventricular systolic function is mildly reduced. Left Atrium: Left atrial size was mildly dilated. Right Atrium: Right atrial size was normal in size. Pericardium: There is no evidence of pericardial effusion. Mitral Valve: The mitral valve is normal in structure. There is mild thickening of the mitral valve leaflet(s). Trivial mitral valve regurgitation. Tricuspid Valve: The tricuspid valve is normal in structure. Tricuspid valve regurgitation is not demonstrated. Aortic Valve: The aortic valve was not well visualized. Aortic valve regurgitation is trivial. Pulmonic Valve: The pulmonic valve was grossly normal. Pulmonic valve regurgitation is trivial. Aorta: The aortic root and ascending aorta are structurally normal, with no evidence of dilitation. Venous: The inferior vena cava is dilated in size with less than 50% respiratory variability, suggesting right atrial pressure of 15 mmHg. IAS/Shunts: No atrial level shunt detected by color flow Doppler.  LEFT VENTRICLE PLAX 2D LVIDd:         4.90 cm      Diastology LVIDs:         4.00 cm      LV e' medial:    3.81 cm/s LV PW:         1.00 cm      LV E/e' medial:  17.2 LV IVS:        0.90 cm      LV e' lateral:   7.40 cm/s LVOT diam:     2.00 cm      LV E/e' lateral: 8.8 LV SV:         77 LV SV Index:   46 LVOT Area:  3.14 cm  LV Volumes (MOD) LV vol d, MOD A2C: 134.0 ml LV vol d, MOD A4C: 107.0 ml LV vol s, MOD A2C: 99.3 ml LV vol s, MOD A4C: 60.1 ml LV SV MOD A2C:     34.7 ml LV SV MOD A4C:     107.0 ml LV SV MOD BP:      48.2 ml RIGHT VENTRICLE            IVC RV S prime:     8.27 cm/s  IVC diam: 2.20 cm TAPSE (M-mode): 1.6 cm LEFT ATRIUM             Index        RIGHT ATRIUM           Index LA diam:        3.50 cm 2.11 cm/m   RA Area:     16.80 cm LA Vol (A2C):   32.0 ml 19.25 ml/m  RA Volume:   40.80 ml  24.54 ml/m LA Vol (A4C):   53.8 ml 32.37 ml/m LA Biplane Vol: 44.3 ml 26.65 ml/m  AORTIC VALVE LVOT Vmax:    98.70 cm/s LVOT Vmean:  65.800 cm/s LVOT VTI:    0.245 m  AORTA Ao Root diam: 2.90 cm Ao Asc diam:  2.90 cm MITRAL VALVE MV Area (PHT): 1.97 cm     SHUNTS MV Decel Time: 385 msec     Systemic VTI:  0.24 m MV E velocity: 65.40 cm/s   Systemic Diam: 2.00 cm MV A velocity: 108.00 cm/s MV E/A ratio:  0.61 Clearnce Hasten Electronically signed by Clearnce Hasten Signature Date/Time: 02/26/2024/8:57:24 AM    Final    CT Head Wo Contrast Result Date: 02/24/2024 CLINICAL DATA:  Altered mental status. Personal history of dementia. EXAM: CT HEAD WITHOUT CONTRAST TECHNIQUE: Contiguous axial images were obtained from the base of the skull through the vertex without intravenous contrast. RADIATION DOSE REDUCTION: This exam was performed according to the departmental dose-optimization program which includes automated exposure control, adjustment of the mA and/or kV according to patient size and/or use of iterative reconstruction technique. COMPARISON:  CT head without contrast 01/09/2023. FINDINGS: Brain: Moderate atrophy and white matter changes are stable. No acute infarct, hemorrhage, or mass lesion is present. The ventricles are proportionate to the degree of atrophy. The callosal angle is 100 degrees, stable. Deep brain nuclei are within normal limits. No significant extraaxial fluid collection is present. The brainstem and cerebellum are within normal limits. Midline structures are within normal limits. Vascular: Atherosclerotic calcifications are again noted within the cavernous internal carotid arteries bilaterally. No hyperdense vessel is present. Skull: Calvarium is intact. No focal lytic or blastic lesions are present. No significant extracranial soft tissue lesion is present. Sinuses/Orbits: Minimal fluid is present in the right sphenoid sinus. The paranasal sinuses and mastoid air cells are otherwise clear. The globes and orbits are within normal limits. IMPRESSION: 1. No acute intracranial abnormality or  significant interval change. 2. Stable moderate atrophy and white matter disease. This likely reflects the sequela of chronic microvascular ischemia. 3. Minimal fluid in the right sphenoid sinus. Electronically Signed   By: Marin Roberts M.D.   On: 02/24/2024 18:08   DG Chest Portable 1 View Result Date: 02/24/2024 CLINICAL DATA:  Shortness of breath EXAM: PORTABLE CHEST 1 VIEW COMPARISON:  X-ray 08/07/2022. FINDINGS: Hyperinflation. Enlarged cardiopericardial silhouette calcified aorta. No consolidation, pneumothorax or effusion. No edema. Chronic lung changes. Degenerative changes of the spine. Osteopenia. IMPRESSION: Hyperinflation  with chronic changes. Electronically Signed   By: Karen Kays M.D.   On: 02/24/2024 17:21  Labs: BNP (last 3 results) No results for input(s): "BNP" in the last 8760 hours. Basic Metabolic Panel: Recent Labs  Lab 02/24/24 1738 02/26/24 0219  NA 139 143  K 4.2 4.1  CL 103 109  CO2 28 24  GLUCOSE 87 101*  BUN 24* 20  CREATININE 1.07* 0.96  CALCIUM 9.0 8.8*   Liver Function Tests: Recent Labs  Lab 02/24/24 1738  AST 22  ALT 13  ALKPHOS 91  BILITOT 0.8  PROT 6.0*  ALBUMIN 3.9   Recent Labs  Lab 02/24/24 1738 02/26/24 0219  WBC 6.2 5.4  HGB 11.3* 11.0*  HCT 33.8* 33.8*  MCV 94.4 94.4  PLT 188 189  Thyroid function studies Recent Labs    02/25/24 2215  TSH 2.871   Anemia work up Recent Labs    02/25/24 2215  VITAMINB12 734  FOLATE 18.1   Urinalysis    Component Value Date/Time   COLORURINE YELLOW 02/24/2024 1729   APPEARANCEUR CLEAR 02/24/2024 1729   APPEARANCEUR Clear 08/14/2023 1457   LABSPEC 1.018 02/24/2024 1729   PHURINE 6.5 02/24/2024 1729   GLUCOSEU NEGATIVE 02/24/2024 1729   HGBUR NEGATIVE 02/24/2024 1729   BILIRUBINUR NEGATIVE 02/24/2024 1729   BILIRUBINUR Negative 11/01/2023 1525   BILIRUBINUR Negative 08/14/2023 1457   KETONESUR NEGATIVE 02/24/2024 1729   PROTEINUR NEGATIVE 02/24/2024 1729   UROBILINOGEN  0.2 11/01/2023 1525   NITRITE NEGATIVE 02/24/2024 1729   LEUKOCYTESUR NEGATIVE 02/24/2024 1729   Sepsis Labs Recent Labs  Lab 02/24/24 1738 02/26/24 0219  WBC 6.2 5.4   Microbiology Recent Results (from the past 240 hours)  MRSA Next Gen by PCR, Nasal     Status: None   Collection Time: 02/25/24  7:57 PM   Specimen: Nasal Mucosa; Nasal Swab  Result Value Ref Range Status   MRSA by PCR Next Gen NOT DETECTED NOT DETECTED Final    Comment: (NOTE) The GeneXpert MRSA Assay (FDA approved for NASAL specimens only), is one component of a comprehensive MRSA colonization surveillance program. It is not intended to diagnose MRSA infection nor to guide or monitor treatment for MRSA infections. Test performance is not FDA approved in patients less than 17 years old. Performed at [facility], [address] [place], [place] [address]      Time coordinating discharge: 25 minutes  SIGNED: Lanae Boast, MD  [facility] 02/26/2024, 3:47 PM  If 7PM-7AM, please contact night-coverage [email or web address]

## 2024-02-26 NOTE — TOC Initial Note (Addendum)
 Transition of Care (TOC) - Initial/Assessment Note   Patient confused. Spoke to grandson Alison Brewer [phone] . Patient lives with grandson Alison Brewer [phone]  his wife and their child.   Patient has [facility] 4 days a week for 3 hours at a time.   PT recommending HHPT. Patient has had [facility] in the past and grandson Alison Brewer [phone] would like them again. Amy with [facility] accepted referral.   PT also recommending hospital bed and wheelchair . Same ordered with [facility]. Asked MD to sign all orders for DME, and HH and co sign progress note.   Nursing to provide wound care education    NCM completed HTA Prior Authorization Request for Custodial Care Benefits if approved will be 20 hours , grandson Alison Brewer [phone] aware   Patient Details  Name: Alison Brewer MRN: [id number] Date of Birth: Jan 31, 1935  Transition of Care [facility]) CM/SW Contact:    Kingsley Plan, RN Phone Number: 02/26/2024, 12:23 PM  Clinical Narrative:                   Expected Discharge Plan: Home w Home Health Services Barriers to Discharge: Continued Medical Work up   Patient Goals and CMS Choice   CMS Medicare.gov Compare Post Acute Care list provided to:: Patient Represenative (must comment) (grandson [name] [phone]) Choice offered to / list presented to :  (grandson [name] [phone])      Expected Discharge Plan and Services   Discharge Planning Services: CM Consult Post Acute Care Choice: Home Health, Durable Medical Equipment Living arrangements for the past 2 months: Single Family Home                 DME Arranged: Hospital bed (hoyer lift) DME Agency: [facility] Date DME Agency Contacted: 02/26/24 Time DME Agency Contacted: 1222 Representative spoke with at DME Agency: Ian Malkin HH Arranged: PT, OT HH Agency: [facility] Date [facility] Agency Contacted: 02/26/24 Time HH Agency Contacted: 1223 Representative spoke with at [facility]  Agency: Amy  Prior Living Arrangements/Services Living arrangements for the past 2 months: Single Family Home Lives with::  (grandson Alison Brewer [phone])                   Activities of Daily Living   ADL Screening (condition at time of admission) Independently performs ADLs?: No Does the patient have a NEW difficulty with bathing/dressing/toileting/self-feeding that is expected to last >3 days?: Yes (Initiates electronic notice to provider for possible OT consult) Does the patient have a NEW difficulty with getting in/out of bed, walking, or climbing stairs that is expected to last >3 days?: Yes (Initiates electronic notice to provider for possible PT consult) Does the patient have a NEW difficulty with communication that is expected to last >3 days?: No Is the patient deaf or have difficulty hearing?: No Does the patient have difficulty seeing, even when wearing glasses/contacts?: No Does the patient have difficulty concentrating, remembering, or making decisions?: Yes  Permission Sought/Granted      Share Information with NAME: grandson Alison Brewer [phone]           Emotional Assessment              Admission diagnosis:  Hypertensive crisis [I16.9] Altered mental status [R41.82] Elevated troponin [R79.89] Patient Active Problem List   Diagnosis Date Noted   Altered mental status 02/24/2024  HTN (hypertension) 07/02/2023   Former smoker 07/02/2023   Hyperlipidemia 07/02/2023   History of squamous cell carcinoma 07/02/2023   Vaginal atrophy 07/02/2023   Arthritis 07/01/2023   Recurrent UTI 07/01/2023   Dementia due to Alzheimer's disease (HCC) 05/07/2022   PCP:  Sharon Seller, NP Pharmacy:   [facility] - [place], [place] - [address] [place] [address] Phone: [phone] Fax: [phone]     Social Drivers of Health (SDOH) Social History: SDOH Screenings   Food Insecurity: Patient Unable  To Answer (02/25/2024)  Housing: Patient Unable To Answer (02/25/2024)  Transportation Needs: Patient Unable To Answer (02/26/2024)  Utilities: Patient Unable To Answer (02/26/2024)  Depression (PHQ2-9): Low Risk  (11/01/2023)  Social Connections: Unknown (02/26/2024)  Tobacco Use: Medium Risk (02/26/2024)   SDOH Interventions:     Readmission Risk Interventions     No data to display

## 2024-02-26 NOTE — Plan of Care (Signed)
  Problem: Education: Goal: Knowledge of General Education information will improve Description: Including pain rating scale, medication(s)/side effects and non-pharmacologic comfort measures Outcome: Not Progressing   Problem: Health Behavior/Discharge Planning: Goal: Ability to manage health-related needs will improve Outcome: Progressing   Problem: Clinical Measurements: Goal: Ability to maintain clinical measurements within normal limits will improve Outcome: Progressing Goal: Will remain free from infection Outcome: Progressing Goal: Diagnostic test results will improve Outcome: Progressing Goal: Respiratory complications will improve Outcome: Progressing Goal: Cardiovascular complication will be avoided Outcome: Progressing   Problem: Activity: Goal: Risk for activity intolerance will decrease Outcome: Not Progressing   Problem: Nutrition: Goal: Adequate nutrition will be maintained Outcome: Progressing   Problem: Coping: Goal: Level of anxiety will decrease Outcome: Progressing   Problem: Elimination: Goal: Will not experience complications related to bowel motility Outcome: Progressing Goal: Will not experience complications related to urinary retention Outcome: Progressing   Problem: Pain Managment: Goal: General experience of comfort will improve and/or be controlled Outcome: Progressing   Problem: Safety: Goal: Ability to remain free from injury will improve Outcome: Progressing   Problem: Skin Integrity: Goal: Risk for impaired skin integrity will decrease Outcome: Not Progressing

## 2024-02-26 NOTE — TOC CM/SW Note (Signed)
    Durable Medical Equipment  (From admission, onward)           Start     Ordered   02/26/24 1218  For home use only DME standard manual wheelchair with seat cushion  Once       Comments: Patient suffers from weakness , altered mental status  which impairs their ability to perform daily activities like ambulating  in the home.  A cane  will not resolve issue with performing activities of daily living. A wheelchair will allow patient to safely perform daily activities. Patient can safely propel the wheelchair in the home or has a caregiver who can provide assistance. Length of need lifetime . Accessories: elevating leg rests (ELRs), wheel locks, extensions and anti-tippers.  Seat and back cushions   02/26/24 1218   02/26/24 1217  For home use only DME Hospital bed  Once       Comments: Call grandson Keyani Rigdon [phone]  Question Answer Comment  Length of Need Lifetime   Patient has (list medical condition): weakness altered menatl status   The above medical condition requires: Patient requires the ability to reposition frequently   Head must be elevated greater than: 45 degrees   Bed type Semi-electric   Support Surface: Gel Overlay      02/26/24 1218

## 2024-02-26 NOTE — Progress Notes (Signed)
  Echocardiogram 2D Echocardiogram has been performed.  Leda Roys RDCS 02/26/2024, 8:51 AM

## 2024-02-27 DIAGNOSIS — G309 Alzheimer's disease, unspecified: Secondary | ICD-10-CM | POA: Diagnosis not present

## 2024-02-27 DIAGNOSIS — I10 Essential (primary) hypertension: Secondary | ICD-10-CM | POA: Diagnosis not present

## 2024-02-27 DIAGNOSIS — F0280 Dementia in other diseases classified elsewhere without behavioral disturbance: Secondary | ICD-10-CM | POA: Diagnosis not present

## 2024-02-27 DIAGNOSIS — D649 Anemia, unspecified: Secondary | ICD-10-CM | POA: Diagnosis not present

## 2024-02-27 DIAGNOSIS — Z85828 Personal history of other malignant neoplasm of skin: Secondary | ICD-10-CM | POA: Diagnosis not present

## 2024-02-27 DIAGNOSIS — M810 Age-related osteoporosis without current pathological fracture: Secondary | ICD-10-CM | POA: Diagnosis not present

## 2024-02-27 DIAGNOSIS — Z87440 Personal history of urinary (tract) infections: Secondary | ICD-10-CM | POA: Diagnosis not present

## 2024-02-27 DIAGNOSIS — L89152 Pressure ulcer of sacral region, stage 2: Secondary | ICD-10-CM | POA: Diagnosis not present

## 2024-02-27 DIAGNOSIS — Z87891 Personal history of nicotine dependence: Secondary | ICD-10-CM | POA: Diagnosis not present

## 2024-02-27 DIAGNOSIS — M1990 Unspecified osteoarthritis, unspecified site: Secondary | ICD-10-CM | POA: Diagnosis not present

## 2024-02-27 DIAGNOSIS — E785 Hyperlipidemia, unspecified: Secondary | ICD-10-CM | POA: Diagnosis not present

## 2024-02-27 DIAGNOSIS — Z7982 Long term (current) use of aspirin: Secondary | ICD-10-CM | POA: Diagnosis not present

## 2024-02-27 DIAGNOSIS — I161 Hypertensive emergency: Secondary | ICD-10-CM | POA: Diagnosis not present

## 2024-02-27 NOTE — TOC CM/SW Note (Addendum)
 Received a message from Hope with [employer]. The financial guardian is unable to put any type of card or any auto pay for her . Per [employer]'s intake manager Tabatha they are unable to process order and provide DME .   NCM called Jermaine with [employer], they would also need a card on file or auto pay set up to provide DME.

## 2024-02-28 ENCOUNTER — Telehealth

## 2024-02-28 ENCOUNTER — Telehealth: Payer: Self-pay

## 2024-02-28 NOTE — Transitions of Care (Post Inpatient/ED Visit) (Signed)
 02/28/2024  Name: Alison Brewer MRN: [id number] DOB: 07/21/1935  Today's TOC FU Call Status: Today's TOC FU Call Status:: Successful TOC FU Call Completed TOC FU Call Complete Date: 02/28/24 Patient's Name and Date of Birth confirmed.  Transition Care Management Follow-up Telephone Call Date of Discharge: 02/26/24 Discharge Facility: [facility]) Type of Discharge: Emergency Department Reason for ED Visit: Cardiac Conditions Cardiac Conditions Diagnosis:  (Hypertensive Crisis) How have you been since you were released from the hospital?: Same Any questions or concerns?: Yes Patient Questions/Concerns:: Patient grandson will save questions for appointment  Items Reviewed: Did you receive and understand the discharge instructions provided?: Yes Medications obtained,verified, and reconciled?: Yes (Medications Reviewed) Any new allergies since your discharge?: No Dietary orders reviewed?: No Do you have support at home?: Yes People in Home: grandchild(ren) Name of Support/Comfort Primary Source: Ivin Booty  Medications Reviewed Today: Medications Reviewed Today     Reviewed by Dicky Doe, CMA (Certified Medical Assistant) on 02/28/24 at [phone]  Med List Status: <None>   Medication Order Taking? Sig Documenting Provider Last Dose Status Informant  albuterol (VENTOLIN HFA) 108 (90 Base) MCG/ACT inhaler [id number] Yes Inhale 1-2 puffs into the lungs every 6 (six) hours as needed for wheezing or shortness of breath. Particia Nearing, PA-C Taking Active Family Member, Pharmacy Records  alendronate (FOSAMAX) 70 MG tablet [id number] Yes TAKE ONE TABLET BY MOUTH EVERY SEVEN DAYS WITH A FULL GLASS OF WATER ON AN EMPTY STOMACH Sharon Seller, NP Taking Active Family Member, Pharmacy Records  amLODipine (NORVASC) 2.5 MG tablet [id number] Yes Take 1 tablet (2.5 mg total) by mouth daily. Hold if blood pressure less than 130 systolic Kc, Ramesh, MD Taking Active   aspirin EC 81 MG  tablet [id number] Yes Take 81 mg by mouth daily. Swallow whole. [provider] Taking Active Family Member, Pharmacy Records  Calcium Carb-Cholecalciferol (CALCIUM 600+D3 PO) [id number] Yes Take 1 tablet by mouth 2 (two) times daily. [provider] Taking Active Family Member, Pharmacy Records  ciclopirox [facility]) 8 % solution [id number] Yes APPLY TOPICALLY AT BEDTIME. APPLY OVER NAIL AND SURROUNDING SKIN. APPLY DAILY OVER PREVIOUS COAT. AFTER SEVEN (7) DAYS, MAY REMOVE WITH ALCOHOL AND CONTINUE CYCLE. Willeen Niece, MD Taking Active Family Member, Pharmacy Records           Med Note [facility] [place], [place]US[place] A   Mon Feb 25, 2024  9:24 PM) Needs refilled  ESTRING 7.5 MCG/24HR vaginal ring [id number] Yes Place 1 each vaginally every 3 (three) months. [provider] Taking Active Family Member, Pharmacy Records           Med Note [facility] [place], [place] A   Mon Feb 25, 2024  9:25 PM) Unknown last dose  memantine (NAMENDA) 10 MG tablet [id number] Yes Take 1 tablet (10 mg total) by mouth 2 (two) times daily. Marcos Eke, PA-C Taking Active Family Member, Pharmacy Records  Multiple Vitamin (MULTIVITAMIN) tablet [id number] Yes Take 1 tablet by mouth daily. [provider] Taking Active Family Member, Pharmacy Records  sodium chloride (MURO 128) 2 % ophthalmic solution [id number] Yes Place 1 drop into both eyes daily. [provider] Taking Active Family Member, Pharmacy Records            Home Care and Equipment/Supplies: Were Home Health Services Ordered?: NA Any new equipment or medical supplies ordered?: NA  Functional Questionnaire: Do you need assistance with bathing/showering or dressing?: Yes Do you need assistance with meal preparation?: Yes Do you need  assistance with eating?: No Do you have difficulty maintaining continence: Yes Do you need assistance with getting out of bed/getting out of a chair/moving?: Yes Do you have difficulty  managing or taking your medications?: Yes  Follow up appointments reviewed: PCP Follow-up appointment confirmed?: Yes Date of PCP follow-up appointment?: 03/10/24 Follow-up Provider: Ella Bodo, NP Specialist Hospital Follow-up appointment confirmed?: No Do you need transportation to your follow-up appointment?: No Do you understand care options if your condition(s) worsen?: Yes-patient verbalized understanding    SIGNATURE: Esti Demello.D/RMA

## 2024-02-28 NOTE — Telephone Encounter (Signed)
 Patient grandson called and appointment scheduled 03/10/2024.

## 2024-02-28 NOTE — Telephone Encounter (Signed)
 Copied from CRM [id number]. Topic: Clinical - Home Health Verbal Orders >> Feb 28, 2024  8:38 AM Jonne Ply wrote: Caller/Agency: [facility] Callback Number: [phone] Service Requested: Physical Therapy Frequency: 2week 3 and 1 week three  Any new concerns about the patient? Patient was recently in the hospital was told that it was due to hypertension, however when the therapist checked the patient's pressure it was a little on the hypotensive side. Grandson advised that he was making a follow-up visit for patient upon discharge.

## 2024-03-07 ENCOUNTER — Telehealth: Payer: Self-pay

## 2024-03-07 NOTE — Telephone Encounter (Signed)
 I called back and verbal orders were given.

## 2024-03-07 NOTE — Telephone Encounter (Signed)
 Copied from CRM [id number]. Topic: Clinical - Home Health Verbal Orders >> Mar 06, 2024  5:00 PM Tiffany H wrote: Reason for CRM: Caller/Agency: Will S - OT [facility]  Callback Number: [phone] Service Requested: Occupational Therapy Frequency: 1x 4 weeks Any new concerns about the patient? No

## 2024-03-08 IMAGING — DX DG HIP (WITH OR WITHOUT PELVIS) 2-3V*R*
2 series · 2 of 2 positions shown · non-contrast
Comparison: None Available.

CLINICAL DATA: Fell today getting out of chair.  Right-sided pain.

EXAM:
DG HIP (WITH OR WITHOUT PELVIS) 2-3V RIGHT

[pelvis ap]
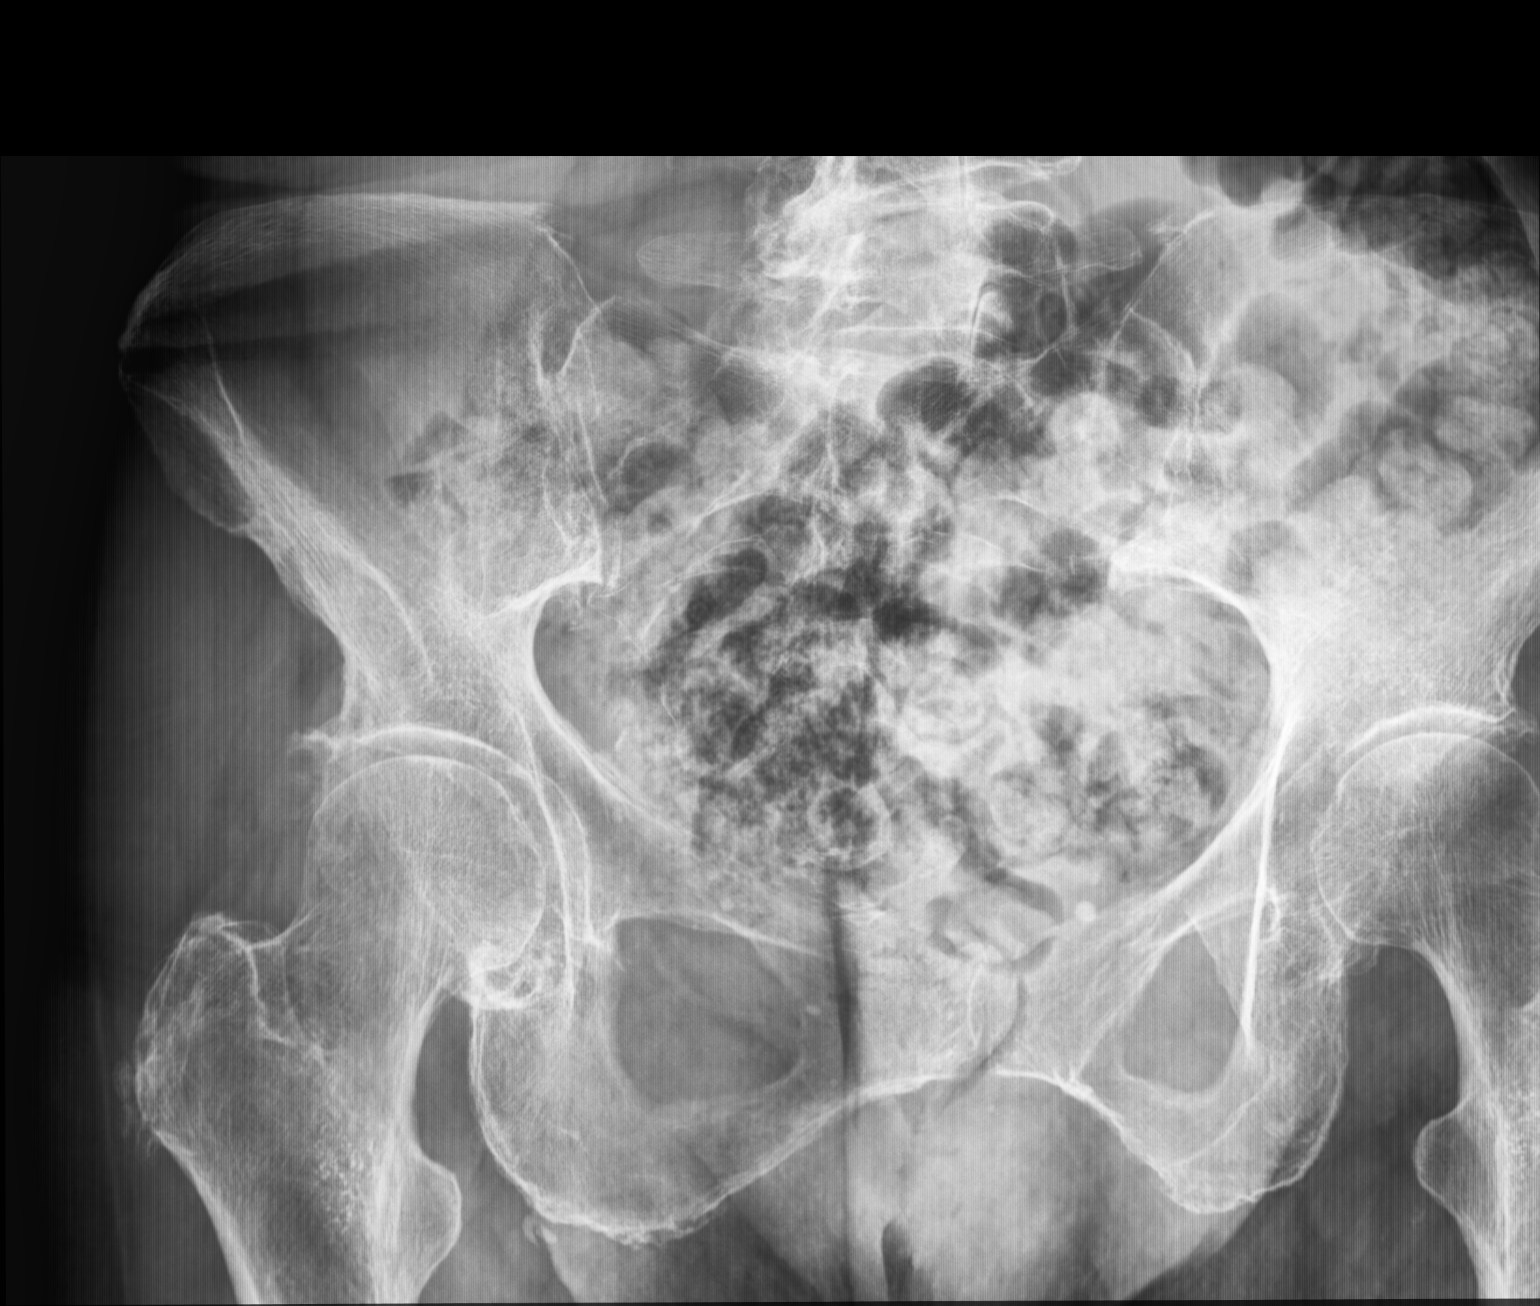

[hip ap]
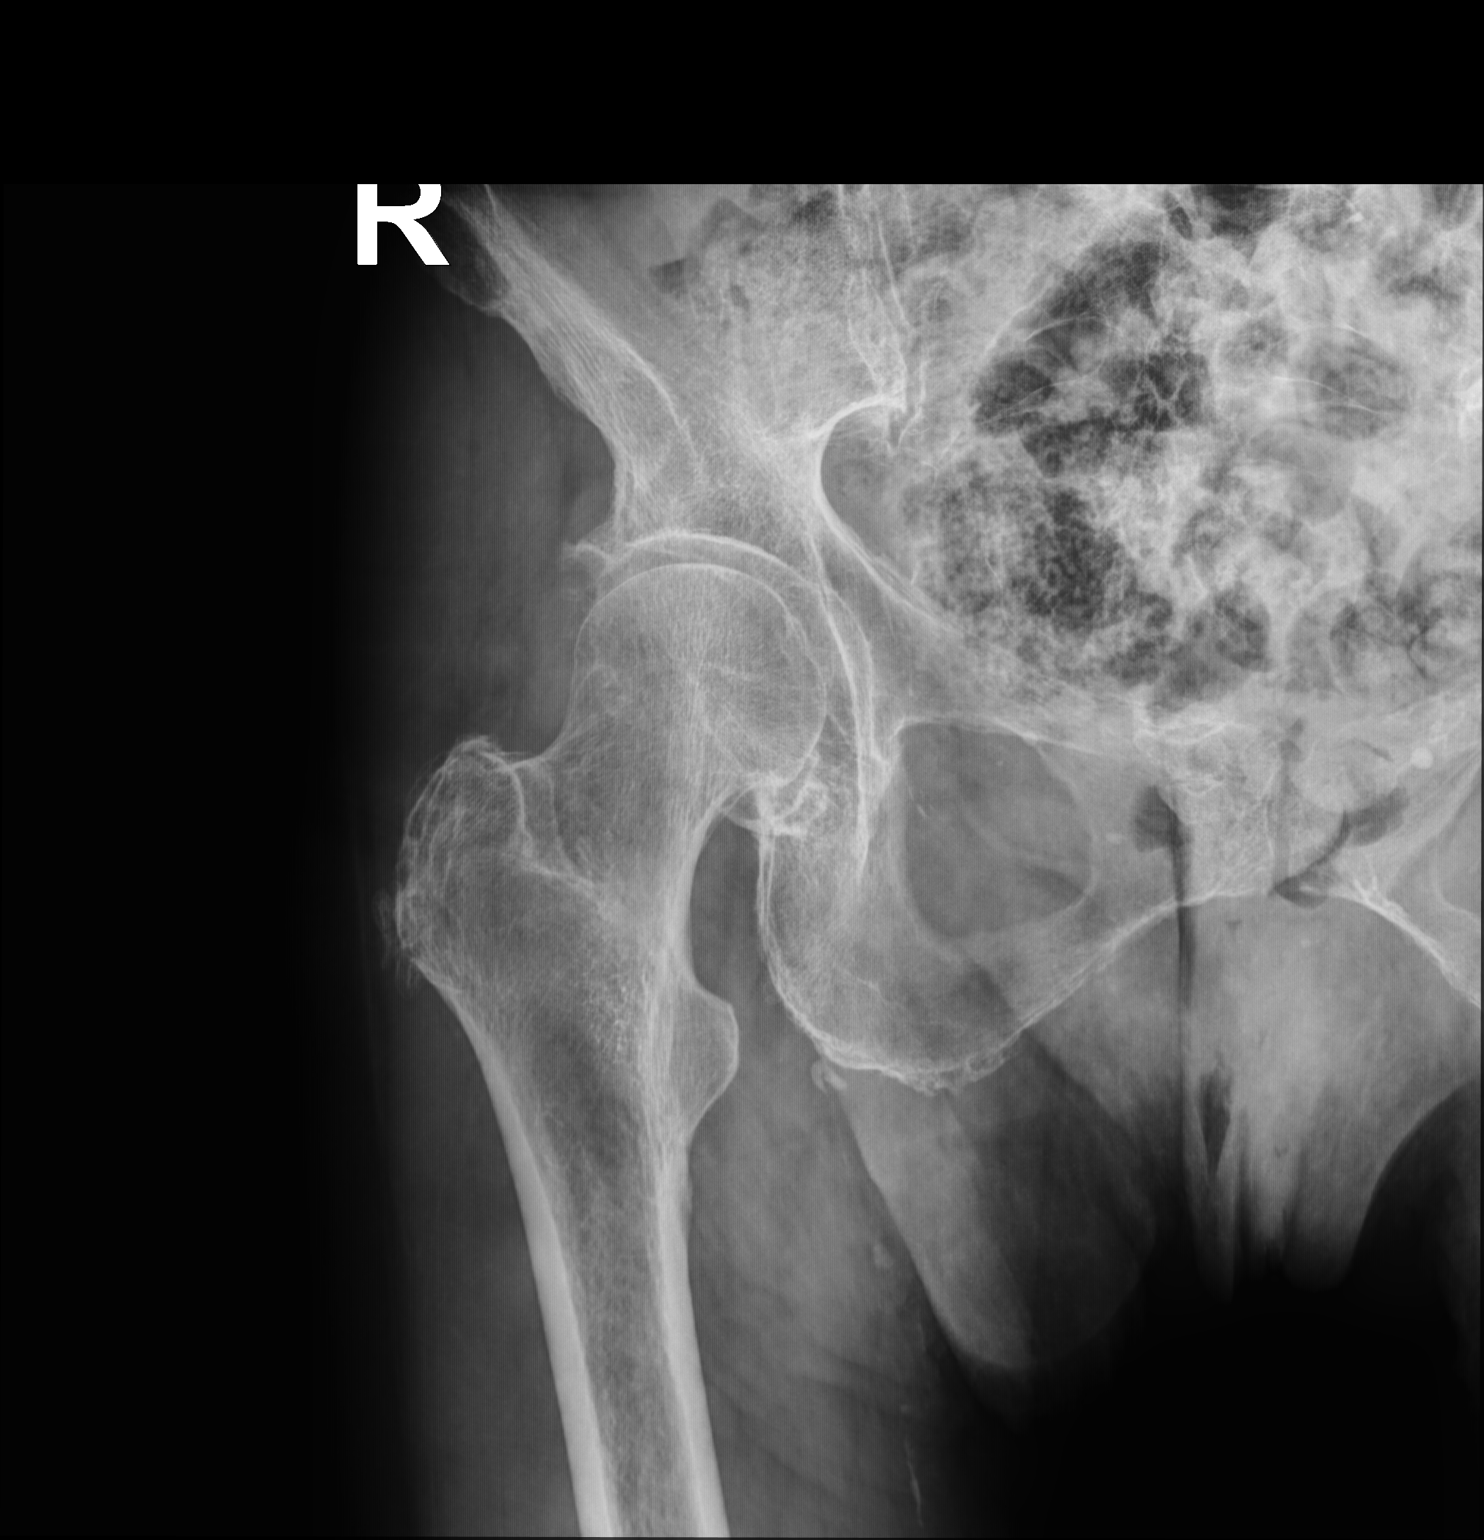

[2 of 2 positions shown; findings below may reference images not displayed]

FINDINGS: Diffuse decreased bone mineralization. Mild left-greater-than-right
superomedial femoroacetabular joint space narrowing. Evaluation for
an acute fracture is limited by only frontal views of the pelvis and
frontal view of the right hip. The technologist reports the patient
could not tolerate a frogleg lateral view. No definitive acute
fracture line is seen, however there is mild curvilinear lucency
overlying the right greater trochanter that may represent low bone
mineralization, nutrient foramina, or a nondisplaced fracture.
Recommend clinical correlation.

Moderate to severe right L4-5 disc space narrowing.

High-grade stool throughout the visualized distal colon.
IMPRESSION: Diffuse decreased bone mineralization is only stability performed
frontal views limits evaluation for an acute fracture. No definitive
acute fracture is visualized, howeverthere is mild curvilinear
lucency overlying the right greater trochanter that may represent
low bone mineralization, normal nutrient foramina, or a nondisplaced
fracture.

## 2024-03-08 IMAGING — DX DG RIBS W/ CHEST 3+V*R*
3 series · 3 of 3 positions shown · non-contrast
Comparison: 02/13/2022

CLINICAL DATA: Right lateral rib pain

EXAM:
RIGHT RIBS AND CHEST - 3+ VIEW

[chest pa]
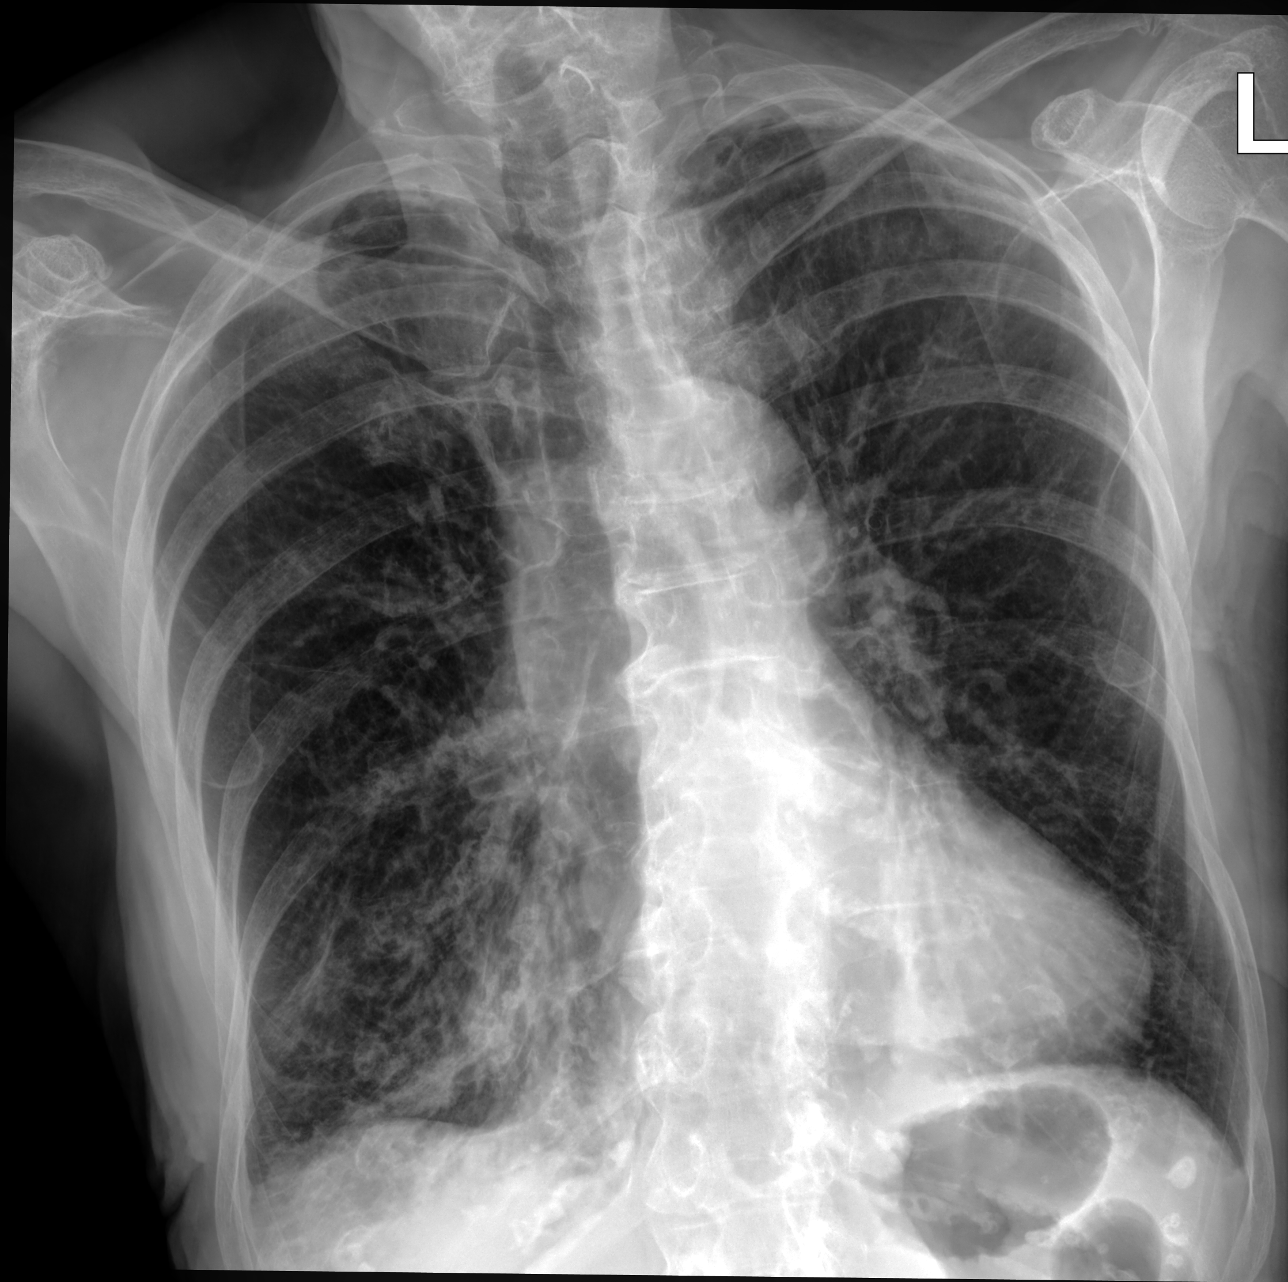

[hemithorax (ribs) ap]
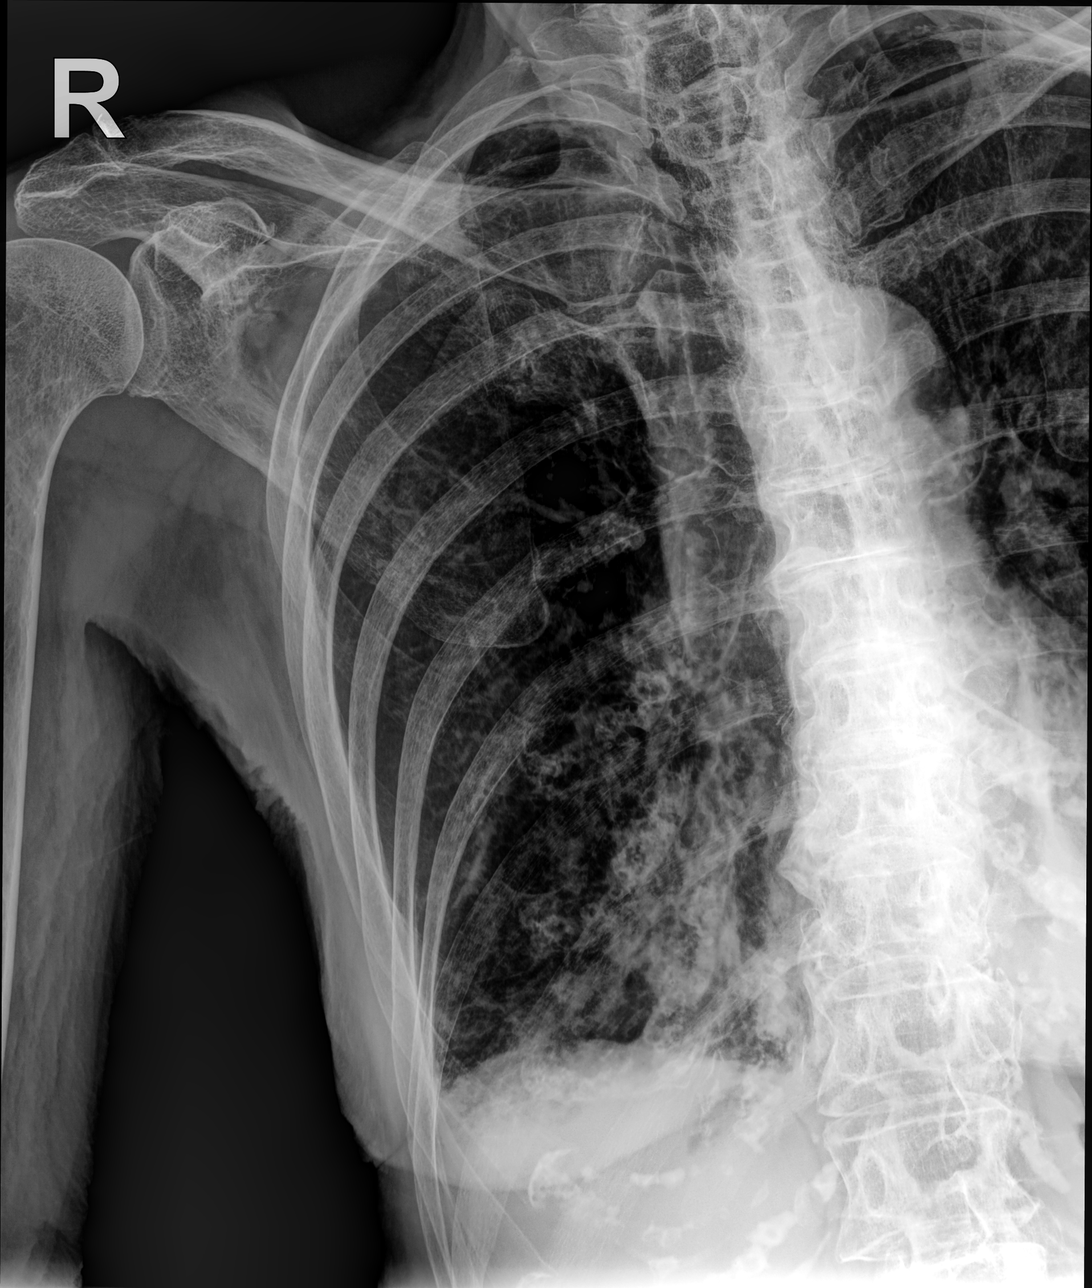

[hemithorax (ribs) mlo]
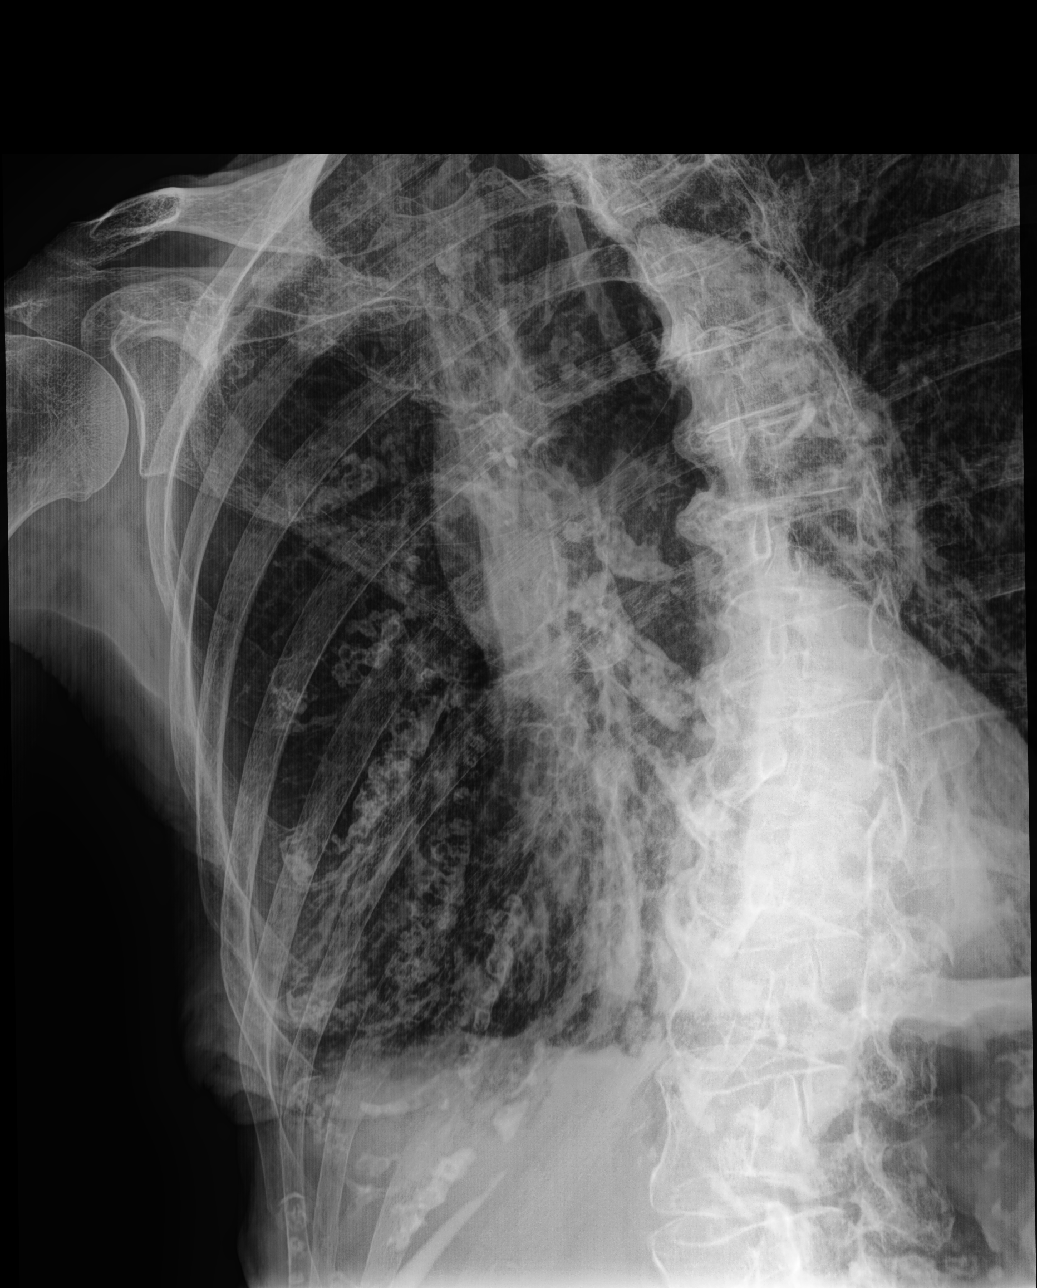

[3 of 3 positions shown; findings below may reference images not displayed]

FINDINGS: Single-view chest demonstrate streaky basilar opacities. No pleural
effusion or pneumothorax. Stable cardiomediastinal silhouette.

Right rib series demonstrates no acute displaced right rib fracture.
IMPRESSION: 1. Negative for acute displaced right rib fracture
2. Streaky basilar opacities may be due to atelectasis or mild
pneumonia

## 2024-03-10 ENCOUNTER — Encounter: Payer: Self-pay | Admitting: Adult Health

## 2024-03-10 ENCOUNTER — Ambulatory Visit (INDEPENDENT_AMBULATORY_CARE_PROVIDER_SITE_OTHER): Admitting: Adult Health

## 2024-03-10 VITALS — BP 117/88 | HR 66 | Temp 97.2°F | Resp 18 | Ht 65.0 in | Wt 126.2 lb

## 2024-03-10 DIAGNOSIS — M810 Age-related osteoporosis without current pathological fracture: Secondary | ICD-10-CM

## 2024-03-10 DIAGNOSIS — F0280 Dementia in other diseases classified elsewhere without behavioral disturbance: Secondary | ICD-10-CM | POA: Diagnosis not present

## 2024-03-10 DIAGNOSIS — Z13820 Encounter for screening for osteoporosis: Secondary | ICD-10-CM

## 2024-03-10 DIAGNOSIS — K5901 Slow transit constipation: Secondary | ICD-10-CM

## 2024-03-10 DIAGNOSIS — N390 Urinary tract infection, site not specified: Secondary | ICD-10-CM

## 2024-03-10 DIAGNOSIS — G309 Alzheimer's disease, unspecified: Secondary | ICD-10-CM | POA: Diagnosis not present

## 2024-03-10 DIAGNOSIS — I10 Essential (primary) hypertension: Secondary | ICD-10-CM | POA: Diagnosis not present

## 2024-03-10 MED ORDER — DOCUSATE SODIUM 100 MG PO CAPS: 100.0000 mg | ORAL_CAPSULE | Freq: Two times a day (BID) | ORAL | Status: DC

## 2024-03-10 NOTE — Progress Notes (Unsigned)
 [facility] clinic  Provider:  Kenard Gower DNP  Code Status:  Full Code  Goals of Care:     02/24/2024    3:54 PM  Advanced Directives  Does Patient Have a Medical Advance Directive? No     Chief Complaint  Patient presents with   Transitions Of [facility] Follow-up   Discussed the use of AI scribe software for clinical note transcription with the patient, who gave verbal consent to proceed.  HPI: Patient is a 89 y.o. female seen today for transition of care.       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   alendronate (FOSAMAX) 70 MG tablet TAKE ONE TABLET BY MOUTH EVERY SEVEN DAYS WITH A FULL GLASS OF WATER ON AN EMPTY STOMACH   amLODipine (NORVASC) 2.5 MG tablet Take 1 tablet (2.5 mg total) by mouth daily. Hold if blood pressure less than 130 systolic   aspirin EC 81 MG tablet Take 81 mg by mouth daily. Swallow whole.   Calcium Carb-Cholecalciferol (CALCIUM 600+D3 PO) Take 1 tablet by mouth 2 (two) times daily.   ciclopirox (PENLAC) 8 % solution APPLY TOPICALLY AT BEDTIME. APPLY OVER NAIL AND SURROUNDING SKIN. APPLY DAILY OVER PREVIOUS COAT. AFTER SEVEN (7) DAYS, MAY REMOVE WITH ALCOHOL AND CONTINUE CYCLE.   ESTRING 7.5 MCG/24HR vaginal ring Place 1 each vaginally every 3 (three) months.   memantine (NAMENDA) 10 MG tablet Take 1 tablet (10 mg total) by mouth 2 (two) times daily.   Multiple Vitamin  (MULTIVITAMIN) tablet Take 1 tablet by mouth daily.   sodium chloride (MURO 128) 2 % ophthalmic solution Place 1 drop into both eyes daily.   No facility-administered encounter medications on file as of 03/10/2024.    Review of Systems:  Review of Systems  Unable to obtain due to dementia.  Health Maintenance  Topic Date Due   COVID-19 Vaccine (3 - Pfizer risk series) 04/19/2020   Zoster Vaccines- Shingrix (2 of 2) 06/01/2020   INFLUENZA VACCINE  07/19/2023   Medicare Annual Wellness (AWV)  06/03/2024   DTaP/Tdap/Td (2 - Td or Tdap) 01/09/2033   Pneumonia Vaccine 73+ Years old  Completed   DEXA SCAN  Completed   HPV VACCINES  Aged Out    Physical Exam: Vitals:   03/10/24 1544  BP: 117/88  Pulse: 66  Temp: (!) 97.2 F (36.2 C)  Weight: 126 lb 3.2 oz (57.2 kg)   Body mass index is 19.77 kg/m. Physical Exam Constitutional:      Appearance: Normal appearance.  HENT:     Head: Normocephalic and atraumatic.     Nose: Nose normal.     Mouth/Throat:     Mouth: Mucous membranes are moist.  Eyes:     Conjunctiva/sclera: Conjunctivae normal.  Cardiovascular:     Rate and Rhythm: Normal rate and regular rhythm.  Pulmonary:     Effort: Pulmonary effort is normal.     Breath sounds: Normal breath sounds.  Abdominal:     General: Bowel sounds are normal.     Palpations: Abdomen is soft.  Musculoskeletal:        General: Normal range of motion.     Cervical back: Normal range of motion.  Skin:    General: Skin is warm and dry.  Neurological:     Mental Status: She is alert.  Psychiatric:        Mood and Affect: Mood normal.        Behavior: Behavior normal.     Labs reviewed: Basic Metabolic Panel: Recent Labs    04/09/23 1426 06/01/23 1556 01/07/24 1417 02/24/24 1738 02/25/24 2215 02/26/24 0219  NA 140   < > 144 139  --  143  K 4.2   < > 4.4 4.2  --  4.1  CL 104   < > 107 103  --  109  CO2 30   < > 31 28  --  24  GLUCOSE 65   < > 117 87  --  101*  BUN 31*    < > 28* 24*  --  20  CREATININE 1.29*   < > 1.06* 1.07*  --  0.96  CALCIUM 9.8   < > 9.7 9.0  --  8.8*  TSH 2.97  --   --   --  2.871  --    < > = values in this interval not displayed.   Liver Function Tests: Recent Labs    04/09/23 1426 01/07/24 1417 02/24/24 1738  AST 24 19 22   ALT 20 11 13   ALKPHOS  --   --  91  BILITOT 0.5 0.7 0.8  PROT 6.3 6.3 6.0*  ALBUMIN  --   --  3.9   No results for input(s): "LIPASE", "AMYLASE" in the last 8760 hours. No results for input(s): "AMMONIA" in the last 8760 hours. CBC: Recent Labs    04/09/23 1426 06/01/23 1556 01/07/24 1417 02/24/24 1738 02/26/24 0219  WBC 5.5 4.9 6.4 6.2 5.4  NEUTROABS 2,943 2,234 3,309  --   --   HGB 11.3* 10.8* 11.5* 11.3* 11.0*  HCT 34.1* 32.3* 34.3* 33.8* 33.8*  MCV 94.5 94.7 92.5 94.4 94.4  PLT 183 178 192 188 189   Lipid Panel: No results for input(s): "CHOL", "HDL", "LDLCALC", "TRIG", "CHOLHDL", "LDLDIRECT" in the last 8760 hours. No results found for: "HGBA1C"  Procedures since last visit: ECHOCARDIOGRAM COMPLETE Result Date: 02/26/2024    ECHOCARDIOGRAM REPORT   Patient Name:   CHRISETTE MAN Date of Exam: 02/26/2024 Medical Rec #:  [id number]      Height:       67.0 in Accession #:    [id number]     Weight:       126.1 lb Date of Birth:  1935-06-27      BSA:          1.662 m Patient Age:    88 years       BP:           129/62 mmHg Patient Gender: F              HR:           63 bpm. Exam Location:  Inpatient Procedure: 2D Echo, Cardiac Doppler, Color Doppler and Intracardiac            Opacification Agent (Both Spectral  and Color Flow Doppler were            utilized during procedure). Indications:    Elevated Troponin  History:        Patient has no prior history of Echocardiogram examinations.                 Risk Factors:Hypertension and Dyslipidemia.  Sonographer:    Harriette Bouillon RDCS Referring Phys: [id number] SAGAR H JINWALA IMPRESSIONS  1. Left ventricular ejection fraction, by estimation, is 25 to  30%. The left ventricle has severely decreased function. The left ventricle demonstrates regional wall motion abnormalities (see scoring diagram/findings for description). Left ventricular diastolic parameters are consistent with Grade II diastolic dysfunction (pseudonormalization).  2. Right ventricular systolic function is mildly reduced. The right ventricular size is normal.  3. Left atrial size was mildly dilated.  4. The mitral valve is normal in structure. Trivial mitral valve regurgitation.  5. The aortic valve was not well visualized. Aortic valve regurgitation is trivial.  6. The inferior vena cava is dilated in size with <50% respiratory variability, suggesting right atrial pressure of 15 mmHg. FINDINGS  Left Ventricle: Left ventricular ejection fraction, by estimation, is 25 to 30%. The left ventricle has severely decreased function. The left ventricle demonstrates regional wall motion abnormalities. Definity contrast agent was given IV to delineate the left ventricular endocardial borders. The left ventricular internal cavity size was normal in size. There is no left ventricular hypertrophy. Left ventricular diastolic parameters are consistent with Grade II diastolic dysfunction (pseudonormalization).  LV Wall Scoring: Inferior wall akinesis, otherwise global hypokinesis. Right Ventricle: The right ventricular size is normal. No increase in right ventricular wall thickness. Right ventricular systolic function is mildly reduced. Left Atrium: Left atrial size was mildly dilated. Right Atrium: Right atrial size was normal in size. Pericardium: There is no evidence of pericardial effusion. Mitral Valve: The mitral valve is normal in structure. There is mild thickening of the mitral valve leaflet(s). Trivial mitral valve regurgitation. Tricuspid Valve: The tricuspid valve is normal in structure. Tricuspid valve regurgitation is not demonstrated. Aortic Valve: The aortic valve was not well visualized. Aortic  valve regurgitation is trivial. Pulmonic Valve: The pulmonic valve was grossly normal. Pulmonic valve regurgitation is trivial. Aorta: The aortic root and ascending aorta are structurally normal, with no evidence of dilitation. Venous: The inferior vena cava is dilated in size with less than 50% respiratory variability, suggesting right atrial pressure of 15 mmHg. IAS/Shunts: No atrial level shunt detected by color flow Doppler.  LEFT VENTRICLE PLAX 2D LVIDd:         4.90 cm      Diastology LVIDs:         4.00 cm      LV e' medial:    3.81 cm/s LV PW:         1.00 cm      LV E/e' medial:  17.2 LV IVS:        0.90 cm      LV e' lateral:   7.40 cm/s LVOT diam:     2.00 cm      LV E/e' lateral: 8.8 LV SV:         77 LV SV Index:   46 LVOT Area:     3.14 cm  LV Volumes (MOD) LV vol d, MOD A2C: 134.0 ml LV vol d, MOD A4C: 107.0 ml LV vol s, MOD A2C: 99.3 ml LV vol s, MOD A4C: 60.1 ml LV SV MOD  A2C:     34.7 ml LV SV MOD A4C:     107.0 ml LV SV MOD BP:      48.2 ml RIGHT VENTRICLE            IVC RV S prime:     8.27 cm/s  IVC diam: 2.20 cm TAPSE (M-mode): 1.6 cm LEFT ATRIUM             Index        RIGHT ATRIUM           Index LA diam:        3.50 cm 2.11 cm/m   RA Area:     16.80 cm LA Vol (A2C):   32.0 ml 19.25 ml/m  RA Volume:   40.80 ml  24.54 ml/m LA Vol (A4C):   53.8 ml 32.37 ml/m LA Biplane Vol: 44.3 ml 26.65 ml/m  AORTIC VALVE LVOT Vmax:   98.70 cm/s LVOT Vmean:  65.800 cm/s LVOT VTI:    0.245 m  AORTA Ao Root diam: 2.90 cm Ao Asc diam:  2.90 cm MITRAL VALVE MV Area (PHT): 1.97 cm     SHUNTS MV Decel Time: 385 msec     Systemic VTI:  0.24 m MV E velocity: 65.40 cm/s   Systemic Diam: 2.00 cm MV A velocity: 108.00 cm/s MV E/A ratio:  0.61 Clearnce Hasten Electronically signed by Clearnce Hasten Signature Date/Time: 02/26/2024/8:57:24 AM    Final    CT Head Wo Contrast Result Date: 02/24/2024 CLINICAL DATA:  Altered mental status. Personal history of dementia. EXAM: CT HEAD WITHOUT CONTRAST TECHNIQUE:  Contiguous axial images were obtained from the base of the skull through the vertex without intravenous contrast. RADIATION DOSE REDUCTION: This exam was performed according to the departmental dose-optimization program which includes automated exposure control, adjustment of the mA and/or kV according to patient size and/or use of iterative reconstruction technique. COMPARISON:  CT head without contrast 01/09/2023. FINDINGS: Brain: Moderate atrophy and white matter changes are stable. No acute infarct, hemorrhage, or mass lesion is present. The ventricles are proportionate to the degree of atrophy. The callosal angle is 100 degrees, stable. Deep brain nuclei are within normal limits. No significant extraaxial fluid collection is present. The brainstem and cerebellum are within normal limits. Midline structures are within normal limits. Vascular: Atherosclerotic calcifications are again noted within the cavernous internal carotid arteries bilaterally. No hyperdense vessel is present. Skull: Calvarium is intact. No focal lytic or blastic lesions are present. No significant extracranial soft tissue lesion is present. Sinuses/Orbits: Minimal fluid is present in the right sphenoid sinus. The paranasal sinuses and mastoid air cells are otherwise clear. The globes and orbits are within normal limits. IMPRESSION: 1. No acute intracranial abnormality or significant interval change. 2. Stable moderate atrophy and white matter disease. This likely reflects the sequela of chronic microvascular ischemia. 3. Minimal fluid in the right sphenoid sinus. Electronically Signed   By: Marin Roberts M.D.   On: 02/24/2024 18:08   DG Chest Portable 1 View Result Date: 02/24/2024 CLINICAL DATA:  Shortness of breath EXAM: PORTABLE CHEST 1 VIEW COMPARISON:  X-ray 08/07/2022. FINDINGS: Hyperinflation. Enlarged cardiopericardial silhouette calcified aorta. No consolidation, pneumothorax or effusion. No edema. Chronic lung changes.  Degenerative changes of the spine. Osteopenia. IMPRESSION: Hyperinflation with chronic changes. Electronically Signed   By: Karen Kays M.D.   On: 02/24/2024 17:21    Assessment/Plan   Labs/tests ordered:   CBC, BMP   No follow-ups on file.  Dajuana Palen  Medina-Vargas, NP

## 2024-03-11 LAB — BASIC METABOLIC PANEL WITH GFR
BUN/Creatinine Ratio: 21 (calc) (ref 6–22)
BUN: 25 mg/dL (ref 7–25)
CO2: 29 mmol/L (ref 20–32)
Calcium: 9.8 mg/dL (ref 8.6–10.4)
Chloride: 106 mmol/L (ref 98–110)
Creat: 1.18 mg/dL — ABNORMAL HIGH (ref 0.60–0.95)
Glucose, Bld: 77 mg/dL (ref 65–99)
Potassium: 4.3 mmol/L (ref 3.5–5.3)
Sodium: 142 mmol/L (ref 135–146)

## 2024-03-11 LAB — CBC WITH DIFFERENTIAL/PLATELET
Absolute Lymphocytes: 2260 {cells}/uL (ref 850–3900)
Absolute Monocytes: 625 {cells}/uL (ref 200–950)
Basophils Absolute: 41 {cells}/uL (ref 0–200)
Basophils Relative: 0.7 %
Eosinophils Absolute: 100 {cells}/uL (ref 15–500)
Eosinophils Relative: 1.7 %
HCT: 34.7 % — ABNORMAL LOW (ref 35.0–45.0)
Hemoglobin: 11.5 g/dL — ABNORMAL LOW (ref 11.7–15.5)
MCH: 31 pg (ref 27.0–33.0)
MCHC: 33.1 g/dL (ref 32.0–36.0)
MCV: 93.5 fL (ref 80.0–100.0)
MPV: 10.5 fL (ref 7.5–12.5)
Monocytes Relative: 10.6 %
Neutro Abs: 2873 {cells}/uL (ref 1500–7800)
Neutrophils Relative %: 48.7 %
Platelets: 225 10*3/uL (ref 140–400)
RBC: 3.71 10*6/uL — ABNORMAL LOW (ref 3.80–5.10)
RDW: 12.7 % (ref 11.0–15.0)
Total Lymphocyte: 38.3 %
WBC: 5.9 10*3/uL (ref 3.8–10.8)

## 2024-03-11 NOTE — Progress Notes (Signed)
-  Hemoglobin 11.5, up from 11.02 weeks ago, improved

## 2024-03-13 DIAGNOSIS — I517 Cardiomegaly: Secondary | ICD-10-CM | POA: Insufficient documentation

## 2024-03-13 DIAGNOSIS — N182 Chronic kidney disease, stage 2 (mild): Secondary | ICD-10-CM | POA: Insufficient documentation

## 2024-03-13 NOTE — Progress Notes (Signed)
 Name: Alison Brewer DOB: 20-May-1935 MRN: [id number]  History of Present Illness: Ms. Alison Brewer is a 89 y.o. female who presents today for follow up visit at [facility] [place]. She is accompanied by her grandson Alison Brewer (who is her caretaker, along with his wife). Josh provided today's history due to patient's dementia. - GU history: 1. Recurrent UTI.  2. Vaginal atrophy.   Urine culture results in past 12 months: - 04/09/2023: Positive for Klebsiella pneumoniae - 06/01/2023: Positive for Klebsiella pneumoniae - 08/14/2023: Positive for Klebsiella pneumoniae - 11/01/2023: Positive for E. Coli  At last visit on 11/06/2023: Doing well. Estring exchanged.  Today: Alison Brewer reports that Mrs. Alison Brewer has had 0 UTIs since last visit.  He reports concern for possible UTI today - states that she has seemed more confused over the past 3 days and has been declining trips to the bathroom / resisting changing her clothes.    Medications: Current Outpatient Medications  Medication Sig Dispense Refill   albuterol (VENTOLIN HFA) 108 (90 Base) MCG/ACT inhaler Inhale 1-2 puffs into the lungs every 6 (six) hours as needed for wheezing or shortness of breath. 18 g 0   alendronate (FOSAMAX) 70 MG tablet TAKE ONE TABLET BY MOUTH EVERY SEVEN DAYS WITH A FULL GLASS OF WATER ON AN EMPTY STOMACH 4 tablet 11   amLODipine (NORVASC) 2.5 MG tablet Take 1 tablet (2.5 mg total) by mouth daily. Hold if blood pressure less than 130 systolic 30 tablet 0   aspirin EC 81 MG tablet Take 81 mg by mouth daily. Swallow whole.     Calcium Carb-Cholecalciferol (CALCIUM 600+D3 PO) Take 1 tablet by mouth 2 (two) times daily.     ciclopirox (PENLAC) 8 % solution APPLY TOPICALLY AT BEDTIME. APPLY OVER NAIL AND SURROUNDING SKIN. APPLY DAILY OVER PREVIOUS COAT. AFTER SEVEN (7) DAYS, MAY REMOVE WITH ALCOHOL AND CONTINUE CYCLE. 6.6 mL 6   docusate sodium (COLACE) 100 MG capsule Take 1 capsule (100 mg total) by mouth 2 (two) times  daily.     ESTRING 7.5 MCG/24HR vaginal ring Place 1 each vaginally every 3 (three) months.     memantine (NAMENDA) 10 MG tablet Take 1 tablet (10 mg total) by mouth 2 (two) times daily. 60 tablet 11   Multiple Vitamin (MULTIVITAMIN) tablet Take 1 tablet by mouth daily.     sodium chloride (MURO 128) 2 % ophthalmic solution Place 1 drop into both eyes daily.     No current facility-administered medications for this visit.    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00    Average packs/day: 1.5 packs/day for 18.0 years (27.0 ttl pk-yrs)    Types: Cigarettes    Start date: 3    Quit date: 61    Years since quitting: 45.2   Smokeless tobacco: Never  Vaping Use   Vaping status: Never Used  Substance and Sexual Activity   Alcohol use: Never   Drug use: Never   Sexual activity: Not Currently    Birth control/protection: None  Other Topics Concern   Not on file  Social History Narrative   Right handed   Drink no coffe   One story home   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   Social Drivers of Corporate investment banker Strain: Not on file  Food Insecurity:  Patient Unable To Answer (02/25/2024)   Hunger Vital Sign    Worried About Running Out of Food in the Last Year: Patient unable to answer    Ran Out of Food in the Last Year: Patient unable to answer  Transportation Needs: Patient Unable To Answer (02/26/2024)   PRAPARE - Transportation    Lack of Transportation (Medical): Patient unable to answer    Lack of Transportation (Non-Medical): Patient unable to answer  Physical Activity: Not on file  Stress: Not on file  Social Connections: Unknown (02/26/2024)   Social Connection and Isolation Panel [NHANES]    Frequency of Communication with Friends and Family: Patient unable to answer    Frequency of Social Gatherings with Friends and Family: Patient unable to answer    Attends Religious Services: Not on file    Active Member of Clubs or Organizations: Patient unable to answer    Attends Banker Meetings: Patient unable to answer    Marital Status: Patient unable to answer  Intimate Partner Violence: Patient Unable To Answer (02/26/2024)   Humiliation, Afraid, Rape, and Kick questionnaire    Fear of Current or Ex-Partner: Patient unable to answer    Emotionally Abused: Patient unable to answer    Physically Abused: Patient unable to answer    Sexually Abused: Patient unable to answer    Review of Systems - patient unable to participate in ROS due to dementia GU: As per HPI.  OBJECTIVE Vitals:   03/17/24 1401  BP: (!) 126/57  Pulse: 80   There is no height or weight on file to calculate BMI.  Physical Examination Constitutional: General appearance: Well nourished, well developed in no acute distress. Psych: Normal mood and affect, alert and oriented x 3 Neck: Supple, normal appearance.  Respiratory: Normal respiratory effort.  Cardiovascular: No lower extremity edema.  Skin: Warm and dry, no lesions.  PELVIC EXAM: The Estring was noted to be in place. It was removed and discarded. The new Estring was placed inside  vagina without any difficulty; patient tolerated this well.   Anal verge looks normal with no hemorrhoids noted; has a small superficial abrasion to left posterior aspect of anus.   Pelvic exam was chaperoned by S. Chestine Spore, CMA.  Urine microscopy (voided urine): 11-30 WBC/hpf, 3-10 RBC/hpf, many bacteria  Urine microscopy (I&O cath'd urine): 0-5 WBC/hpf, 0-2 RBC/hpf, many bacteria  ASSESSMENT Recurrent UTI - Plan: Urinalysis, Routine w reflex microscopic, Urine Culture  Vaginal atrophy - Plan: Urinalysis, Routine w reflex microscopic, Urine Culture  Urinary incontinence, unspecified type - Plan: Urinalysis, Routine w reflex microscopic, Urine Culture  Dementia due to Alzheimer's disease (HCC) -  Plan: Urinalysis, Routine w reflex microscopic, Urine Culture  Abnormal UA. Will check urine culture and treat as indicated based on results.  Estring exchanged. No acute findings on exam.  We discussed plan for urinary incontinence including timed voiding with prompting and option to purchase / utilize Purewick external catheter.   We agreed to plan for follow up in 3 months for next Estring exchange or sooner if needed. Patient verbalized understanding of and agreement with current plan. All questions were answered.  PLAN Advised the following: 1. Urine culture. 2. Timed voiding with prompting. 3. Return in about 3 months (around 06/16/2024) for f/u with Evette Georges, NP - pelvic exam for Estring exchange (pt to bring).  Orders Placed This Encounter  Procedures   Urine Culture   Urinalysis, Routine w reflex microscopic   It has been explained that the patient is to follow regularly with their PCP in addition to all other providers involved in their care and to follow instructions provided by these respective offices. Patient advised to contact urology clinic if any urologic-pertaining questions, concerns, new symptoms or problems arise in the interim period.  Patient Instructions   Female External Catheters Appear to be two main products available: Bard/Liberator PureWick Female External Catheter Phone: [phone] [email or web address]  Potential Immediate benefits of PureWickT: - Eliminate pain associated with internal catheters - Decrease risk of urinary tract infection - Minimize moisture associated skin damage and pressure injuries - Accelerate wound healing - Reduce fall risk - Encourage healthy hydration - Accurate urine monitoring and sampling - No specialized training or nursing services required  At this time there are no assistance programs for the PureWick. Below is the pricing the patient would pay. - PW100- PureWickT Urine Collection System, with no battery- $299.00 PW200-  - PureWickT Urine Collection System, With lithium-ion battery-$399.00 - FX30- PureWickT Female External Catheter (box of 30, latex-free)- $195.00  - PWKIT03-PureWickT Urine Collection System Accessory kit- $49.99  If the patient would like to buy the PureWick TM they can call [phone].   Electronically signed by:  Donnita Falls, FNP   03/17/24    2:14 PM

## 2024-03-16 DIAGNOSIS — G309 Alzheimer's disease, unspecified: Secondary | ICD-10-CM | POA: Diagnosis not present

## 2024-03-16 DIAGNOSIS — R4182 Altered mental status, unspecified: Secondary | ICD-10-CM | POA: Diagnosis not present

## 2024-03-17 ENCOUNTER — Encounter: Payer: Self-pay | Admitting: Urology

## 2024-03-17 ENCOUNTER — Ambulatory Visit (INDEPENDENT_AMBULATORY_CARE_PROVIDER_SITE_OTHER): Admitting: Urology

## 2024-03-17 VITALS — BP 126/57 | HR 80

## 2024-03-17 DIAGNOSIS — N390 Urinary tract infection, site not specified: Secondary | ICD-10-CM | POA: Diagnosis not present

## 2024-03-17 DIAGNOSIS — F0280 Dementia in other diseases classified elsewhere without behavioral disturbance: Secondary | ICD-10-CM

## 2024-03-17 DIAGNOSIS — N952 Postmenopausal atrophic vaginitis: Secondary | ICD-10-CM

## 2024-03-17 DIAGNOSIS — Z87440 Personal history of urinary (tract) infections: Secondary | ICD-10-CM | POA: Diagnosis not present

## 2024-03-17 DIAGNOSIS — G309 Alzheimer's disease, unspecified: Secondary | ICD-10-CM | POA: Diagnosis not present

## 2024-03-17 DIAGNOSIS — R32 Unspecified urinary incontinence: Secondary | ICD-10-CM

## 2024-03-17 LAB — MICROSCOPIC EXAMINATION

## 2024-03-17 LAB — URINALYSIS, ROUTINE W REFLEX MICROSCOPIC
Bilirubin, UA: NEGATIVE
Glucose, UA: NEGATIVE
Nitrite, UA: NEGATIVE
Specific Gravity, UA: 1.025 (ref 1.005–1.030)
Urobilinogen, Ur: 1 mg/dL (ref 0.2–1.0)
pH, UA: 6 (ref 5.0–7.5)

## 2024-03-17 NOTE — Patient Instructions (Signed)
 Female External Catheters Appear to be two main products available: Bard/Liberator PureWick Female External Catheter Phone: [phone] [email or web address]  Potential Immediate benefits of PureWickT: - Eliminate pain associated with internal catheters - Decrease risk of urinary tract infection - Minimize moisture associated skin damage and pressure injuries - Accelerate wound healing - Reduce fall risk - Encourage healthy hydration - Accurate urine monitoring and sampling - No specialized training or nursing services required  At this time there are no assistance programs for the PureWick. Below is the pricing the patient would pay. - PW100- PureWickT Urine Collection System, with no battery- $299.00 PW200-  - PureWickT Urine Collection System, With lithium-ion battery-$399.00 - FX30- PureWickT Female External Catheter (box of 30, latex-free)- $195.00  - PWKIT03-PureWickT Urine Collection System Accessory kit- $49.99  If the patient would like to buy the PureWick TM they can call [phone].

## 2024-03-18 ENCOUNTER — Telehealth: Payer: Self-pay

## 2024-03-18 NOTE — Telephone Encounter (Signed)
 Patient's grandson left message about patient urinalysis and urine culture.  Ivin Booty state's he saw patient results for urinalysis  on mychart that was abnormal and he was concern about treatment for his grand mom Ivin Booty was made aware that a message will be sent to Maralyn Sago on advisement on how to treat patient. Ivin Booty  was also made aware that urine culture takes about 3-5 business days, Ivin Booty voiced understanding

## 2024-03-19 LAB — URINE CULTURE

## 2024-03-23 DIAGNOSIS — R4182 Altered mental status, unspecified: Secondary | ICD-10-CM | POA: Diagnosis not present

## 2024-03-23 DIAGNOSIS — G309 Alzheimer's disease, unspecified: Secondary | ICD-10-CM | POA: Diagnosis not present

## 2024-03-24 ENCOUNTER — Other Ambulatory Visit: Payer: Self-pay | Admitting: Urology

## 2024-03-24 ENCOUNTER — Telehealth: Payer: Self-pay

## 2024-03-24 DIAGNOSIS — R8279 Other abnormal findings on microbiological examination of urine: Secondary | ICD-10-CM

## 2024-03-24 MED ORDER — AMOXICILLIN-POT CLAVULANATE 875-125 MG PO TABS: 1.0000 | ORAL_TABLET | Freq: Two times a day (BID) | ORAL | 0 refills | Status: DC

## 2024-03-24 NOTE — Telephone Encounter (Signed)
-----   Message from Donnita Falls sent at 03/24/2024  2:08 PM EDT ----- Please let pt know urine culture result. I have sent prescription for Augmentin. Thanks.

## 2024-03-24 NOTE — Telephone Encounter (Signed)
 Grandson made aware of patients positive urine culture and abt sent to the pharmacy.

## 2024-03-27 ENCOUNTER — Emergency Department (HOSPITAL_COMMUNITY)
Admission: EM | Admit: 2024-03-27 | Discharge: 2024-03-27 | Disposition: A | Discharge status: Home / Self Care | Attending: Emergency Medicine | Admitting: Emergency Medicine

## 2024-03-27 ENCOUNTER — Encounter (HOSPITAL_COMMUNITY): Payer: Self-pay

## 2024-03-27 ENCOUNTER — Other Ambulatory Visit: Payer: Self-pay

## 2024-03-27 DIAGNOSIS — F0390 Unspecified dementia without behavioral disturbance: Secondary | ICD-10-CM | POA: Diagnosis not present

## 2024-03-27 DIAGNOSIS — I10 Essential (primary) hypertension: Secondary | ICD-10-CM | POA: Insufficient documentation

## 2024-03-27 DIAGNOSIS — Z7982 Long term (current) use of aspirin: Secondary | ICD-10-CM | POA: Insufficient documentation

## 2024-03-27 DIAGNOSIS — Z79899 Other long term (current) drug therapy: Secondary | ICD-10-CM | POA: Insufficient documentation

## 2024-03-27 DIAGNOSIS — R55 Syncope and collapse: Secondary | ICD-10-CM

## 2024-03-27 DIAGNOSIS — I951 Orthostatic hypotension: Secondary | ICD-10-CM | POA: Diagnosis not present

## 2024-03-27 DIAGNOSIS — R531 Weakness: Secondary | ICD-10-CM | POA: Diagnosis not present

## 2024-03-27 LAB — CBC
HCT: 36 % (ref 36.0–46.0)
Hemoglobin: 11.6 g/dL — ABNORMAL LOW (ref 12.0–15.0)
MCH: 30.7 pg (ref 26.0–34.0)
MCHC: 32.2 g/dL (ref 30.0–36.0)
MCV: 95.2 fL (ref 80.0–100.0)
Platelets: 219 10*3/uL (ref 150–400)
RBC: 3.78 MIL/uL — ABNORMAL LOW (ref 3.87–5.11)
RDW: 13.1 % (ref 11.5–15.5)
WBC: 7.5 10*3/uL (ref 4.0–10.5)
nRBC: 0 % (ref 0.0–0.2)

## 2024-03-27 LAB — BASIC METABOLIC PANEL WITH GFR
Anion gap: 8 (ref 5–15)
BUN: 27 mg/dL — ABNORMAL HIGH (ref 8–23)
CO2: 26 mmol/L (ref 22–32)
Calcium: 9.6 mg/dL (ref 8.9–10.3)
Chloride: 103 mmol/L (ref 98–111)
Creatinine, Ser: 1.07 mg/dL — ABNORMAL HIGH (ref 0.44–1.00)
GFR, Estimated: 51 mL/min — ABNORMAL LOW (ref >60)
Glucose, Bld: 99 mg/dL (ref 70–99)
Potassium: 4.5 mmol/L (ref 3.5–5.1)
Sodium: 137 mmol/L (ref 135–145)

## 2024-03-27 LAB — CBG MONITORING, ED: Glucose-Capillary: 116 mg/dL — ABNORMAL HIGH (ref 70–99)

## 2024-03-27 MED ORDER — SODIUM CHLORIDE 0.9 % IV BOLUS
1000.0000 mL | Freq: Once | INTRAVENOUS | Status: AC
  Administered 2024-03-27: 1000 mL via INTRAVENOUS

## 2024-03-27 NOTE — ED Provider Notes (Signed)
 [facility] EMERGENCY DEPARTMENT AT [facility] Provider Note   CSN: [id number] Arrival date & time: 03/27/24  1211     History  Chief Complaint  Patient presents with   Near Syncope   Hypotension    Alison Brewer is a 89 y.o. female.  Patient is an 89 year old female with a past medical history of dementia hypertension and frequent UTIs and recently started on antibiotic for UTI presenting to the emergency department with near syncope.  Per EMS, they were called for near syncopal event today.  They state that the patient's blood pressure was in the 80s on their arrival and she received a 500 cc bolus of fluids and route with improvement of her blood pressure.  The patient does not remember the episode today and is unable to provide any additional history.  She was recently started on Augmentin for UTI 3 days ago.  The history is provided by the EMS personnel. The history is limited by the condition of the patient (Level 5 caveat for dementia).  Near Syncope       Home Medications Prior to Admission medications   Medication Sig Start Date End Date Taking? Authorizing Provider  albuterol (VENTOLIN HFA) 108 (90 Base) MCG/ACT inhaler Inhale 1-2 puffs into the lungs every 6 (six) hours as needed for wheezing or shortness of breath. 08/12/22   Particia Nearing, PA-C  alendronate (FOSAMAX) 70 MG tablet TAKE ONE TABLET BY MOUTH EVERY SEVEN DAYS WITH A FULL GLASS OF WATER ON AN EMPTY STOMACH 01/10/24   Sharon Seller, NP  amLODipine (NORVASC) 2.5 MG tablet Take 1 tablet (2.5 mg total) by mouth daily. Hold if blood pressure less than 130 systolic 02/27/24 03/26/80  Lanae Boast, MD  amoxicillin-clavulanate (AUGMENTIN) 875-125 MG tablet Take 1 tablet by mouth every 12 (twelve) hours. 03/24/24   Donnita Falls, FNP  aspirin EC 81 MG tablet Take 81 mg by mouth daily. Swallow whole.    [provider]  Calcium Carb-Cholecalciferol (CALCIUM 600+D3 PO) Take 1 tablet by  mouth 2 (two) times daily.    [provider]  ciclopirox (PENLAC) 8 % solution APPLY TOPICALLY AT BEDTIME. APPLY OVER NAIL AND SURROUNDING SKIN. APPLY DAILY OVER PREVIOUS COAT. AFTER SEVEN (7) DAYS, MAY REMOVE WITH ALCOHOL AND CONTINUE CYCLE. 01/09/24   Willeen Niece, MD  docusate sodium (COLACE) 100 MG capsule Take 1 capsule (100 mg total) by mouth 2 (two) times daily. 03/10/24   Medina-Vargas, Monina C, NP  ESTRING 7.5 MCG/24HR vaginal ring Place 1 each vaginally every 3 (three) months. 07/20/23   [provider]  memantine (NAMENDA) 10 MG tablet Take 1 tablet (10 mg total) by mouth 2 (two) times daily. 06/22/23   Marcos Eke, PA-C  Multiple Vitamin (MULTIVITAMIN) tablet Take 1 tablet by mouth daily.    [provider]  sodium chloride (MURO 128) 2 % ophthalmic solution Place 1 drop into both eyes daily.    [provider]      Allergies    Patient has no known allergies.    Review of Systems   Review of Systems  Cardiovascular:  Positive for near-syncope.    Physical Exam Updated Vital Signs BP (!) 167/79   Pulse 64   Temp 97.8 F (36.6 C) (Oral)   Resp 12   Ht 5\' 5"  (1.651 m)   Wt 57 kg   SpO2 100%   BMI 20.91 kg/m  Physical Exam Vitals and nursing note reviewed.  Constitutional:  General: She is not in acute distress.    Appearance: Normal appearance.  HENT:     Head: Normocephalic and atraumatic.     Nose: Nose normal.     Mouth/Throat:     Mouth: Mucous membranes are moist.     Pharynx: Oropharynx is clear.  Eyes:     Extraocular Movements: Extraocular movements intact.     Conjunctiva/sclera: Conjunctivae normal.     Pupils: Pupils are equal, round, and reactive to light.  Cardiovascular:     Rate and Rhythm: Normal rate and regular rhythm.     Heart sounds: Normal heart sounds.  Pulmonary:     Effort: Pulmonary effort is normal.     Breath sounds: Normal breath sounds.  Abdominal:     General: Abdomen is flat.      Palpations: Abdomen is soft.     Tenderness: There is no abdominal tenderness.  Musculoskeletal:        General: Normal range of motion.     Cervical back: Normal range of motion.  Skin:    General: Skin is warm and dry.  Neurological:     General: No focal deficit present.     Mental Status: She is alert. Mental status is at baseline.     Sensory: No sensory deficit.     Motor: No weakness.  Psychiatric:        Mood and Affect: Mood normal.        Behavior: Behavior normal.     ED Results / Procedures / Treatments   Labs (all labs ordered are listed, but only abnormal results are displayed) Labs Reviewed  BASIC METABOLIC PANEL WITH GFR - Abnormal; Notable for the following components:      Result Value   BUN 27 (*)    Creatinine, Ser 1.07 (*)    GFR, Estimated 51 (*)    All other components within normal limits  CBC - Abnormal; Notable for the following components:   RBC 3.78 (*)    Hemoglobin 11.6 (*)    All other components within normal limits  CBG MONITORING, ED - Abnormal; Notable for the following components:   Glucose-Capillary 116 (*)    All other components within normal limits  URINALYSIS, ROUTINE W REFLEX MICROSCOPIC    EKG EKG Interpretation Date/Time:  Thursday March 27 2024 12:26:53 EDT Ventricular Rate:  65 PR Interval:  155 QRS Duration:  108 QT Interval:  482 QTC Calculation: 502 R Axis:   -68  Text Interpretation: Sinus rhythm RSR' in V1 or V2, probably normal variant LVH with secondary repolarization abnormality Inferior infarct, old Anterolateral infarct, age indeterminate Prolonged QT interval Artifact in lead(s) I II III aVR aVL aVF V1 V2 V3 V4 V5 V6 Otherwise no significant change Confirmed by Elayne Snare ([id number]) on 03/27/2024 1:17:58 PM  Radiology No results found.  Procedures Procedures    Medications Ordered in ED Medications  sodium chloride 0.9 % bolus 1,000 mL (1,000 mLs Intravenous New Bag/Given 03/27/24 1507)    ED  Course/ Medical Decision Making/ A&P Clinical Course as of 03/27/24 1539  Thu Mar 27, 2024  1435 Orthostatics positive, IVF bolus ordered. [VK]  1435 RN spoke with patient's grandson who states he got her up to go to the bathroom today when she became very lightheaded and almost passed out when he called 911. [VK]  1526 Labs within normal range. Patient currently on antibiotics for UTI and no reports of worsening symptoms and do not need to repeat UA  today. Will be given compression stockings. She is otherwise stable for discharge after fluid bolus complete. [VK]    Clinical Course User Index [VK] Rexford Maus, DO                                 Medical Decision Making This patient presents to the ED with chief complaint(s) of near syncope with pertinent past medical history of dementia, HTN, frequent UTIs which further complicates the presenting complaint. The complaint involves an extensive differential diagnosis and also carries with it a high risk of complications and morbidity.    The differential diagnosis includes arrhythmia, anemia, dehydration, electrolyte abnormality, orthostatic hypotension, infection  Additional history obtained: Additional history obtained from EMS  Records reviewed Primary Care Documents and outpatient urology records  ED Course and Reassessment: On arrival she is hemodynamically stable in no acute distress at her neurologic baseline with no current complaints.  EKG on arrival showed normal sinus rhythm without acute ischemic changes and glucose on arrival is normal.  Independent labs interpretation:  The following labs were independently interpreted: within normal range  Independent visualization of imaging: - n/A  Consultation: - Consulted or discussed management/test interpretation w/ external professional: N/A  Consideration for admission or further workup: Patient has no emergent conditions requiring admission or further work-up at this  time and is stable for discharge home with primary care follow-up  Social Determinants of health: N/A    Amount and/or Complexity of Data Reviewed Labs: ordered.          Final Clinical Impression(s) / ED Diagnoses Final diagnoses:  Orthostatic hypotension  Near syncope    Rx / DC Orders ED Discharge Orders          Ordered    Compression stockings        03/27/24 1528              Rexford Maus, DO 03/27/24 1539

## 2024-03-27 NOTE — ED Notes (Signed)
Pt up to [facility] for urine sample.

## 2024-03-27 NOTE — ED Triage Notes (Signed)
 Pt BIBA from home. Pt had near syncopal episode, on EMS arrival BP was 80/50. Was given 500 NS, and BP up to 140/60.  Pt is alert, but has dementia and can not answer most orientation questions. At her baseline

## 2024-03-27 NOTE — ED Notes (Signed)
 Dr. Theresia Lo notified of pt orthostatic vitals results

## 2024-03-27 NOTE — Discharge Instructions (Signed)
 You were seen in the emergency department for your episode of nearly passing out.  Your blood pressure did drop when you went from laying to sitting to standing consistent with orthostatic hypotension.  The remainder of her labs are normal.  I have given you prescription of compression stockings you should wear these whenever you are up and standing.  Take your time when going from sitting to standing to prevent your blood pressure from dropping and having worsening dizziness.  You can follow-up with your primary doctor in the next few days to have your symptoms rechecked.  You should return to the emergency department if you have worsening dizziness that caused you to fall or injure himself, you pass out, have severe chest pain or have any other new or concerning symptoms.

## 2024-03-28 ENCOUNTER — Telehealth: Payer: Self-pay

## 2024-03-28 NOTE — Telephone Encounter (Signed)
 I called back and no answer. Detailed voicemail left with verbal orders given.

## 2024-03-28 NOTE — Telephone Encounter (Signed)
 Copied from CRM [id number]. Topic: Clinical - Home Health Verbal Orders >> Mar 28, 2024  2:17 PM Claudine Mouton wrote: Caller/Agency: [facility]   Callback Number: [phone] - secure line.   Service Requested: Physical Therapy  Frequency: Alison Brewer would like to extend Physical  Therapy starting next week to 1x4.   Any new concerns about the patient? Yes  Patient was taken to the emergency room yesterday due to hypertension. Today's vitals are good.  BP: 112/60 HR 70 Temp 97.3. Patient started antibiotics Monday for bladder infection.

## 2024-03-28 NOTE — Telephone Encounter (Signed)
Message routed to PCP Eubanks, Jessica K, NP  

## 2024-03-28 NOTE — Telephone Encounter (Signed)
Okay to approve.

## 2024-03-30 DIAGNOSIS — R4182 Altered mental status, unspecified: Secondary | ICD-10-CM | POA: Diagnosis not present

## 2024-03-30 DIAGNOSIS — G309 Alzheimer's disease, unspecified: Secondary | ICD-10-CM | POA: Diagnosis not present

## 2024-04-03 ENCOUNTER — Ambulatory Visit: Payer: Self-pay

## 2024-04-03 NOTE — Telephone Encounter (Signed)
 Chief Complaint: Low BP reading Symptoms: None at this time Frequency: onset today Pertinent Negatives: Patient denies other symptoms Disposition: [x] ED /[] Urgent Care (no appt availability in office) / [] Appointment(In office/virtual)/ []  [facility]/ [] Home Care/ [] Refused Recommended Disposition /[]  Mobile Bus/ []  Follow-up with PCP Additional Notes: Alison Brewer, PTA with [facility] called and said patient's BP in left arm 81/46, Right arm 85/51, P 69-70. She says she hasn't gotten the patient up yet for PT due to the low BP and with the patient's cognition she's not able to tell if she's having other symptoms. Alison Brewer says the caregiver reports this morning BP was 90/50 and yesterday 105/53. Alison Brewer to let the caregiver know the patient needs to be evaluated in the ED for the low BP, she says she will let them know. She's still there with the patient.   Copied from CRM [id number]. Topic: Clinical - Red Word Triage >> Apr 03, 2024 11:39 AM Alison Brewer wrote: [facility] that prompted transfer to Nurse Triage: Alison Brewer with [facility] is requesting to speak with Brewer nurse regarding pt blood pressure being too low. Reason for Disposition  Patient sounds very sick or weak to the triager  Answer Assessment - Initial Assessment Questions 1. BLOOD PRESSURE: "What is the blood pressure?" "Did you take at least two measurements 5 minutes apart?"     BP left arm 81/46, Right arm 85/51 2. ONSET: "When did you take your blood pressure?"     This morning 3. HOW: "How did you obtain the blood pressure?" (e.g., visiting nurse, automatic home BP monitor)     Visiting PTA 4. HISTORY: "Do you have Brewer history of low blood pressure?" "What is your blood pressure normally?"     Yes, normally higher 5. MEDICINES: "Are you taking any medications for blood pressure?" If Yes, ask: "Have they been changed recently?"     Amlodipine 5 mg once Brewer day for SPB over 130 6. PULSE RATE: "Do you  know what your pulse rate is?"      69-70 7. OTHER SYMPTOMS: "Have you been sick recently?" "Have you had Brewer recent injury?"     Treating for UTI  Protocols used: Blood Pressure - Low-Brewer-AH

## 2024-04-03 NOTE — Telephone Encounter (Signed)
Message routed to PCP Eubanks, Jessica K, NP  

## 2024-04-03 NOTE — Telephone Encounter (Signed)
Okay thank you for the update.

## 2024-04-05 ENCOUNTER — Other Ambulatory Visit: Payer: Self-pay

## 2024-04-05 ENCOUNTER — Emergency Department (HOSPITAL_COMMUNITY)
Admission: EM | Admit: 2024-04-05 | Discharge: 2024-04-05 | Disposition: A | Discharge status: Home / Self Care | Attending: Emergency Medicine | Admitting: Emergency Medicine

## 2024-04-05 DIAGNOSIS — E860 Dehydration: Secondary | ICD-10-CM | POA: Diagnosis not present

## 2024-04-05 DIAGNOSIS — Z79899 Other long term (current) drug therapy: Secondary | ICD-10-CM | POA: Diagnosis not present

## 2024-04-05 DIAGNOSIS — R739 Hyperglycemia, unspecified: Secondary | ICD-10-CM | POA: Diagnosis not present

## 2024-04-05 DIAGNOSIS — I129 Hypertensive chronic kidney disease with stage 1 through stage 4 chronic kidney disease, or unspecified chronic kidney disease: Secondary | ICD-10-CM | POA: Insufficient documentation

## 2024-04-05 DIAGNOSIS — R55 Syncope and collapse: Secondary | ICD-10-CM | POA: Diagnosis not present

## 2024-04-05 DIAGNOSIS — I110 Hypertensive heart disease with heart failure: Secondary | ICD-10-CM | POA: Insufficient documentation

## 2024-04-05 DIAGNOSIS — I10 Essential (primary) hypertension: Secondary | ICD-10-CM | POA: Diagnosis not present

## 2024-04-05 DIAGNOSIS — D649 Anemia, unspecified: Secondary | ICD-10-CM | POA: Insufficient documentation

## 2024-04-05 DIAGNOSIS — N189 Chronic kidney disease, unspecified: Secondary | ICD-10-CM | POA: Diagnosis not present

## 2024-04-05 DIAGNOSIS — F0390 Unspecified dementia without behavioral disturbance: Secondary | ICD-10-CM | POA: Diagnosis not present

## 2024-04-05 DIAGNOSIS — I959 Hypotension, unspecified: Secondary | ICD-10-CM | POA: Diagnosis not present

## 2024-04-05 DIAGNOSIS — I509 Heart failure, unspecified: Secondary | ICD-10-CM | POA: Diagnosis not present

## 2024-04-05 DIAGNOSIS — Z7982 Long term (current) use of aspirin: Secondary | ICD-10-CM | POA: Diagnosis not present

## 2024-04-05 LAB — COMPREHENSIVE METABOLIC PANEL WITH GFR
ALT: 18 U/L (ref 0–44)
AST: 34 U/L (ref 15–41)
Albumin: 3.3 g/dL — ABNORMAL LOW (ref 3.5–5.0)
Alkaline Phosphatase: 119 U/L (ref 38–126)
Anion gap: 9 (ref 5–15)
BUN: 27 mg/dL — ABNORMAL HIGH (ref 8–23)
CO2: 25 mmol/L (ref 22–32)
Calcium: 9.4 mg/dL (ref 8.9–10.3)
Chloride: 104 mmol/L (ref 98–111)
Creatinine, Ser: 1.05 mg/dL — ABNORMAL HIGH (ref 0.44–1.00)
GFR, Estimated: 52 mL/min — ABNORMAL LOW (ref >60)
Glucose, Bld: 119 mg/dL — ABNORMAL HIGH (ref 70–99)
Potassium: 4.7 mmol/L (ref 3.5–5.1)
Sodium: 138 mmol/L (ref 135–145)
Total Bilirubin: 0.7 mg/dL (ref 0.0–1.2)
Total Protein: 5.9 g/dL — ABNORMAL LOW (ref 6.5–8.1)

## 2024-04-05 LAB — CBC WITH DIFFERENTIAL/PLATELET
Abs Immature Granulocytes: 0.04 10*3/uL (ref 0.00–0.07)
Basophils Absolute: 0 10*3/uL (ref 0.0–0.1)
Basophils Relative: 0 %
Eosinophils Absolute: 0.1 10*3/uL (ref 0.0–0.5)
Eosinophils Relative: 1 %
HCT: 35.4 % — ABNORMAL LOW (ref 36.0–46.0)
Hemoglobin: 11.1 g/dL — ABNORMAL LOW (ref 12.0–15.0)
Immature Granulocytes: 0 %
Lymphocytes Relative: 13 %
Lymphs Abs: 1.2 10*3/uL (ref 0.7–4.0)
MCH: 30.5 pg (ref 26.0–34.0)
MCHC: 31.4 g/dL (ref 30.0–36.0)
MCV: 97.3 fL (ref 80.0–100.0)
Monocytes Absolute: 0.2 10*3/uL (ref 0.1–1.0)
Monocytes Relative: 3 %
Neutro Abs: 7.4 10*3/uL (ref 1.7–7.7)
Neutrophils Relative %: 83 %
Platelets: 210 10*3/uL (ref 150–400)
RBC: 3.64 MIL/uL — ABNORMAL LOW (ref 3.87–5.11)
RDW: 13.7 % (ref 11.5–15.5)
WBC: 8.9 10*3/uL (ref 4.0–10.5)
nRBC: 0 % (ref 0.0–0.2)

## 2024-04-05 LAB — URINALYSIS, W/ REFLEX TO CULTURE (INFECTION SUSPECTED)
Bacteria, UA: NONE SEEN
Bilirubin Urine: NEGATIVE
Glucose, UA: NEGATIVE mg/dL
Hgb urine dipstick: NEGATIVE
Ketones, ur: NEGATIVE mg/dL
Leukocytes,Ua: NEGATIVE
Nitrite: NEGATIVE
Protein, ur: NEGATIVE mg/dL
Specific Gravity, Urine: 1.021 (ref 1.005–1.030)
pH: 5 (ref 5.0–8.0)

## 2024-04-05 LAB — CBG MONITORING, ED: Glucose-Capillary: 127 mg/dL — ABNORMAL HIGH (ref 70–99)

## 2024-04-05 NOTE — Discharge Instructions (Signed)
 It was a pleasure caring for you today. Workup today was reassuring. Please follow up with your primary care provider. Seek emergency care if experiencing any new or worsening symptoms

## 2024-04-05 NOTE — ED Provider Notes (Signed)
 I provided a substantive portion of the care of this patient.  I personally made/approved the management plan for this patient and take responsibility for the patient management.  EKG Interpretation Date/Time:  Saturday April 05 2024 17:20:56 EDT Ventricular Rate:  71 PR Interval:  166 QRS Duration:  110 QT Interval:  445 QTC Calculation: 484 R Axis:   -66  Text Interpretation: Sinus rhythm LVH with secondary repolarization abnormality Inferior infarct, old Probable anterior infarct, age indeterminate no sig change from previous Confirmed by Wynetta Heckle [id number]) on 04/05/2024 5:23:43 PM   Has history of dementia at baseline.  She is living home with her grandson.  If she had an episode of dizziness or nearly passing out with position change.  At this time patient has no complaints.  She is cheerful and alert.  She denies any pain.  Patient is alert and interactive.  She has cognitive impairment with lack of recall.  It is regular.  Lungs grossly clear.  Abdomen soft.  Lower extremities very good condition without any wounds.  No peripheral edema.  Feet are warm and dry without wounds.  At this time patient had a liter of fluids from EMS.  Blood pressures have been stable here.  Analysis shows no signs of infection.  GFR is at baseline.  White count 8.9.Aaron Aas  At This time no signs of infection.  Patient has been hydrated.  Stable appearance.   Wynetta Heckle, MD 04/05/24 1946

## 2024-04-05 NOTE — ED Triage Notes (Addendum)
 Pt presents to the ED from home with complaints of weakness and dizziness as her grandson stood her up from the toilet. Pts grandson states she had a near syncopal episode. Hx of UTI which she just finished her antibiotics, hx of dementia pt currently at baseline. [facility] administered 1L of LR en route.   BP: 100/60 standing 70/60 HR 65

## 2024-04-05 NOTE — ED Notes (Signed)
 Pt's family provided discharge instructions and prescription information. Pt and family was given the opportunity to ask questions and questions were answered.

## 2024-04-05 NOTE — ED Provider Notes (Signed)
 [facility] EM[facility] AT [facility] Provider Note   CSN: [id number] Arrival date & time: 04/05/24  1607     History  Chief Complaint  Patient presents with   Hypotension    Alison Brewer is a 89 y.o. female with PMHx OA, headaches, HTN, HLD, dementia, recurrent UTI, CKD, CHF who presents to ED after pre-syncopal episode. Patient is pleasantly demented at baseline, so patient's grandson, Alison Brewer, was called to provide most of the history.   Alison Brewer stating that patient does not drink enough fluids which has been a chronic problem for her and patient has had multiple ED visits recently d/t this. Alison Brewer has been moving/exercising patient's legs before standing to prevent vaso-vagal syncope, but patient had increased weakness today and ended up with lightheadedness and near-syncope while standing up at home. Alison Brewer gave patient juice in hopes that she would drink enough to feel better, but patient was still very lightheaded and weak with walking so [place] called 911.  Alison Brewer stating that patient finished ABX this Monday for UTI. Patient has PCP appointment next Tuesday to discuss possible opportunities to obtain IV fluid doses at home.   HPI     Home Medications Prior to Admission medications   Medication Sig Start Date End Date Taking? Authorizing Provider  albuterol  (VENTOLIN  HFA) 108 (90 Base) MCG/ACT inhaler Inhale 1-2 puffs into the lungs every 6 (six) hours as needed for wheezing or shortness of breath. 08/12/22   Corbin Dess, PA-C  alendronate  (FOSAMAX ) 70 MG tablet TAKE ONE TABLET BY MOUTH EVERY SEVEN DAYS WITH A FULL GLASS OF WATER ON AN EMPTY STOMACH 01/10/24   Verma Gobble, NP  amLODipine  (NORVASC ) 2.5 MG tablet Take 1 tablet (2.5 mg total) by mouth daily. Hold if blood pressure less than 130 systolic 02/27/24 4/69/62  Lesa Rape, MD  amoxicillin -clavulanate (AUGMENTIN ) 875-125 MG tablet Take 1 tablet by mouth every 12 (twelve) hours. 03/24/24    Lauretta Ponto, FNP  aspirin EC 81 MG tablet Take 81 mg by mouth daily. Swallow whole.    [provider]  Calcium Carb-Cholecalciferol (CALCIUM 600+D3 PO) Take 1 tablet by mouth 2 (two) times daily.    [provider]  ciclopirox  (PENLAC ) 8 % solution APPLY TOPICALLY AT BEDTIME. APPLY OVER NAIL AND SURROUNDING SKIN. APPLY DAILY OVER PREVIOUS COAT. AFTER SEVEN (7) DAYS, MAY REMOVE WITH ALCOHOL AND CONTINUE CYCLE. 01/09/24   Artemio Larry, MD  docusate sodium  (COLACE) 100 MG capsule Take 1 capsule (100 mg total) by mouth 2 (two) times daily. 03/10/24   Medina-Vargas, Monina C, NP  ESTRING  7.5 MCG/24HR vaginal ring Place 1 each vaginally every 3 (three) months. 07/20/23   [provider]  memantine  (NAMENDA ) 10 MG tablet Take 1 tablet (10 mg total) by mouth 2 (two) times daily. 06/22/23   Wertman, Sara E, PA-C  Multiple Vitamin (MULTIVITAMIN) tablet Take 1 tablet by mouth daily.    [provider]  sodium chloride  (MURO 128) 2 % ophthalmic solution Place 1 drop into both eyes daily.    [provider]      Allergies    Patient has no known allergies.    Review of Systems   Review of Systems  Neurological:        Pre-syncope    Physical Exam Updated Vital Signs BP (!) 169/69 (BP Location: Right Arm)   Pulse 79   Temp 98.5 F (36.9 C) (Oral)   Resp 17   SpO2 98%  Physical  Exam Vitals and nursing note reviewed.  Constitutional:      General: She is not in acute distress.    Appearance: She is not ill-appearing or toxic-appearing.  HENT:     Head: Normocephalic and atraumatic.     Mouth/Throat:     Mouth: Mucous membranes are moist.  Eyes:     General: No scleral icterus.       Right eye: No discharge.        Left eye: No discharge.     Conjunctiva/sclera: Conjunctivae normal.  Cardiovascular:     Rate and Rhythm: Normal rate and regular rhythm.     Pulses: Normal pulses.     Heart sounds: Normal heart sounds. No murmur  heard. Pulmonary:     Effort: Pulmonary effort is normal. No respiratory distress.     Breath sounds: Normal breath sounds. No wheezing, rhonchi or rales.  Abdominal:     General: Abdomen is flat. Bowel sounds are normal. There is no distension.     Palpations: Abdomen is soft. There is no mass.     Tenderness: There is no abdominal tenderness.  Musculoskeletal:     Right lower leg: No edema.     Left lower leg: No edema.  Skin:    General: Skin is warm and dry.     Findings: No rash.  Neurological:     General: No focal deficit present.     Mental Status: She is alert. Mental status is at baseline.     Comments: GCS 15. Speech is goal oriented. No deficits appreciated to CN III-XII; symmetric eyebrow raise, no facial drooping, tongue midline. Patient has equal grip strength bilaterally with 5/5 strength against resistance in all major muscle groups bilaterally. Sensation to light touch intact. Patient moves extremities without ataxia.  AAOx2. Pleasantly demented at baseline.   Psychiatric:        Mood and Affect: Mood normal.     ED Results / Procedures / Treatments   Labs (all labs ordered are listed, but only abnormal results are displayed) Labs Reviewed  CBC WITH DIFFERENTIAL/PLATELET - Abnormal; Notable for the following components:      Result Value   RBC 3.64 (*)    Hemoglobin 11.1 (*)    HCT 35.4 (*)    All other components within normal limits  COMPREHENSIVE METABOLIC PANEL WITH GFR - Abnormal; Notable for the following components:   Glucose, Bld 119 (*)    BUN 27 (*)    Creatinine, Ser 1.05 (*)    Total Protein 5.9 (*)    Albumin 3.3 (*)    GFR, Estimated 52 (*)    All other components within normal limits  URINALYSIS, W/ REFLEX TO CULTURE (INFECTION SUSPECTED) - Abnormal; Notable for the following components:   Color, Urine AMBER (*)    APPearance HAZY (*)    All other components within normal limits  CBG MONITORING, ED - Abnormal; Notable for the following  components:   Glucose-Capillary 127 (*)    All other components within normal limits    EKG EKG Interpretation Date/Time:  Saturday April 05 2024 17:20:56 EDT Ventricular Rate:  71 PR Interval:  166 QRS Duration:  110 QT Interval:  445 QTC Calculation: 484 R Axis:   -66  Text Interpretation: Sinus rhythm LVH with secondary repolarization abnormality Inferior infarct, old Probable anterior infarct, age indeterminate no sig change from previous Confirmed by Wynetta Heckle [phone]) on 04/05/2024 5:23:43 PM  Radiology No results found.  Procedures Procedures  Medications Ordered in ED Medications - No data to display  ED Course/ Medical Decision Making/ A&P                                 Medical Decision Making Amount and/or Complexity of Data Reviewed Labs: ordered.   This patient presents to the ED for concern of syncope, this involves an extensive number of treatment options, and is a complaint that carries with it a high risk of complications and morbidity.  The differential diagnosis includes CVA, ICH, intracranial mass, critical dehydration, endocrine abnormality, sepsis/infection, electrolyte abnormality, cardiac arrhythmia.   Co morbidities that complicate the patient evaluation  OA, headaches, HTN, HLD, dementia, recurrent UTI, CKD    Additional history obtained:  Additional history obtained from 03/27/2024 ED note: patient seen for near-syncope. Hypotensive which resolved after IV fluid bolus.   Problem List / ED Course / Critical interventions / Medication management  Patient presents to ED concern for near syncope.  Patient's grandson providing most of history as patient is pleasantly demented at baseline and denying any symptoms today.  Grandson stated that patient has not been tolerating PO fluid intake recently.  Patient was lightheaded when standing, so grandson provided her with juice but patient was not able to drink enough and continued to be  lightheaded with standing.  EMS was called and noted patient to be hypotensive at 70/60 upon standing. This resolved with 1L IV fluids given by EMS.  I am hesitant to provide patient with further IV fluid bolus given hx of CHF and 25-30% EF on last ECHO. Patient resting comfortably in bed.  Physical exam reassuring.  Patient afebrile with stable vitals. I Ordered, and personally interpreted labs.  CMP with mild elevation in BUN/Cr at 27/1.05.  CBC with mild anemia with hemoglobin 11.1.  No leukocytosis.  CBG 127.  UA not concerning for infection. The patient was maintained on a cardiac monitor.  I personally viewed and interpreted the EKG/cardiac monitored which showed an underlying rhythm of: no acute changes from prior. Patient stating that she is feeling good after the fluid bolus. Shared all results with patient and grandson. Recommended following up with PCP appointment on Tuesday.  Staffed with Dr. Daivd Dub who agrees with plan. I have reviewed the patients home medicines and have made adjustments as needed The patient has been appropriately medically screened and/or stabilized in the ED. I have low suspicion for any other emergent medical condition which would require further screening, evaluation or treatment in the ED or require inpatient management. At time of discharge the patient is hemodynamically stable and in no acute distress. I have discussed work-up results and diagnosis with patient and answered all questions. Patient is agreeable with discharge plan. We discussed strict return precautions for returning to the emergency department and they verbalized understanding.    Ddx: these are considered less likely due to history of present illness and physical exam -CVA/ICH/intracranial mass: patient without neurodeficits; no history of seizure -Critical dehydration: BMP/CMP without concern -Sepsis/infection: SIRs criteria not met; patient afebrile without infectious symptoms -Electrolyte  abnormality: BMP/CMP without concern  -Cardiac arrhythmia: EKG shows NSR without acute ST changes   Social Determinants of Health:  Geriatric, dementia          Final Clinical Impression(s) / ED Diagnoses Final diagnoses:  Near syncope  Dehydration    Rx / DC Orders ED Discharge Orders     None  [employer] [place], [place] 04/05/24 1951    Wynetta Heckle, MD 04/06/24 [phone]

## 2024-04-06 DIAGNOSIS — G309 Alzheimer's disease, unspecified: Secondary | ICD-10-CM | POA: Diagnosis not present

## 2024-04-06 DIAGNOSIS — R4182 Altered mental status, unspecified: Secondary | ICD-10-CM | POA: Diagnosis not present

## 2024-04-08 ENCOUNTER — Encounter: Payer: Self-pay | Admitting: Family

## 2024-04-08 ENCOUNTER — Ambulatory Visit (INDEPENDENT_AMBULATORY_CARE_PROVIDER_SITE_OTHER): Admitting: Family

## 2024-04-08 VITALS — BP 118/60 | HR 60 | Temp 97.7°F | Resp 16 | Ht 65.0 in | Wt 128.0 lb

## 2024-04-08 DIAGNOSIS — F0280 Dementia in other diseases classified elsewhere without behavioral disturbance: Secondary | ICD-10-CM | POA: Diagnosis not present

## 2024-04-08 DIAGNOSIS — I10 Essential (primary) hypertension: Secondary | ICD-10-CM | POA: Diagnosis not present

## 2024-04-08 DIAGNOSIS — N1831 Chronic kidney disease, stage 3a: Secondary | ICD-10-CM

## 2024-04-08 DIAGNOSIS — R5381 Other malaise: Secondary | ICD-10-CM | POA: Diagnosis not present

## 2024-04-08 DIAGNOSIS — R32 Unspecified urinary incontinence: Secondary | ICD-10-CM

## 2024-04-08 DIAGNOSIS — G309 Alzheimer's disease, unspecified: Secondary | ICD-10-CM | POA: Diagnosis not present

## 2024-04-08 DIAGNOSIS — I5032 Chronic diastolic (congestive) heart failure: Secondary | ICD-10-CM | POA: Diagnosis not present

## 2024-04-08 NOTE — Progress Notes (Signed)
 Provider: Duell Holdren FNP-C  Verma Gobble, NP  Patient Care Team: Verma Gobble, NP as PCP - General (Geriatric Medicine) Lucendia Rusk, MD as PCP - Cardiology (Cardiology)  Extended Emergency Contact Information Primary Emergency Contact: Ellingson,joshua Home Phone: [phone] Mobile Phone: [phone] Relation: Grandson Secondary Emergency Contact: [facility] Home Phone: [phone] Mobile Phone: [phone] Relation: Relative  Code Status:  Full Code  Goals of care: Advanced Directive information    04/08/2024    1:47 PM  Advanced Directives  Does Patient Have a Medical Advance Directive? Yes  Type of Advance Directive Healthcare Power of Attorney  Does patient want to make changes to medical advance directive? No - Patient declined  Copy of Healthcare Power of Attorney in Chart? Yes - validated most recent copy scanned in chart (See row information)  Would patient like information on creating a medical advance directive? No - Patient declined     Chief Complaint  Patient presents with   [facility] Of [facility] follow up.    Discussed the use of AI scribe software for clinical note transcription with the patient, who gave verbal consent to proceed.  History of Present Illness   Alison Brewer is an 89 year old female with dementia and chronic kidney disease who presents for follow-up after recent hospitalizations for syncope and hypotension.  She has experienced three hospital visits in the past month, with the most recent being on April 19th, due to episodes of syncope and hypotension. During these episodes, she received IV fluids in the ambulance, which improved her condition by the time she arrived at the hospital. Her caregiver reports inadequate fluid intake, consuming only about 30 ounces daily instead of the recommended 64 ounces, leading to dehydration and subsequent syncope.  Her dementia is progressing rapidly, affecting  her mobility, ability to use the bathroom, and overall daily functioning. She requires assistance with walking and standing, and there have been instances of incontinence. The dementia also impacts her ability to remember names and recognize people, which varies depending on the time of day.  She has a history of osteoarthritis, headaches, hypertension, hyperlipidemia, recurrent urinary tract infections, and chronic kidney disease. Recent lab work showed a BUN of 27 and creatinine of 1.05, indicating some dehydration. Her white blood cell count was normal, and her blood sugar was 127. Hemoglobin was 11.1, ruling out anemia as a cause for her syncope.  She is currently on medications for her various conditions, including an ointment for toenail fungus, which she has run out of. She is also taking stool softeners to manage constipation, which has been an issue, especially following her recent hospital discharge.  Her caregiver reports that her appetite is generally good, although there was a day when she did not eat much, which coincided with one of her syncope episodes. Her weight has increased slightly from 126 to 128 pounds, and the caregiver aims to maintain her weight between 125 and 130 pounds.    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   alendronate  (FOSAMAX ) 70 MG tablet TAKE ONE TABLET BY MOUTH EVERY SEVEN DAYS WITH A FULL GLASS OF WATER ON  AN EMPTY STOMACH   amLODipine  (NORVASC ) 2.5 MG tablet Take 1 tablet (2.5 mg total) by mouth daily. Hold if blood pressure less than 130 systolic   aspirin EC 81 MG tablet Take 81 mg by mouth daily. Swallow whole.   Calcium Carb-Cholecalciferol (CALCIUM 600+D3 PO) Take 1 tablet by mouth 2 (two) times daily.   docusate sodium  (COLACE) 100 MG capsule Take 1 capsule (100 mg total) by mouth 2 (two) times daily.   ESTRING  7.5 MCG/24HR vaginal ring Place 1 each vaginally every 3 (three) months.   memantine  (NAMENDA ) 10 MG tablet Take 1 tablet (10 mg total) by mouth 2 (two) times daily.   Multiple Vitamin (MULTIVITAMIN) tablet Take 1 tablet by mouth daily.   amoxicillin -clavulanate (AUGMENTIN ) 875-125 MG tablet Take 1 tablet by mouth every 12 (twelve) hours. (Patient not taking: Reported on 04/08/2024)   ciclopirox  (PENLAC ) 8 % solution APPLY TOPICALLY AT BEDTIME. APPLY OVER NAIL AND SURROUNDING SKIN. APPLY DAILY OVER PREVIOUS COAT. AFTER SEVEN (7) DAYS, MAY REMOVE WITH ALCOHOL AND CONTINUE CYCLE. (Patient not taking: Reported on 04/08/2024)   sodium chloride  (MURO 128) 2 % ophthalmic solution Place 1 drop into both eyes daily. (Patient not taking: Reported on 04/08/2024)   No facility-administered encounter medications on file as of 04/08/2024.    Review of Systems  Constitutional:  Negative for appetite change, chills, fatigue, fever and unexpected weight change.  HENT:  Negative for congestion, dental problem, ear discharge, ear pain, facial swelling, hearing loss, nosebleeds, postnasal drip, rhinorrhea, sinus pressure, sinus pain, sneezing, sore throat, tinnitus and trouble swallowing.   Eyes:  Negative for pain, discharge, redness, itching and visual disturbance.  Respiratory:  Negative for cough, chest tightness, shortness of breath and wheezing.   Cardiovascular:  Negative for chest pain, palpitations and leg swelling.  Gastrointestinal:  Negative for abdominal distention, abdominal pain, blood in  stool, constipation, diarrhea, nausea and vomiting.  Endocrine: Negative for cold intolerance, heat intolerance, polydipsia, polyphagia and polyuria.  Genitourinary:  Negative for difficulty urinating, dysuria, flank pain, frequency and urgency.  Musculoskeletal:  Negative for arthralgias, back pain, gait problem, joint swelling, myalgias, neck pain and neck stiffness.  Skin:  Negative for color change, pallor, rash and wound.  Neurological:  Negative for dizziness, syncope, speech difficulty, weakness, light-headedness, numbness and headaches.  Hematological:  Does not bruise/bleed easily.  Psychiatric/Behavioral:  Negative for agitation, behavioral problems, confusion, hallucinations, self-injury, sleep disturbance and suicidal ideas. The patient is not nervous/anxious.     Immunization History  Administered Date(s) Administered   Fluad Quad(high Dose 65+) 09/01/2022   Influenza Split 09/25/2014   Influenza-Unspecified 09/25/2014, 09/09/2015, 10/04/2017, 06/02/2019   PFIZER Comirnaty(Gray Top)Covid-19 Tri-Sucrose Vaccine 02/17/2020, 03/22/2020   Pneumococcal Conjugate-13 09/09/2015   Pneumococcal Polysaccharide-23 01/01/2017   Tdap 01/09/2023   Zoster Recombinant(Shingrix) 04/06/2020   Zoster, Live 05/26/2010   Pertinent  Health Maintenance Due  Topic Date Due   INFLUENZA VACCINE  07/18/2024   DEXA SCAN  Completed      08/24/2023    2:28 PM 11/01/2023    2:48 PM 12/24/2023    3:11 PM 01/07/2024    1:43 PM 04/08/2024    1:47 PM  Fall Risk  Falls in the past year?  0 1 0 1 0  Was there an injury with Fall?  0  0 0  Fall Risk Category Calculator  2  1 0  Patient at Risk for Falls Due to  History of fall(s)  No Fall Risks History of fall(s)  Fall risk Follow up  Falls evaluation completed Falls evaluation completed Falls evaluation completed Falls evaluation completed   Functional Status Survey:    Vitals:   04/08/24 1351  BP: 118/60  Pulse: 60  Resp: 16  Temp: 97.7 F (36.5  C)  SpO2: 94%  Weight: 128 lb (58.1 kg)  Height: 5\' 5"  (1.651 m)   Body mass index is 21.3 kg/m. Physical Exam   VITALS: T- 97.7, P- 60, BP- 118/60, SaO2- 95% MEASUREMENTS: Weight- 128. GENERAL: Alert, cooperative, well developed, no acute distress. HEENT: Normocephalic, normal oropharynx, moist mucous membranes. CHEST: Clear to auscultation bilaterally, no wheezes, rhonchi, or crackles. CARDIOVASCULAR: Normal heart rate and rhythm, S1 and S2 normal without murmurs. ABDOMEN: Soft, non-tender, non-distended, without organomegaly, normal bowel sounds. EXTREMITIES: No cyanosis, edema, or swelling. NEUROLOGICAL: Cranial nerves grossly intact, moves all extremities without gross motor or sensory deficit, difficulty recalling names. SKIN: No rash,no lesion or erythema   PSYCHIATRY/BEHAVIORAL: Mood stable, memory loss    Labs reviewed: Recent Labs    03/10/24 1619 03/27/24 1427 04/05/24 1700  NA 142 137 138  K 4.3 4.5 4.7  CL 106 103 104  CO2 29 26 25   GLUCOSE 77 99 119*  BUN 25 27* 27*  CREATININE 1.18* 1.07* 1.05*  CALCIUM 9.8 9.6 9.4   Recent Labs    01/07/24 1417 02/24/24 1738 04/05/24 1700  AST 19 22 34  ALT 11 13 18   ALKPHOS  --  91 119  BILITOT 0.7 0.8 0.7  PROT 6.3 6.0* 5.9*  ALBUMIN  --  3.9 3.3*   Recent Labs    01/07/24 1417 02/24/24 1738 03/10/24 1619 03/27/24 1427 04/05/24 1700  WBC 6.4   < > 5.9 7.5 8.9  NEUTROABS 3,309  --  2,873  --  7.4  HGB 11.5*   < > 11.5* 11.6* 11.1*  HCT 34.3*   < > 34.7* 36.0 35.4*  MCV 92.5   < > 93.5 95.2 97.3  PLT 192   < > 225 219 210   < > = values in this interval not displayed.   Lab Results  Component Value Date   TSH 2.871 02/25/2024   No results found for: "HGBA1C" Lab Results  Component Value Date   CHOL 174 05/29/2022   HDL 82 05/29/2022   LDLCALC 70 05/29/2022   TRIG 135 05/29/2022   CHOLHDL 2.1 05/29/2022    Significant Diagnostic Results in last 30 days:  No results  found.  Assessment/Plan  Dementia Progressive dementia affecting cognitive and functional abilities, including difficulties with ambulation, toileting, and maintaining hydration, contributing to challenges in daily care and decision-making.request hospice referral - Refer to hospice for evaluation and potential home IV hydration support.  Dehydration Recurrent dehydration episodes leading to syncope, primarily due to inadequate fluid intake. Current hydration status is improved, but ongoing challenges with maintaining adequate fluid intake due to dementia. - Encourage frequent small sips of water throughout the day to maintain hydration.  CHF  - No signs of fluid overload  - Keep legs elevated when seated to keep swelling down  - check weight at least three times per week and notify provider for any abrupt weight gain > 3 lbs  - Reduce  salt intake in diet   Syncope and collapse Recent episodes of syncope likely due to dehydration, resolved with IV fluid administration in the hospital. No cardiac issues identified on EKG.  Chronic kidney disease Chronic kidney disease with recent lab work showing BUN at 27 and creatinine at 1.05, indicating mild dehydration. Kidney function appears stable.  Hypertension Blood pressure is well-controlled at 118/60 mmHg. Recent episodes of syncope were likely related to dehydration rather than primary hypertension issues. - Continue current antihypertensive medications.  Urine incontinence  Recent UTI treated successfully with amoxicillin . Urine specimen from recent hospital visit was normal.  Goals of Care Discussed goals of care including resuscitation preferences and potential hospice involvement. She wishes to be full code and desires resuscitation in case of emergency. Decisions regarding long-term life support, such as feeding tubes, will depend on specific circumstances and further discussion with her when she is in a clear state of mind. -  Discuss and document advanced directives and specific preferences for life-sustaining treatments.  Family/ staff Communication: Reviewed plan of care with patient verbalized understanding   Labs/tests ordered: None   Next Appointment: Return if symptoms worsen or fail to improve.   Total time:30 minutes. Greater than 50% of total time spent doing patient education regarding Dehydration,Dementia,CKD,HTN, syncope,urine incontinence,health maintenance including symptom/medication management.   Estil Heman, NP

## 2024-04-11 ENCOUNTER — Encounter: Payer: Self-pay | Admitting: Physician Assistant

## 2024-04-13 DIAGNOSIS — R4182 Altered mental status, unspecified: Secondary | ICD-10-CM | POA: Diagnosis not present

## 2024-04-13 DIAGNOSIS — G309 Alzheimer's disease, unspecified: Secondary | ICD-10-CM | POA: Diagnosis not present

## 2024-04-20 DIAGNOSIS — G309 Alzheimer's disease, unspecified: Secondary | ICD-10-CM | POA: Diagnosis not present

## 2024-04-20 DIAGNOSIS — R4182 Altered mental status, unspecified: Secondary | ICD-10-CM | POA: Diagnosis not present

## 2024-04-24 ENCOUNTER — Telehealth: Payer: Self-pay

## 2024-04-24 NOTE — Telephone Encounter (Signed)
 Thank you :)

## 2024-04-24 NOTE — Telephone Encounter (Signed)
 Copied from CRM [id number]. Topic: Clinical - Home Health Verbal Orders >> Apr 24, 2024  2:17 PM Carrielelia G wrote: Caller/Agency:  Almira with [facility] Callback Number: [phone] (secure line)  Service Requested: Physical Therapy Frequency: 1w6 starting 04/28/2024 Any new concerns about the patient? None  Left a very detail voicemail on a secure line to give Home Health Verbal Orders for patient to have physical Therapy FYI through [facility] Policy.  Verma Gobble, NP has been notified   Message sent to Verma Gobble, NP

## 2024-04-27 DIAGNOSIS — G309 Alzheimer's disease, unspecified: Secondary | ICD-10-CM | POA: Diagnosis not present

## 2024-04-27 DIAGNOSIS — R4182 Altered mental status, unspecified: Secondary | ICD-10-CM | POA: Diagnosis not present

## 2024-05-02 DIAGNOSIS — R4182 Altered mental status, unspecified: Secondary | ICD-10-CM | POA: Diagnosis not present

## 2024-05-02 DIAGNOSIS — G309 Alzheimer's disease, unspecified: Secondary | ICD-10-CM | POA: Diagnosis not present

## 2024-05-04 DIAGNOSIS — G309 Alzheimer's disease, unspecified: Secondary | ICD-10-CM | POA: Diagnosis not present

## 2024-05-04 DIAGNOSIS — R4182 Altered mental status, unspecified: Secondary | ICD-10-CM | POA: Diagnosis not present

## 2024-05-11 DIAGNOSIS — G309 Alzheimer's disease, unspecified: Secondary | ICD-10-CM | POA: Diagnosis not present

## 2024-05-11 DIAGNOSIS — R4182 Altered mental status, unspecified: Secondary | ICD-10-CM | POA: Diagnosis not present

## 2024-05-29 ENCOUNTER — Ambulatory Visit: Payer: Self-pay | Admitting: *Deleted

## 2024-05-29 NOTE — Telephone Encounter (Signed)
 Noted thank you

## 2024-05-29 NOTE — Telephone Encounter (Signed)
 FYI Only or Action Required?: FYI only for provider  Patient was last seen in primary care on 04/08/2024 by Ngetich, Elijio Guadel[place], NP. Called Nurse Triage reporting Advice Only. Symptoms began a week ago. Interventions attempted: Nothing. Symptoms are: stable.  Triage Disposition: Information or Advice Only Call  Patient/caregiver understands and will follow disposition?: Yes                Copied from CRM [id number]. Topic: Clinical - Red Word Triage >> May 29, 2024  1:52 PM Julie Oddi wrote: Red Word that prompted transfer to Nurse Triage: Fall last week Reason for Disposition  [1] Follow-up call to recent contact AND [2] information only call, no triage required  Answer Assessment - Initial Assessment Questions 1. REASON FOR CALL or QUESTION: What is your reason for calling today? or How can I best help you? or What question do you have that I can help answer?     HH nurse , Leah from [facility] called to report patient fell on 05/20/24 and observed by caregiver, patient was walking with walker and turned at corner slowly squatted and fell on right side. No injury noted after assessment per nurse today. Did not hit head.  Protocols used: Information Only Call - No Triage-A-AH

## 2024-06-06 ENCOUNTER — Ambulatory Visit: Payer: PPO | Admitting: Nurse Practitioner

## 2024-06-06 ENCOUNTER — Encounter: Payer: Self-pay | Admitting: Nurse Practitioner

## 2024-06-06 VITALS — Ht 65.0 in

## 2024-06-06 DIAGNOSIS — Z0000 Encounter for general adult medical examination without abnormal findings: Secondary | ICD-10-CM

## 2024-06-06 NOTE — Progress Notes (Signed)
 Subjective:   Alison Brewer is a 89 y.o. female who presents for Medicare Annual (Subsequent) preventive examination.  Visit Complete: Virtual I connected with  Linde Reveal on 06/06/24 by a video and audio enabled telemedicine application and verified that I am speaking with the correct person using two identifiers.  Patient Location: Home  Provider Location: Office/Clinic  I discussed the limitations of evaluation and management by telemedicine. The patient expressed understanding and agreed to proceed.  Vital Signs: Because this visit was a virtual/telehealth visit, some criteria may be missing or patient reported. Any vitals not documented were not able to be obtained and vitals that have been documented are patient reported.   Cardiac Risk Factors include: advanced age (>52men, >72 women);sedentary lifestyle;hypertension;dyslipidemia     Objective:    Today's Vitals   06/06/24 1125  Height: 5' 5 (1.651 m)   Body mass index is 21.3 kg/m.     04/08/2024    1:47 PM 03/27/2024   12:32 PM 02/24/2024    3:54 PM 12/24/2023    3:11 PM 08/24/2023    2:28 PM 06/22/2023   11:24 AM 06/01/2023    3:34 PM  Advanced Directives  Does Patient Have a Medical Advance Directive? Yes No No Yes No Yes Yes  Type of Engineer, structural Power of [employer] Power of Attorney  Does patient want to make changes to medical advance directive? No - Patient declined   No - Patient declined  No - Patient declined No - Patient declined  Copy of Healthcare Power of Attorney in Chart? Yes - validated most recent copy scanned in chart (See row information)   No - copy requested  No - copy requested Yes - validated most recent copy scanned in chart (See row information)  Would patient like information on creating a medical advance directive? No - Patient declined          Current Medications (verified) Outpatient Encounter  Medications as of 06/06/2024  Medication Sig   albuterol  (VENTOLIN  HFA) 108 (90 Base) MCG/ACT inhaler Inhale 1-2 puffs into the lungs every 6 (six) hours as needed for wheezing or shortness of breath.   alendronate  (FOSAMAX ) 70 MG tablet TAKE ONE TABLET BY MOUTH EVERY SEVEN DAYS WITH A FULL GLASS OF WATER ON AN EMPTY STOMACH   amLODipine  (NORVASC ) 2.5 MG tablet Take 1 tablet (2.5 mg total) by mouth daily. Hold if blood pressure less than 130 systolic   aspirin EC 81 MG tablet Take 81 mg by mouth daily. Swallow whole.   Calcium Carb-Cholecalciferol (CALCIUM 600+D3 PO) Take 1 tablet by mouth 2 (two) times daily.   ciclopirox  (PENLAC ) 8 % solution APPLY TOPICALLY AT BEDTIME. APPLY OVER NAIL AND SURROUNDING SKIN. APPLY DAILY OVER PREVIOUS COAT. AFTER SEVEN (7) DAYS, MAY REMOVE WITH ALCOHOL AND CONTINUE CYCLE.   ESTRING  7.5 MCG/24HR vaginal ring Place 1 each vaginally every 3 (three) months.   memantine  (NAMENDA ) 10 MG tablet Take 1 tablet (10 mg total) by mouth 2 (two) times daily.   Multiple Vitamin (MULTIVITAMIN) tablet Take 1 tablet by mouth daily.   [DISCONTINUED] docusate sodium  (COLACE) 100 MG capsule Take 1 capsule (100 mg total) by mouth 2 (two) times daily. (Patient taking differently: Take 100 mg by mouth daily as needed.)   [DISCONTINUED] amoxicillin -clavulanate (AUGMENTIN ) 875-125 MG tablet Take 1 tablet by mouth every 12 (twelve) hours. (Patient not taking: Reported on 06/06/2024)   [DISCONTINUED]  sodium chloride  (MURO 128) 2 % ophthalmic solution Place 1 drop into both eyes daily. (Patient not taking: Reported on 06/06/2024)   No facility-administered encounter medications on file as of 06/06/2024.    Allergies (verified) Patient has no known allergies.   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/EDC   HTN (hypertension)     Hyperlipidemia    Mild cardiomegaly    Past Surgical History:  Procedure Laterality Date   BREAST BIOPSY Right 1990   neg   Family History  Problem Relation Age of Onset   Pancreatic cancer Mother    Coronary artery disease Father    Cancer Brother    Breast cancer Maternal Aunt    Social History   Socioeconomic History   Marital status: Widowed    Spouse name: Not on file   Number of children: Not on file   Years of education: 12   Highest education level: Not on file  Occupational History   Not on file  Tobacco Use   Smoking status: Former    Current packs/day: 0.00    Average packs/day: 1.5 packs/day for 18.0 years (27.0 ttl pk-yrs)    Types: Cigarettes    Start date: 36    Quit date: 42    Years since quitting: 45.4   Smokeless tobacco: Never  Vaping Use   Vaping status: Never Used  Substance and Sexual Activity   Alcohol use: Never   Drug use: Never   Sexual activity: Not Currently    Birth control/protection: None  Other Topics Concern   Not on file  Social History Narrative   Right handed   Drink no coffe   One story home   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   Social Drivers of Corporate investment banker Strain: Not on file  Food Insecurity: Patient Unable To Answer (02/25/2024)   Hunger Vital Sign    Worried About Running Out of Food in the Last Year: Patient unable to answer    Ran Out of Food in the Last Year: Patient unable to answer  Transportation Needs: Patient Unable To Answer (02/26/2024)   PRAPARE - Transportation    Lack of Transportation (Medical): Patient unable to answer    Lack of Transportation (Non-Medical): Patient unable to answer  Physical Activity: Not on file  Stress: Not on file  Social Connections: Unknown (02/26/2024)   Social Connection and Isolation Panel    Frequency of Communication with Friends and Family: Patient unable to answer    Frequency of Social Gatherings with Friends and Family:  Patient unable to answer    Attends Religious Services: Not on Marketing executive or Organizations: Patient unable to answer    Attends Banker Meetings: Patient unable to answer    Marital Status: Patient unable to answer    Tobacco Counseling Counseling given: Not Answered   Clinical Intake:  Pre-visit preparation completed: Yes  Pain : No/denies pain     BMI - recorded: 21 Nutritional Status: BMI of 19-24  Normal Nutritional Risks: None Diabetes: No  How often do you need to have someone help you when you read instructions, pamphlets, or other written materials from your doctor or pharmacy?: 5 - Always  Activities of Daily Living    06/06/2024   12:58 PM 02/26/2024   12:15 AM  In your present state of health, do you have any difficulty performing the following activities:  Hearing? 1 0  Vision? 1 0  Difficulty concentrating or making decisions? 1 1  Walking or climbing stairs? 1   Dressing or bathing? 1   Doing errands, shopping? 1 1  Preparing Food and eating ? Y   Using the Toilet? N   In the past six months, have you accidently leaked urine? Y   Do you have problems with loss of bowel control? Y   Managing your Medications? Y   Managing your Finances? Y   Housekeeping or managing your Housekeeping? Y     Patient Care Team: Verma Gobble, NP as PCP - General (Geriatric Medicine) Lucendia Rusk, MD as PCP - Cardiology (Cardiology)  Indicate any recent Medical Services you may have received from other than [facility] providers in the past year (date may be approximate).     Assessment:   This is a routine wellness examination for Alison Brewer.  Hearing/Vision screen Hearing Screening - Comments:: Patient wears hearing aids. Vision Screening - Comments:: Patient is unsure   Goals Addressed   None    Depression Screen    06/06/2024   11:42 AM 04/08/2024    1:47 PM 11/01/2023    2:49 PM 08/24/2023    2:28 PM 06/04/2023     1:02 PM 04/09/2023    1:21 PM 03/19/2023    3:19 PM  PHQ 2/9 Scores  PHQ - 2 Score 0 0 0 0 0 0 0    Fall Risk    06/06/2024   11:40 AM 04/08/2024    1:47 PM 01/07/2024    1:43 PM 12/24/2023    3:11 PM 11/01/2023    2:48 PM  Fall Risk   Falls in the past year? 1 0 1 0 1  Number falls in past yr: 0 0 0 0 1  Injury with Fall? 0 0 0  0  Risk for fall due to : Impaired balance/gait;Impaired mobility History of fall(s) No Fall Risks  History of fall(s)  Follow up Falls evaluation completed Falls evaluation completed Falls evaluation completed Falls evaluation completed Falls evaluation completed    MEDICARE RISK AT HOME: Medicare Risk at Home Any stairs in or around the home?: Yes If so, are there any without handrails?: No Home free of loose throw rugs in walkways, pet beds, electrical cords, etc?: Yes Adequate lighting in your home to reduce risk of falls?: Yes Life alert?: No Use of a cane, walker or w/c?: Yes Grab bars in the bathroom?: Yes Shower chair or bench in shower?: Yes Elevated toilet seat or a handicapped toilet?: No  TIMED UP AND GO:  Was the test performed?  No    Cognitive Function:    06/22/2023   12:00 PM  MMSE - Mini Mental State Exam  Orientation to time 0  Orientation to Place 5  Registration 3  Attention/ Calculation 0  Recall 2  Language- name 2 objects 2  Language- repeat 1  Language- follow 3 step command 3  Language- read & follow direction 1  Write a sentence 0  Copy design 0  Total score 17      05/05/2022   10:00 AM  Montreal Cognitive Assessment   Visuospatial/ Executive (0/5) 0  Naming (0/3) 3  Attention: Read list of digits (0/2) 2  Attention: Read list  of letters (0/1) 1  Attention: Serial 7 subtraction starting at 100 (0/3) 0  Language: Repeat phrase (0/2) 1  Language : Fluency (0/1) 0  Abstraction (0/2) 0  Delayed Recall (0/5) 0  Orientation (0/6) 4  Total 11  Adjusted Score (based on education) 12      06/06/2024    11:42 AM 06/04/2023    1:05 PM 05/30/2022   11:14 AM  6CIT Screen  What Year? 4 points 4 points 0 points  What month? 0 points 3 points 0 points  What time? 3 points 3 points 3 points  Count back from 20 4 points 4 points 4 points  Months in reverse 4 points 4 points 4 points  Repeat phrase 10 points 8 points 10 points  Total Score 25 points 26 points 21 points    Immunizations Immunization History  Administered Date(s) Administered   Fluad Quad(high Dose 65+) 09/01/2022   Influenza Split 09/25/2014   Influenza-Unspecified 09/25/2014, 09/09/2015, 10/04/2017, 06/02/2019   PFIZER Comirnaty(Gray Top)Covid-19 Tri-Sucrose Vaccine 02/17/2020, 03/22/2020   Pneumococcal Conjugate-13 09/09/2015   Pneumococcal Polysaccharide-23 01/01/2017   Tdap 01/09/2023   Zoster Recombinant(Shingrix) 04/06/2020   Zoster, Live 05/26/2010    TDAP status: Up to date  Flu Vaccine status: Up to date  Pneumococcal vaccine status: Up to date  Covid-19 vaccine status: Information provided on how to obtain vaccines.   Qualifies for Shingles Vaccine? Yes   Zostavax completed No   Shingrix Completed?: No.    Education has been provided regarding the importance of this vaccine. Patient has been advised to call insurance company to determine out of pocket expense if they have not yet received this vaccine. Advised may also receive vaccine at local pharmacy or Health Dept. Verbalized acceptance and understanding.  Screening Tests Health Maintenance  Topic Date Due   COVID-19 Vaccine (3 - Pfizer risk series) 04/19/2020   Zoster Vaccines- Shingrix (2 of 2) 06/01/2020   INFLUENZA VACCINE  07/18/2024   Medicare Annual Wellness (AWV)  06/06/2025   DTaP/Tdap/Td (2 - Td or Tdap) 01/09/2033   Pneumococcal Vaccine: 50+ Years  Completed   DEXA SCAN  Completed   HPV VACCINES  Aged Out   Meningococcal B Vaccine  Aged Out    Health Maintenance  Health Maintenance Due  Topic Date Due   COVID-19 Vaccine (3 -  Pfizer risk series) 04/19/2020   Zoster Vaccines- Shingrix (2 of 2) 06/01/2020    Colorectal cancer screening: No longer required.   Mammogram status: No longer required due to age.  Bone Density status: Completed 12/20/2022. Results reflect: Bone density results: OSTEOPOROSIS. Repeat every 2 years.  Lung Cancer Screening: (Low Dose CT Chest recommended if Age 41-80 years, 20 pack-year currently smoking OR have quit w/in 15years.) does not qualify.   Lung Cancer Screening Referral: na  Additional Screening:  Hepatitis C Screening: does not qualify  Vision Screening: Recommended annual ophthalmology exams for early detection of glaucoma and other disorders of the eye. Is the patient up to date with their annual eye exam?  No  Who is the provider or what is the name of the office in which the patient attends annual eye exams? unsure If pt is not established with a provider, would they like to be referred to a provider to establish care? Yes .   Dental Screening: Recommended annual dental exams for proper oral hygiene   Community Resource Referral / Chronic Care Management: CRR required this visit?  No   CCM required this visit?  No     Plan:     I have personally reviewed and noted the following in the patient's chart:   Medical and social history Use of alcohol, tobacco or illicit drugs  Current medications and supplements including opioid prescriptions. Patient is not currently taking opioid prescriptions. Functional ability and status Nutritional status Physical activity Advanced directives List of other physicians Hospitalizations, surgeries, and ER visits in previous 12 months Vitals Screenings to include cognitive, depression, and falls Referrals and appointments  In addition, I have reviewed and discussed with patient certain preventive protocols, quality metrics, and best practice recommendations. A written personalized care plan for preventive services as well  as general preventive health recommendations were provided to patient.     Verma Gobble, NP   06/06/2024   After Visit Summary: (MyChart) Due to this being a telephonic visit, the after visit summary with patients personalized plan was offered to patient via MyChart

## 2024-06-06 NOTE — Progress Notes (Signed)
 This service is provided via telemedicine  No vital signs collected/recorded due to the encounter was a telemedicine visit.   Location of patient (ex: home, work):  home  Patient consents to a telephone visit: yes  Location of the provider (ex: office, home):  [facility]   Name of any referring provider:  N/A  Names of all persons participating in the telemedicine service and their role in the encounter:  Marithza Malachi/ RMA, Roselie Conger, Champ Coma, NP, and Patient.   Time spent on call:  11

## 2024-06-12 ENCOUNTER — Telehealth: Payer: Self-pay

## 2024-06-12 NOTE — Telephone Encounter (Signed)
 Papers placed in PCP Caro Harlene POUR, NP review and sign folder.

## 2024-06-12 NOTE — Telephone Encounter (Signed)
 Copied from CRM [id number]. Topic: Clinical - Home Health Verbal Orders >> Jun 12, 2024  9:22 AM Marda MATSU wrote: Caller/Agency: PJ with [facility] Callback Number: [phone] Service Requested: Home Health Care  PJ will be faxing over documents requesting home health care.

## 2024-06-13 NOTE — Progress Notes (Deleted)
 Name: Alison Brewer DOB: October 15, 1935 MRN: [id number]  History of Present Illness: Alison Brewer is a 89 y.o. female who presents today for follow up visit at [facility].  She is accompanied by her grandson Sidra (who is her caretaker, along with his wife). Josh provided today's history due to patient's dementia. - GU history: 1. Recurrent UTI.  2. Vaginal atrophy.   Urine culture results in past 12 months: - 04/09/2023: Positive for Klebsiella pneumoniae - 06/01/2023: Positive for Klebsiella pneumoniae - 08/14/2023: Positive for Klebsiella pneumoniae - 11/01/2023: Positive for E. Coli - 03/17/2024: Positive for Aerococcus urinae   At last visit on 03/17/2024: Estring  exchanged. Urine culture positive; treated with Augmentin .   Today: Her grandson reports that she has had *** UTIs since last visit.   She {Actions; denies-reports:120008} increased urinary urgency, frequency, nocturia, dysuria, gross hematuria, hesitancy, straining to void, or sensations of incomplete emptying.   Medications: Current Outpatient Medications  Medication Sig Dispense Refill   albuterol  (VENTOLIN  HFA) 108 (90 Base) MCG/ACT inhaler Inhale 1-2 puffs into the lungs every 6 (six) hours as needed for wheezing or shortness of breath. 18 g 0   alendronate  (FOSAMAX ) 70 MG tablet TAKE ONE TABLET BY MOUTH EVERY SEVEN DAYS WITH A FULL GLASS OF WATER ON AN EMPTY STOMACH 4 tablet 11   amLODipine  (NORVASC ) 2.5 MG tablet Take 1 tablet (2.5 mg total) by mouth daily. Hold if blood pressure less than 130 systolic 30 tablet 0   aspirin EC 81 MG tablet Take 81 mg by mouth daily. Swallow whole.     Calcium Carb-Cholecalciferol (CALCIUM 600+D3 PO) Take 1 tablet by mouth 2 (two) times daily.     ciclopirox  (PENLAC ) 8 % solution APPLY TOPICALLY AT BEDTIME. APPLY OVER NAIL AND SURROUNDING SKIN. APPLY DAILY OVER PREVIOUS COAT. AFTER SEVEN (7) DAYS, MAY REMOVE WITH ALCOHOL AND CONTINUE CYCLE. 6.6 mL 6   ESTRING  7.5  MCG/24HR vaginal ring Place 1 each vaginally every 3 (three) months.     memantine  (NAMENDA ) 10 MG tablet Take 1 tablet (10 mg total) by mouth 2 (two) times daily. 60 tablet 11   Multiple Vitamin (MULTIVITAMIN) tablet Take 1 tablet by mouth daily.     No current facility-administered medications for this visit.    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   HTN (hypertension)    Hyperlipidemia    Mild cardiomegaly    Past Surgical History:  Procedure Laterality Date   BREAST BIOPSY Right 1990   neg   Family History  Problem Relation Age of Onset   Pancreatic cancer Mother    Coronary artery disease Father    Cancer Brother    Breast cancer Maternal Aunt    Social History   Socioeconomic History   Marital status: Widowed    Spouse name: Not on file   Number of children: Not on file   Years of education: 12   Highest education level: Not on file  Occupational History   Not on file  Tobacco Use   Smoking status: Former    Current packs/day: 0.00    Average packs/day: 1.5 packs/day for 18.0 years (27.0 ttl pk-yrs)    Types: Cigarettes    Start date: 40    Quit date: 31  Years since quitting: 45.5   Smokeless tobacco: Never  Vaping Use   Vaping status: Never Used  Substance and Sexual Activity   Alcohol use: Never   Drug use: Never   Sexual activity: Not Currently    Birth control/protection: None  Other Topics Concern   Not on file  Social History Narrative   Right handed   Drink no coffe   One story home   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   Social Drivers of Corporate investment banker Strain: Not on file  Food Insecurity: Patient Unable To Answer (02/25/2024)   Hunger Vital Sign    Worried About Running Out of Food in the Last Year:  Patient unable to answer    Ran Out of Food in the Last Year: Patient unable to answer  Transportation Needs: Patient Unable To Answer (02/26/2024)   PRAPARE - Transportation    Lack of Transportation (Medical): Patient unable to answer    Lack of Transportation (Non-Medical): Patient unable to answer  Physical Activity: Not on file  Stress: Not on file  Social Connections: Unknown (02/26/2024)   Social Connection and Isolation Panel    Frequency of Communication with Friends and Family: Patient unable to answer    Frequency of Social Gatherings with Friends and Family: Patient unable to answer    Attends Religious Services: Not on file    Active Member of Clubs or Organizations: Patient unable to answer    Attends Banker Meetings: Patient unable to answer    Marital Status: Patient unable to answer  Intimate Partner Violence: Patient Unable To Answer (02/26/2024)   Humiliation, Afraid, Rape, and Kick questionnaire    Fear of Current or Ex-Partner: Patient unable to answer    Emotionally Abused: Patient unable to answer    Physically Abused: Patient unable to answer    Sexually Abused: Patient unable to answer    Review of Systems Constitutional: Patient denies any unintentional weight loss or change in strength lntegumentary: Patient denies any rashes or pruritus Cardiovascular: Patient denies chest pain or syncope Respiratory: Patient denies shortness of breath Gastrointestinal: ***Patient denies nausea, vomiting, constipation, or diarrhea ***As per HPI Musculoskeletal: Patient denies muscle cramps or weakness Neurologic: Patient denies convulsions or seizures Allergic/Immunologic: Patient denies recent allergic reaction(s) Hematologic/Lymphatic: Patient denies bleeding tendencies Endocrine: Patient denies heat/cold intolerance  GU: As per HPI.  OBJECTIVE There were no vitals filed for this visit. There is no height or weight on file to calculate BMI.  Physical  Examination Constitutional: No obvious distress; patient is non-toxic appearing  Cardiovascular: No visible lower extremity edema.  Respiratory: The patient does not have audible wheezing/stridor; respirations do not appear labored  Gastrointestinal: Abdomen non-distended Musculoskeletal: Normal ROM of UEs  Skin: No obvious rashes/open sores  Neurologic: CN 2-12 grossly intact Psychiatric: Answered questions appropriately with normal affect  Hematologic/Lymphatic/Immunologic: No obvious bruises or sites of spontaneous bleeding  UA: ***negative ***positive for *** leukocytes, *** blood, ***nitrites Urine microscopy: *** WBC/hpf, *** RBC/hpf, *** bacteria ***glucosuria (secondary to ***Jardiance ***Farxiga use) ***otherwise unremarkable  PVR: *** ml  ASSESSMENT No diagnosis found. ***  We agreed to plan for follow up in *** months / ***1 year or sooner if needed. Patient verbalized understanding of and agreement with current plan. All questions were answered.  PLAN Advised the following: 1. *** 2. ***No follow-ups on file.  No orders of the defined types were placed in this encounter.   It  has been explained that the patient is to follow regularly with their PCP in addition to all other providers involved in their care and to follow instructions provided by these respective offices. Patient advised to contact urology clinic if any urologic-pertaining questions, concerns, new symptoms or problems arise in the interim period.  There are no Patient Instructions on file for this visit.  Electronically signed by:  Lauraine JAYSON Oz, FNP   06/13/24    2:21 PM

## 2024-06-16 ENCOUNTER — Telehealth: Admitting: Physician Assistant

## 2024-06-16 ENCOUNTER — Encounter: Payer: Self-pay | Admitting: Physician Assistant

## 2024-06-16 VITALS — Ht 65.0 in | Wt 126.0 lb

## 2024-06-16 DIAGNOSIS — G309 Alzheimer's disease, unspecified: Secondary | ICD-10-CM | POA: Diagnosis not present

## 2024-06-16 DIAGNOSIS — F0280 Dementia in other diseases classified elsewhere without behavioral disturbance: Secondary | ICD-10-CM

## 2024-06-16 NOTE — Progress Notes (Signed)
 Virtual Visit via Video Note The purpose of this virtual visit is to provide medical care in a patient that is unable to be seen in person due to physical or health limitations   Consent was obtained for video visit:  yes  Answered questions that patient had about telehealth interaction:  yes I discussed the limitations, risks, security and privacy concerns of performing an evaluation and management service by telemedicine. I also discussed with the patient that there may be a patient responsible charge related to this service. The patient expressed understanding and agreed to proceed.  Pt location: Home Physician Location: office Name of referring provider:  Caro Harlene POUR, NP I connected with Valentin FORBES Muzzy at patients initiation/request on 06/16/2024 at  3:30 PM EDT by video enabled telemedicine application and verified that I am speaking with the correct person using two identifiers. Pt MRN:  [id number] Pt DOB:  1935-07-12 Video Participants:  Valentin FORBES Muzzy; her grandson    Assessment and Plan:     Dementia due to Alzheimer's disease   Alison Brewer is a very pleasant 89 y.o. [name] with a history of arthritis, hypertension, hyperlipidemia, HOH, and dementia likely due to Alzheimer's disease and vascular etiology seen today in follow up for memory loss. Patient is currently on memantine  10 mg twice daily.  Progression of disease is noted, both with short-term and long-term memory.  She needs more assistance with ADLs.  She is essentially non conversant and has more difficulty to ambulate, needs a walker. Mood is stable.  She has worsening insight into her condition.  Has intentions to being involved in her care, but her grandson may not be ready at this point to have that type of approach.  We discussed that her condition may deteriorate as time goes by and that she will eventually need 24/7 care and monitor for safety, recommended to reconsider it.  Discussed discontinuing  memantine  but her grandson wishes to continue on the medication at this point    Follow up in  3 months. Continue memantine  10 mg twice daily, side effects discussed Recommend 24/7 monitoring, agree with considering hospice involvement for comfort care and prevention of hospitalization Recommend good control of her cardiovascular risk factors Continue to control mood as per PCP     Subjective:    This patient is accompanied in the office by her grandson who supplements the history.  Previous records as well as any outside records available were reviewed prior to todays visit. Patient was last seen on 12/24/2023   Any changes in memory since last visit? Both short-term and long-term are affected.  She is essentially non conversant.  She may not recognize her family members including her grandson.  She is no longer writing.  She has more difficulty with coloring and following instructions as well as with names of products such as shampoo.  She has not participate in social events anymore.  She needs surveillance for safety Repeats oneself?  Endorsed Disoriented when walking into a room?  At times she may not recognize where she is at  leaving objects?  She used to misplace things such as closing the trash can, diapers in the drawer, but she is now is less mobile and needs more surveillance.  Wandering behavior?  denies   Any personality changes since last visit?  She may be more scared unable to relax. Any worsening depression?:  Denies.   Hallucinations or paranoia?  Denies.   Seizures? denies  Any sleep changes?  Sleeps well denies vivid dreams, REM behavior or sleepwalking   Sleep apnea?   Denies.   Any hygiene concerns? Denies.  Independent of bathing and dressing?  Endorsed  Does the patient needs help with medications?  [place] is in charge   Who is in charge of the finances?  Guardian of state is in charge     Any changes in appetite?  She still able to chew, but pockets the  food in her mouth and eats with her hands     Patient have trouble swallowing? Denies.   Does the patient cook? No Any headaches?   denies   Any vision changes? Chronic back pain  denies   Ambulates with difficulty?  Ability to walk is bad , has a tendency to fall, because she may not be remembering how to do so  recent falls or head injuries? Recent fall when squatting but she did not have a bad fall  Stroke symptoms? denies   Any tremors?  Denies   Any anosmia?  Denies   Any incontinence of urine? 2-3 times a day.  She has a history of recurrent UTIs,  sees urology.   Any bowel dysfunction?   Occasional constipation      Patient lives with her grandson    Does the patient drive? 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     Initial visit 04/2022  How long did patient have memory difficulties? Every year is worse, but these may have been present for about 15 years, and yet, over the last 10 years has been wears.  Changes have been progressive, but it was brought to her grandsons attention when she could not remember family members birthdays which was unusual for her.  Patient is in denial that there is anything wrong with her.   The person that noticed the biggest changes was her grandson who lives with her.  She is very concerned about numbers, she cannot put them together .  In addition, if the topic is changed, she may be stuck on the prior subject .  This has been noticed by myself during this visit as well.   She has had a recent UTI, and dehydration, in which she had acute mental status changes, she bounced back, except for the numbers . Patient lives with: Her grandson and his wife from [place], who moved to live with her about 3 years ago.  Her  husband died in 06-27-2015.   repeats oneself? She always did but is worse over the last 3 y. Disoriented when walking into a room?  Patient denies   Leaving objects in unusual places?  Patient denies.  She is very organized, and she puts the silverware back in the drawer according to her grandson.  However, she likes to hoard, does not like to throw things, although does not living in weird places.  Ambulates  with difficulty?   Patient denies. She walks more than before.  She lives in [facility], and likes to walk around the house frequently.  Recent falls?  Patient denies   Any head injuries?  Patient denies   History of seizures?   Patient denies   Wandering behavior?  Patient denies   Patient drives?   Patient no  longer drives she had a couple of episodes getting lost when going to the pizza place  Any mood changes such irritability agitation?  Patient denies   Any history of depression?:  Patient denies   Hallucinations?  Patient denies   Paranoia?  Patient denies   Patient reports that he sleeps well without vivid dreams, REM behavior or sleepwalking   History of sleep apnea?  Patient denies   Any hygiene concerns?  Patient denies   Independent of bathing and dressing?  Endorsed  Does the patient needs help with medications?  Patient denies.  She never took many medications. Who is in charge of the finances?  Stepchildren  Patient have trouble swallowing? Patient denies   Does the patient cook? Her grandson does  Any kitchen accidents such as leaving the stove on? Patient denies   Any headaches?  Patient denies   The double vision? Patient denies   Any focal numbness or tingling?   I am not sure  Chronic back pain Patient denies   Unilateral weakness?  Patient denies   Any tremors?  Patient denies   Any history of anosmia? Decrease in the sense of smell , and decreased hearing  Any incontinence of urine? Endorses incontinence  Patient had a recent E. Coli UTI  Any bowel  dysfunction?   Patient occasionally uses prunes  History of heavy alcohol intake?  Patient denies   History of heavy tobacco use?  Patient denies   Family history of dementia?  father may have had, Alzheimer's disease        Current Outpatient Medications on File Prior to Visit  Medication Sig Dispense Refill   albuterol  (VENTOLIN  HFA) 108 (90 Base) MCG/ACT inhaler Inhale 1-2 puffs into the lungs every 6 (six) hours as needed for wheezing or shortness of breath. 18 g 0   alendronate  (FOSAMAX ) 70 MG tablet TAKE ONE TABLET BY MOUTH EVERY SEVEN DAYS WITH A FULL GLASS OF WATER ON AN EMPTY STOMACH 4 tablet 11   aspirin EC 81 MG tablet Take 81 mg by mouth daily. Swallow whole.     Calcium Carb-Cholecalciferol (CALCIUM 600+D3 PO) Take 1 tablet by mouth 2 (two) times daily.     ciclopirox  (PENLAC ) 8 % solution APPLY TOPICALLY AT BEDTIME. APPLY OVER NAIL AND SURROUNDING SKIN. APPLY DAILY OVER PREVIOUS COAT. AFTER SEVEN (7) DAYS, MAY REMOVE WITH ALCOHOL AND CONTINUE CYCLE. (Patient taking differently: Apply topically as needed. Apply over nail and surrounding skin. Apply daily over previous coat. After seven (7) days, may remove with alcohol and continue cycle.) 6.6 mL 6   ESTRING  7.5 MCG/24HR vaginal ring Place 1 each vaginally every 3 (three) months.     memantine  (NAMENDA ) 10 MG tablet Take 1 tablet (10 mg total) by mouth 2 (two) times daily. 60 tablet 11   Multiple Vitamin (MULTIVITAMIN) tablet Take 1 tablet by mouth daily.     amLODipine  (NORVASC ) 2.5 MG tablet Take 1 tablet (2.5 mg total) by mouth daily. Hold if blood pressure less than 130 systolic (Patient not taking: Reported on 06/16/2024) 30 tablet 0   No current facility-administered medications on file prior to visit.      06/22/2023   12:00 PM  MMSE - Mini Mental State Exam  Orientation to time 0  Orientation to Place 5  Registration 3  Attention/ Calculation 0  Recall 2  Language- name 2 objects 2  Language- repeat 1  Language-  follow 3 step command 3  Language- read & follow direction  1  Write a sentence 0  Copy design 0  Total score 17     Observations/Objective:   Vitals:   06/16/24 1510  Weight: 126 lb (57.2 kg)  Height: 5' 5 (1.651 m)   GEN:  The patient appears stated age and is in NAD.  Neurological examination: Patient is awake, alert, not oriented to person, not to place or date . No aphasia or dysarthria. decreased  comprehension. Remote and recent memory impaired.Unable to repeat phrases. Cranial nerves: Extraocular movements intact with no nystagmus. No facial asymmetry.  Flat affect motor: moves all extremities symmetrically, at least anti-gravity x 4.  Does not seem to understand the finger-to-nose commands  Gait: narrow-based and steady, uses a walker to ambulate but then stops and hesitates, at which point she does not know what is the next step to do     Follow Up Instructions:    -I discussed the assessment and treatment plan with the patient. The patient was provided an opportunity to ask questions and all were answered. The patient agreed with the plan and demonstrated an understanding of the instructions.   The patient was advised to call back or seek an in-person evaluation if the symptoms worsen or if the condition fails to improve as anticipated.    Total time spent on today's visit was , including both face-to-face time and nonface-to-face time.  Time included that spent on review of records (prior notes available to me/labs/imaging if pertinent), discussing treatment and goals, answering patient's questions and coordinating care.   Camie Sevin, PA-C

## 2024-06-16 NOTE — Patient Instructions (Signed)
 It was a pleasure to see you today at our office.   Recommendations:  Follow up in 3 months  Continue  Memantine  to 10 mg tablets  1 tablet twice daily.      Whom to call:  Memory  decline, memory medications: Call our office [phone]   For psychiatric meds, mood meds: Please have your primary care physician manage these medications.          RECOMMENDATIONS FOR ALL PATIENTS WITH MEMORY PROBLEMS: 1. Continue to exercise (Recommend 30 minutes of walking everyday, or 3 hours every week) 2. Increase social interactions - continue going to [place] and enjoy social gatherings with friends and family 3. Eat healthy, avoid fried foods and eat more fruits and vegetables 4. Maintain adequate blood pressure, blood sugar, and blood cholesterol level. Reducing the risk of stroke and cardiovascular disease also helps promoting better memory. 5. Avoid stressful situations. Live a simple life and avoid aggravations. Organize your time and prepare for the next day in anticipation. 6. Sleep well, avoid any interruptions of sleep and avoid any distractions in the bedroom that may interfere with adequate sleep quality 7. Avoid sugar, avoid sweets as there is a strong link between excessive sugar intake, diabetes, and cognitive impairment We discussed the Mediterranean diet, which has been shown to help patients reduce the risk of progressive memory disorders and reduces cardiovascular risk. This includes eating fish, eat fruits and green leafy vegetables, nuts like almonds and hazelnuts, walnuts, and also use olive oil. Avoid fast foods and fried foods as much as possible. Avoid sweets and sugar as sugar use has been linked to worsening of memory function.  There is always a concern of gradual progression of memory problems. If this is the case, then we may need to adjust level of care according to patient needs. Support, both to the patient and caregiver, should then be put into place.    The  Alzheimer[employer] Association is here all day, every day for people facing Alzheimer's disease through our free 24/7 Helpline: [phone]. The Helpline provides reliable information and support to all those who need assistance, such as individuals living with memory loss, Alzheimer's or other dementia, caregivers, health care professionals and the public.  Our highly trained and knowledgeable staff can help you with: Understanding memory loss, dementia and Alzheimer's  Medications and other treatment options  General information about aging and brain health  Skills to provide quality care and to find the best care from professionals  Legal, financial and living-arrangement decisions Our Helpline also features: Confidential care consultation provided by master's level clinicians who can help with decision-making support, crisis assistance and education on issues families face every day  Help in a caller's preferred language using our translation service that features more than 200 languages and dialects  Referrals to local community programs, services and ongoing support     FALL PRECAUTIONS: Be cautious when walking. Scan the area for obstacles that may increase the risk of trips and falls. When getting up in the mornings, sit up at the edge of the bed for a few minutes before getting out of bed. Consider elevating the bed at the head end to avoid drop of blood pressure when getting up. Walk always in a well-lit room (use night lights in the walls). Avoid area rugs or power cords from appliances in the middle of the walkways. Use a walker or a cane if necessary and consider physical therapy for balance exercise. Get your eyesight checked regularly.  FINANCIAL OVERSIGHT: Supervision, especially oversight when making financial decisions or transactions is also recommended.  HOME SAFETY: Consider the safety of the kitchen when operating appliances like stoves, microwave oven, and blender. Consider having  supervision and share cooking responsibilities until no longer able to participate in those. Accidents with firearms and other hazards in the house should be identified and addressed as well.   ABILITY TO BE LEFT ALONE: If patient is unable to contact 911 operator, consider using LifeLine, or when the need is there, arrange for someone to stay with patients. Smoking is a fire hazard, consider supervision or cessation. Risk of wandering should be assessed by caregiver and if detected at any point, supervision and safe proof recommendations should be instituted.  MEDICATION SUPERVISION: Inability to self-administer medication needs to be constantly addressed. Implement a mechanism to ensure safe administration of the medications.      Mediterranean Diet A Mediterranean diet refers to food and lifestyle choices that are based on the traditions of countries located on the [employer]. This way of eating has been shown to help prevent certain conditions and improve outcomes for people who have chronic diseases, like kidney disease and heart disease. What are tips for following this plan? Lifestyle  Cook and eat meals together with your family, when possible. Drink enough fluid to keep your urine clear or pale yellow. Be physically active every day. This includes: Aerobic exercise like running or swimming. Leisure activities like gardening, walking, or housework. Get 7-8 hours of sleep each night. If recommended by your health care provider, drink red wine in moderation. This means 1 glass a day for nonpregnant women and 2 glasses a day for men. A glass of wine equals 5 oz (150 mL). Reading food labels  Check the serving size of packaged foods. For foods such as rice and pasta, the serving size refers to the amount of cooked product, not dry. Check the total fat in packaged foods. Avoid foods that have saturated fat or trans fats. Check the ingredients list for added sugars, such as corn  syrup. Shopping  At the grocery store, buy most of your food from the areas near the walls of the store. This includes: Fresh fruits and vegetables (produce). Grains, beans, nuts, and seeds. Some of these may be available in unpackaged forms or large amounts (in bulk). Fresh seafood. Poultry and eggs. Low-fat dairy products. Buy whole ingredients instead of prepackaged foods. Buy fresh fruits and vegetables in-season from local farmers markets. Buy frozen fruits and vegetables in resealable bags. If you do not have access to quality fresh seafood, buy precooked frozen shrimp or canned fish, such as tuna, salmon, or sardines. Buy small amounts of raw or cooked vegetables, salads, or olives from the deli or salad bar at your store. Stock your pantry so you always have certain foods on hand, such as olive oil, canned tuna, canned tomatoes, rice, pasta, and beans. Cooking  Cook foods with extra-virgin olive oil instead of using butter or other vegetable oils. Have meat as a side dish, and have vegetables or grains as your main dish. This means having meat in small portions or adding small amounts of meat to foods like pasta or stew. Use beans or vegetables instead of meat in common dishes like chili or lasagna. Experiment with different cooking methods. Try roasting or broiling vegetables instead of steaming or sauteing them. Add frozen vegetables to soups, stews, pasta, or rice. Add nuts or seeds for added healthy fat at each  meal. You can add these to yogurt, salads, or vegetable dishes. Marinate fish or vegetables using olive oil, lemon juice, garlic, and fresh herbs. Meal planning  Plan to eat 1 vegetarian meal one day each week. Try to work up to 2 vegetarian meals, if possible. Eat seafood 2 or more times a week. Have healthy snacks readily available, such as: Vegetable sticks with hummus. Greek yogurt. Fruit and nut trail mix. Eat balanced meals throughout the week. This  includes: Fruit: 2-3 servings a day Vegetables: 4-5 servings a day Low-fat dairy: 2 servings a day Fish, poultry, or lean meat: 1 serving a day Beans and legumes: 2 or more servings a week Nuts and seeds: 1-2 servings a day Whole grains: 6-8 servings a day Extra-virgin olive oil: 3-4 servings a day Limit red meat and sweets to only a few servings a month What are my food choices? Mediterranean diet Recommended Grains: Whole-grain pasta. Brown rice. Bulgar wheat. Polenta. Couscous. Whole-wheat bread. [place] Made[place]. Vegetables: Artichokes. Beets. Broccoli. Cabbage. Carrots. Eggplant. Green beans. Chard. Kale. Spinach. Onions. Leeks. Peas. Squash. Tomatoes. Peppers. Radishes. Fruits: Apples. Apricots. Avocado. Berries. Bananas. Cherries. Dates. Figs. Grapes. Lemons. Melon. Oranges. Peaches. Plums. Pomegranate. Meats and other protein foods: Beans. Almonds. Sunflower seeds. Pine nuts. Peanuts. Cod. Salmon. Scallops. Shrimp. Tuna. Tilapia. Clams. Oysters. Eggs. Dairy: Low-fat milk. Cheese. Greek yogurt. Beverages: Water. Red wine. Herbal tea. Fats and oils: Extra virgin olive oil. Avocado oil. Grape seed oil. Sweets and desserts: [place] yogurt with honey. Baked apples. Poached pears. Trail mix. Seasoning and other foods: Basil. Cilantro. Coriander. Cumin. Mint. Parsley. Sage. Rosemary. Tarragon. Garlic. Oregano. Thyme. Pepper. Balsalmic vinegar. Tahini. Hummus. Tomato sauce. Olives. Mushrooms. Limit these Grains: Prepackaged pasta or rice dishes. Prepackaged cereal with added sugar. Vegetables: Deep fried potatoes (french fries). Fruits: Fruit canned in syrup. Meats and other protein foods: Beef. Pork. Lamb. Poultry with skin. Hot dogs. [place]. Dairy: Ice cream. Sour cream. Whole milk. Beverages: Juice. Sugar-sweetened soft drinks. Beer. Liquor and spirits. Fats and oils: Butter. Canola oil. Vegetable oil. Beef fat (tallow). Lard. Sweets and desserts: Cookies. Cakes. Pies. Candy. Seasoning  and other foods: Mayonnaise. Premade sauces and marinades. The items listed may not be a complete list. Talk with your dietitian about what dietary choices are right for you. Summary The Mediterranean diet includes both food and lifestyle choices. Eat a variety of fresh fruits and vegetables, beans, nuts, seeds, and whole grains. Limit the amount of red meat and sweets that you eat. Talk with your health care provider about whether it is safe for you to drink red wine in moderation. This means 1 glass a day for nonpregnant women and 2 glasses a day for men. A glass of wine equals 5 oz (150 mL). This information is not intended to replace advice given to you by your health care provider. Make sure you discuss any questions you have with your health care provider. Document Released: 07/27/2016 Document Revised: 08/29/2016 Document Reviewed: 07/27/2016 Elsevier Interactive Patient Education  2017 [employer].

## 2024-06-17 ENCOUNTER — Ambulatory Visit: Admitting: Urology

## 2024-06-17 DIAGNOSIS — N390 Urinary tract infection, site not specified: Secondary | ICD-10-CM

## 2024-06-17 DIAGNOSIS — N952 Postmenopausal atrophic vaginitis: Secondary | ICD-10-CM

## 2024-06-26 ENCOUNTER — Ambulatory Visit: Payer: PPO | Admitting: Physician Assistant

## 2024-07-02 ENCOUNTER — Inpatient Hospital Stay (HOSPITAL_COMMUNITY)
Admission: EM | Admit: 2024-07-02 | Discharge: 2024-07-05 | DRG: 175 | Disposition: A | Discharge status: Hospice | Attending: Internal Medicine | Admitting: Internal Medicine

## 2024-07-02 ENCOUNTER — Emergency Department (HOSPITAL_COMMUNITY)

## 2024-07-02 ENCOUNTER — Encounter (HOSPITAL_COMMUNITY): Payer: Self-pay | Admitting: Emergency Medicine

## 2024-07-02 DIAGNOSIS — I5022 Chronic systolic (congestive) heart failure: Secondary | ICD-10-CM | POA: Diagnosis present

## 2024-07-02 DIAGNOSIS — I10 Essential (primary) hypertension: Secondary | ICD-10-CM | POA: Diagnosis not present

## 2024-07-02 DIAGNOSIS — J90 Pleural effusion, not elsewhere classified: Secondary | ICD-10-CM | POA: Diagnosis not present

## 2024-07-02 DIAGNOSIS — F0280 Dementia in other diseases classified elsewhere without behavioral disturbance: Secondary | ICD-10-CM | POA: Diagnosis not present

## 2024-07-02 DIAGNOSIS — D631 Anemia in chronic kidney disease: Secondary | ICD-10-CM | POA: Diagnosis present

## 2024-07-02 DIAGNOSIS — I2699 Other pulmonary embolism without acute cor pulmonale: Secondary | ICD-10-CM | POA: Diagnosis not present

## 2024-07-02 DIAGNOSIS — E785 Hyperlipidemia, unspecified: Secondary | ICD-10-CM | POA: Diagnosis present

## 2024-07-02 DIAGNOSIS — J9811 Atelectasis: Secondary | ICD-10-CM | POA: Diagnosis not present

## 2024-07-02 DIAGNOSIS — Z7983 Long term (current) use of bisphosphonates: Secondary | ICD-10-CM

## 2024-07-02 DIAGNOSIS — I5020 Unspecified systolic (congestive) heart failure: Secondary | ICD-10-CM | POA: Diagnosis not present

## 2024-07-02 DIAGNOSIS — R0989 Other specified symptoms and signs involving the circulatory and respiratory systems: Secondary | ICD-10-CM | POA: Diagnosis not present

## 2024-07-02 DIAGNOSIS — M47816 Spondylosis without myelopathy or radiculopathy, lumbar region: Secondary | ICD-10-CM | POA: Diagnosis not present

## 2024-07-02 DIAGNOSIS — Z515 Encounter for palliative care: Secondary | ICD-10-CM

## 2024-07-02 DIAGNOSIS — Z8589 Personal history of malignant neoplasm of other organs and systems: Secondary | ICD-10-CM

## 2024-07-02 DIAGNOSIS — Z800 Family history of malignant neoplasm of digestive organs: Secondary | ICD-10-CM

## 2024-07-02 DIAGNOSIS — W19XXXA Unspecified fall, initial encounter: Principal | ICD-10-CM | POA: Diagnosis present

## 2024-07-02 DIAGNOSIS — S0990XA Unspecified injury of head, initial encounter: Secondary | ICD-10-CM | POA: Diagnosis not present

## 2024-07-02 DIAGNOSIS — I110 Hypertensive heart disease with heart failure: Secondary | ICD-10-CM | POA: Diagnosis present

## 2024-07-02 DIAGNOSIS — G309 Alzheimer's disease, unspecified: Secondary | ICD-10-CM | POA: Diagnosis not present

## 2024-07-02 DIAGNOSIS — R296 Repeated falls: Secondary | ICD-10-CM | POA: Diagnosis present

## 2024-07-02 DIAGNOSIS — Z87891 Personal history of nicotine dependence: Secondary | ICD-10-CM

## 2024-07-02 DIAGNOSIS — I2602 Saddle embolus of pulmonary artery with acute cor pulmonale: Secondary | ICD-10-CM | POA: Diagnosis not present

## 2024-07-02 DIAGNOSIS — S0101XA Laceration without foreign body of scalp, initial encounter: Secondary | ICD-10-CM | POA: Diagnosis present

## 2024-07-02 DIAGNOSIS — R079 Chest pain, unspecified: Secondary | ICD-10-CM | POA: Diagnosis not present

## 2024-07-02 DIAGNOSIS — Y92019 Unspecified place in single-family (private) house as the place of occurrence of the external cause: Secondary | ICD-10-CM

## 2024-07-02 DIAGNOSIS — I2609 Other pulmonary embolism with acute cor pulmonale: Principal | ICD-10-CM | POA: Diagnosis present

## 2024-07-02 DIAGNOSIS — Z79899 Other long term (current) drug therapy: Secondary | ICD-10-CM

## 2024-07-02 DIAGNOSIS — Z9181 History of falling: Secondary | ICD-10-CM

## 2024-07-02 DIAGNOSIS — G319 Degenerative disease of nervous system, unspecified: Secondary | ICD-10-CM | POA: Diagnosis not present

## 2024-07-02 DIAGNOSIS — L89152 Pressure ulcer of sacral region, stage 2: Secondary | ICD-10-CM | POA: Diagnosis present

## 2024-07-02 DIAGNOSIS — R55 Syncope and collapse: Secondary | ICD-10-CM | POA: Diagnosis not present

## 2024-07-02 DIAGNOSIS — Z7982 Long term (current) use of aspirin: Secondary | ICD-10-CM

## 2024-07-02 DIAGNOSIS — F05 Delirium due to known physiological condition: Secondary | ICD-10-CM | POA: Diagnosis present

## 2024-07-02 DIAGNOSIS — S199XXA Unspecified injury of neck, initial encounter: Secondary | ICD-10-CM | POA: Diagnosis not present

## 2024-07-02 DIAGNOSIS — Z7901 Long term (current) use of anticoagulants: Secondary | ICD-10-CM

## 2024-07-02 DIAGNOSIS — Z8249 Family history of ischemic heart disease and other diseases of the circulatory system: Secondary | ICD-10-CM

## 2024-07-02 DIAGNOSIS — M1612 Unilateral primary osteoarthritis, left hip: Secondary | ICD-10-CM | POA: Diagnosis not present

## 2024-07-02 DIAGNOSIS — H9190 Unspecified hearing loss, unspecified ear: Secondary | ICD-10-CM | POA: Diagnosis present

## 2024-07-02 DIAGNOSIS — S0003XA Contusion of scalp, initial encounter: Secondary | ICD-10-CM | POA: Diagnosis not present

## 2024-07-02 DIAGNOSIS — R102 Pelvic and perineal pain: Secondary | ICD-10-CM | POA: Diagnosis not present

## 2024-07-02 DIAGNOSIS — R918 Other nonspecific abnormal finding of lung field: Secondary | ICD-10-CM | POA: Diagnosis not present

## 2024-07-02 DIAGNOSIS — I517 Cardiomegaly: Secondary | ICD-10-CM | POA: Diagnosis not present

## 2024-07-02 DIAGNOSIS — R0902 Hypoxemia: Secondary | ICD-10-CM | POA: Diagnosis not present

## 2024-07-02 DIAGNOSIS — Z66 Do not resuscitate: Secondary | ICD-10-CM | POA: Diagnosis present

## 2024-07-02 DIAGNOSIS — Z803 Family history of malignant neoplasm of breast: Secondary | ICD-10-CM

## 2024-07-02 LAB — CBC WITH DIFFERENTIAL/PLATELET
Abs Immature Granulocytes: 0.04 K/uL (ref 0.00–0.07)
Basophils Absolute: 0 K/uL (ref 0.0–0.1)
Basophils Relative: 0 %
Eosinophils Absolute: 0.1 K/uL (ref 0.0–0.5)
Eosinophils Relative: 1 %
HCT: 33 % — ABNORMAL LOW (ref 36.0–46.0)
Hemoglobin: 10.7 g/dL — ABNORMAL LOW (ref 12.0–15.0)
Immature Granulocytes: 1 %
Lymphocytes Relative: 20 %
Lymphs Abs: 1.7 K/uL (ref 0.7–4.0)
MCH: 30.1 pg (ref 26.0–34.0)
MCHC: 32.4 g/dL (ref 30.0–36.0)
MCV: 92.7 fL (ref 80.0–100.0)
Monocytes Absolute: 0.6 K/uL (ref 0.1–1.0)
Monocytes Relative: 7 %
Neutro Abs: 6.2 K/uL (ref 1.7–7.7)
Neutrophils Relative %: 71 %
Platelets: 198 K/uL (ref 150–400)
RBC: 3.56 MIL/uL — ABNORMAL LOW (ref 3.87–5.11)
RDW: 13.4 % (ref 11.5–15.5)
WBC: 8.5 K/uL (ref 4.0–10.5)
nRBC: 0 % (ref 0.0–0.2)

## 2024-07-02 LAB — COMPREHENSIVE METABOLIC PANEL WITH GFR
ALT: 29 U/L (ref 0–44)
AST: 34 U/L (ref 15–41)
Albumin: 3.1 g/dL — ABNORMAL LOW (ref 3.5–5.0)
Alkaline Phosphatase: 111 U/L (ref 38–126)
Anion gap: 9 (ref 5–15)
BUN: 22 mg/dL (ref 8–23)
CO2: 24 mmol/L (ref 22–32)
Calcium: 9 mg/dL (ref 8.9–10.3)
Chloride: 104 mmol/L (ref 98–111)
Creatinine, Ser: 0.85 mg/dL (ref 0.44–1.00)
GFR, Estimated: >60 mL/min (ref >60)
Glucose, Bld: 113 mg/dL — ABNORMAL HIGH (ref 70–99)
Potassium: 4.7 mmol/L (ref 3.5–5.1)
Sodium: 137 mmol/L (ref 135–145)
Total Bilirubin: 0.8 mg/dL (ref 0.0–1.2)
Total Protein: 5.6 g/dL — ABNORMAL LOW (ref 6.5–8.1)

## 2024-07-02 LAB — TROPONIN I (HIGH SENSITIVITY)
Troponin I (High Sensitivity): 573 ng/L (ref <18)
Troponin I (High Sensitivity): 443 ng/L (ref <18)

## 2024-07-02 LAB — BRAIN NATRIURETIC PEPTIDE: B Natriuretic Peptide: 1758.1 pg/mL — ABNORMAL HIGH (ref 0.0–100.0)

## 2024-07-02 MED ORDER — OXYCODONE HCL 5 MG PO TABS: 5.0000 mg | ORAL_TABLET | Freq: Once | ORAL | Status: DC

## 2024-07-02 MED ORDER — IOHEXOL 350 MG/ML SOLN
75.0000 mL | Freq: Once | INTRAVENOUS | Status: AC | PRN
  Administered 2024-07-02: 75 mL via INTRAVENOUS

## 2024-07-02 NOTE — ED Provider Notes (Signed)
 [place] EMERGENCY DEPARTMENT AT [facility] Provider Note   CSN: [id number] Arrival date & time: 07/02/24  8094     Patient presents with: Alison Brewer is a 89 y.o. female with dementia, minimally communicative at baseline, in hospice osteoporosis, HTN, hyperlipidemia presents following unwitnessed fall.  Patient lives at home with her grandson.  Reports that he left for  an hour, and came back to find her on the floor bleeding from her head.  Notes that she has not seemed ill over the past few days.      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 Authorizing Provider  albuterol  (VENTOLIN  HFA) 108 (90 Base) MCG/ACT inhaler Inhale 1-2 puffs into the lungs every 6 (six) hours as needed for wheezing or shortness of breath. 08/12/22   Stuart Vernell Norris, PA-C  alendronate  (FOSAMAX ) 70 MG tablet TAKE ONE TABLET BY MOUTH EVERY SEVEN DAYS WITH A FULL GLASS OF WATER ON AN EMPTY STOMACH 01/10/24   Caro Harlene POUR, NP  amLODipine  (NORVASC ) 2.5 MG tablet Take 1 tablet (2.5 mg total) by mouth daily. Hold if blood pressure less than 130 systolic Patient not taking: Reported on 06/16/2024 02/27/24 06/06/24  Christobal Guadalajara, MD  aspirin EC 81 MG tablet Take 81 mg by mouth daily. Swallow whole.    [provider]  Calcium Carb-Cholecalciferol (CALCIUM 600+D3 PO) Take 1 tablet by mouth 2 (two) times daily.    [provider]  ciclopirox  (PENLAC ) 8 % solution APPLY TOPICALLY AT BEDTIME. APPLY OVER NAIL AND SURROUNDING SKIN. APPLY DAILY OVER PREVIOUS COAT. AFTER SEVEN (7) DAYS, MAY REMOVE WITH ALCOHOL AND CONTINUE CYCLE. Patient taking differently:  Apply topically as needed. Apply over nail and surrounding skin. Apply daily over previous coat. After seven (7) days, may remove with alcohol and continue cycle. 01/09/24   Jackquline Sawyer, MD  ESTRING  7.5 MCG/24HR vaginal ring Place 1 each vaginally every 3 (three) months. 07/20/23   [provider]  memantine  (NAMENDA ) 10 MG tablet Take 1 tablet (10 mg total) by mouth 2 (two) times daily. 06/22/23   Wertman, Sara E, PA-C  Multiple Vitamin (MULTIVITAMIN) tablet Take 1 tablet by mouth daily.    [provider]    Allergies: Patient has no known allergies.    Review of Systems  Skin:  Positive for wound.    Updated Vital Signs BP (!) 184/81   Pulse 79   Resp 19   SpO2 100%   Physical Exam Vitals and nursing note reviewed.  Constitutional:      General: She is not in acute distress.    Appearance: She is well-developed.  HENT:     Head:     Comments: Head wound noted towards the occiput Eyes:     Conjunctiva/sclera: Conjunctivae normal.  Cardiovascular:     Rate and Rhythm: Normal rate and regular rhythm.     Heart sounds: No murmur heard. Pulmonary:     Effort: Pulmonary effort is normal. No respiratory distress.     Breath sounds: Normal breath sounds.  Abdominal:  Palpations: Abdomen is soft.     Tenderness: There is no abdominal tenderness.  Musculoskeletal:        General: No swelling.     Cervical back: Neck supple.  Skin:    General: Skin is warm and dry.     Capillary Refill: Capillary refill takes less than 2 seconds.  Neurological:     General: No focal deficit present.     Mental Status: She is alert.     Comments: Patient is minimally verbal at baseline  Psychiatric:        Mood and Affect: Mood normal.     (all labs ordered are listed, but only abnormal results are displayed) Labs Reviewed  CBC WITH DIFFERENTIAL/PLATELET - Abnormal; Notable for the following components:      Result Value   RBC 3.56 (*)    Hemoglobin 10.7 (*)    HCT  33.0 (*)    All other components within normal limits  COMPREHENSIVE METABOLIC PANEL WITH GFR - Abnormal; Notable for the following components:   Glucose, Bld 113 (*)    Total Protein 5.6 (*)    Albumin 3.1 (*)    All other components within normal limits  BRAIN NATRIURETIC PEPTIDE - Abnormal; Notable for the following components:   B Natriuretic Peptide 1,758.1 (*)    All other components within normal limits  TROPONIN I (HIGH SENSITIVITY) - Abnormal; Notable for the following components:   Troponin I (High Sensitivity) 573 (*)    All other components within normal limits  TROPONIN I (HIGH SENSITIVITY) - Abnormal; Notable for the following components:   Troponin I (High Sensitivity) 443 (*)    All other components within normal limits  URINALYSIS, ROUTINE W REFLEX MICROSCOPIC    EKG: EKG Interpretation Date/Time:  Wednesday July 02 2024 20:07:15 EDT Ventricular Rate:  72 PR Interval:  54 QRS Duration:  111 QT Interval:  426 QTC Calculation: 467 R Axis:   -59  Text Interpretation: Sinus rhythm Short PR interval Abnormal R-wave progression, early transition LVH with secondary repolarization abnormality Inferior infarct, old Confirmed by Jerrol Agent ([id number]) on 07/02/2024 9:36:27 PM  Radiology: CT Angio Chest PE W and/or Wo Contrast Result Date: 07/02/2024 CLINICAL DATA:  Evaluate for PE EXAM: CT ANGIOGRAPHY CHEST WITH CONTRAST TECHNIQUE: Multidetector CT imaging of the chest was performed using the standard protocol during bolus administration of intravenous contrast. Multiplanar CT image reconstructions and MIPs were obtained to evaluate the vascular anatomy. RADIATION DOSE REDUCTION: This exam was performed according to the departmental dose-optimization program which includes automated exposure control, adjustment of the mA and/or kV according to patient size and/or use of iterative reconstruction technique. CONTRAST:  75mL OMNIPAQUE  IOHEXOL  350 MG/ML SOLN COMPARISON:  None  Available. FINDINGS: Cardiovascular: There segmental and subsegmental pulmonary emboli within the right lower lobe. There segmental pulmonary emboli in the left upper lobe and left lower lobe. There is no central pulmonary embolism. The heart is enlarged. There is no pericardial effusion. Aorta is normal in size. There are atherosclerotic calcifications of the aorta and coronary arteries. Mediastinum/Nodes: Enlarged right hilar lymph nodes measure up to 11 mm. No other enlarged lymph nodes are seen. There subcentimeter hypodense bilateral thyroid  nodules. Esophagus is unremarkable. Lungs/Pleura: There is a small right pleural effusion. Biapical pleuroparenchymal scarring and calcifications are present. Multifocal ground-glass opacities are seen in the bilateral upper lobes. There is right lower lobe atelectasis. There is a calcified granuloma in the left lower lobe. No pneumothorax. Upper Abdomen: Cysts in the  liver measure up to 1 cm. Musculoskeletal: Degenerative changes affect the spine. Review of the MIP images confirms the above findings. IMPRESSION: 1. Bilateral segmental and subsegmental pulmonary emboli. Positive for acute PE with CTevidence of right heart strain (RV/LV Ratio = 1.0) consistent with at least submassive (intermediate risk) PE. The presence of right heart strain has been associated with an increased risk of morbidity and mortality. 2. Small right pleural effusion with right lower lobe atelectasis. 3. Multifocal ground-glass opacities in the bilateral upper lobes worrisome for infection or inflammation. 4. Cardiomegaly. Aortic Atherosclerosis (ICD10-I70.0). Electronically Signed   By: Greig Pique M.D.   On: 07/02/2024 22:11   CT Head Wo Contrast Result Date: 07/02/2024 CLINICAL DATA:  Head trauma EXAM: CT HEAD WITHOUT CONTRAST CT CERVICAL SPINE WITHOUT CONTRAST TECHNIQUE: Multidetector CT imaging of the head and cervical spine was performed following the standard protocol without  intravenous contrast. Multiplanar CT image reconstructions of the cervical spine were also generated. RADIATION DOSE REDUCTION: This exam was performed according to the departmental dose-optimization program which includes automated exposure control, adjustment of the mA and/or kV according to patient size and/or use of iterative reconstruction technique. COMPARISON:  Head CT 02/24/2024, head CT and cervical spine 01/09/2023 FINDINGS: CT HEAD FINDINGS Brain: No acute territorial infarction, hemorrhage or intracranial mass. Minimal atrophy. Moderate diffuse ventricular enlargement but stable. Chronic small vessel ischemic changes of the white matter. Vascular: No hyperdense vessels.  Carotid vascular calcification Skull: No fracture Sinuses/Orbits: Completely opacified right sphenoid sinus. Mild mucosal thickening in the ethmoid sinuses. Other: Moderate to large right parietal scalp hematoma CT CERVICAL SPINE FINDINGS Alignment: Reversal of cervical lordosis. Trace anterolisthesis C3 on C4 and C4 on C5. Facet alignment is within normal limits. Skull base and vertebrae: No acute fracture. No primary bone lesion or focal pathologic process. Soft tissues and spinal canal: No prevertebral fluid or swelling. No visible canal hematoma. Disc levels: Multilevel degenerative change. Advanced disc space narrowing C3-C4, C5-C6 and moderate disc space narrowing at C4-C5, C6-C7. Hypertrophic facet degenerative changes at multiple levels. Upper chest: Apical fibrosis. Other: None IMPRESSION: 1. No CT evidence for acute intracranial abnormality. Moderate to large right parietal scalp hematoma. 2. Reversal of cervical lordosis with multilevel degenerative change. No acute osseous abnormality. Electronically Signed   By: Luke Bun M.D.   On: 07/02/2024 20:42   CT Cervical Spine Wo Contrast Result Date: 07/02/2024 CLINICAL DATA:  Head trauma EXAM: CT HEAD WITHOUT CONTRAST CT CERVICAL SPINE WITHOUT CONTRAST TECHNIQUE:  Multidetector CT imaging of the head and cervical spine was performed following the standard protocol without intravenous contrast. Multiplanar CT image reconstructions of the cervical spine were also generated. RADIATION DOSE REDUCTION: This exam was performed according to the departmental dose-optimization program which includes automated exposure control, adjustment of the mA and/or kV according to patient size and/or use of iterative reconstruction technique. COMPARISON:  Head CT 02/24/2024, head CT and cervical spine 01/09/2023 FINDINGS: CT HEAD FINDINGS Brain: No acute territorial infarction, hemorrhage or intracranial mass. Minimal atrophy. Moderate diffuse ventricular enlargement but stable. Chronic small vessel ischemic changes of the white matter. Vascular: No hyperdense vessels.  Carotid vascular calcification Skull: No fracture Sinuses/Orbits: Completely opacified right sphenoid sinus. Mild mucosal thickening in the ethmoid sinuses. Other: Moderate to large right parietal scalp hematoma CT CERVICAL SPINE FINDINGS Alignment: Reversal of cervical lordosis. Trace anterolisthesis C3 on C4 and C4 on C5. Facet alignment is within normal limits. Skull base and vertebrae: No acute fracture. No primary bone lesion or  focal pathologic process. Soft tissues and spinal canal: No prevertebral fluid or swelling. No visible canal hematoma. Disc levels: Multilevel degenerative change. Advanced disc space narrowing C3-C4, C5-C6 and moderate disc space narrowing at C4-C5, C6-C7. Hypertrophic facet degenerative changes at multiple levels. Upper chest: Apical fibrosis. Other: None IMPRESSION: 1. No CT evidence for acute intracranial abnormality. Moderate to large right parietal scalp hematoma. 2. Reversal of cervical lordosis with multilevel degenerative change. No acute osseous abnormality. Electronically Signed   By: Luke Bun M.D.   On: 07/02/2024 20:42   DG Pelvis Portable Result Date: 07/02/2024 CLINICAL DATA:   Recent fall with pelvic pain, initial encounter EXAM: PORTABLE PELVIS 1 VIEWS COMPARISON:  None Available. FINDINGS: Pelvic ring is intact. Mild degenerative changes of left hip joint are seen. No acute fracture or dislocation is noted. Degenerative change of the lumbar spine is noted. No soft tissue changes are seen. IMPRESSION: No acute abnormality noted. Electronically Signed   By: Oneil Devonshire M.D.   On: 07/02/2024 20:00   DG Chest Portable 1 View Result Date: 07/02/2024 CLINICAL DATA:  Recent fall with chest pain EXAM: PORTABLE CHEST 1 VIEW COMPARISON:  02/24/2024 FINDINGS: Cardiac shadow is mildly enlarged but stable. Lungs are well aerated bilaterally. Mild central vascular congestion is noted without edema. No focal infiltrate or effusion is noted. No bony abnormality is seen. IMPRESSION: Mild central vascular congestion. Electronically Signed   By: Oneil Devonshire M.D.   On: 07/02/2024 19:59     Procedures   Medications Ordered in the ED  iohexol  (OMNIPAQUE ) 350 MG/ML injection 75 mL (75 mLs Intravenous Contrast Given 07/02/24 2158)    Clinical Course as of 07/02/24 2300  Wed Jul 02, 2024  2041 ECG Heart Rate: 73 [JT]    Clinical Course User Index [JT] Donnajean Lynwood DEL, PA-C                                 Medical Decision Making Amount and/or Complexity of Data Reviewed Labs: ordered. Radiology: ordered.  Risk Prescription drug management.   This patient presents to the ED with chief complaint(s) of fall.  The complaint involves an extensive differential diagnosis and also carries with it a high risk of complications and morbidity.   Pertinent past medical history as listed in HPI  The differential diagnosis includes  Intracranial hemorrhage, fracture, dislocation, intrathoracic/abdominal injury, syncope, ACS, PE, pneumonia, UTI, sepsis Additional history obtained: Records reviewed Care Everywhere/External Records  Assessment and management:   Hemodynamically stable,  nontoxic-appearing patient with baseline dementia, predominantly nonverbal presents following unwitnessed fall.  Was found by her grandson after leaving for an hour.  She was found on the ground with an obvious head wound.  Patient is not endorsing any complaints.  Her CT imaging is negative for any intracranial abnormality.  Large hematoma noted.  Chest and pelvic x-rays were negative.  No leukocytosis or evidence of active infection at this time.  Does notably have a elevated troponin will trend.  CT angio study added to evaluate for PE in the potential setting of syncope.  CT study notable for bilateral segmental PE, with evidence of right heart strain, consistent with likely submassive PE, small right pleural effusion, multifocal gas opacities in the bilateral upper lobes worrisome for infection or inflammation.   Her son reports that she has no URI symptoms.  She is afebrile.  Do not suspect pneumonia at this time.  Discussed with her  grandson who is her primary caretaker, question was had with her hospice care.  Agreed to initiate heparin .  Patient does have a skin abrasion on her scalp that is not amenable to sutures or staples.  Will apply quick clot and pressure dressing.  Independent ECG interpretation:  Sinus rhythm with T wave abnormality, unchanged  Independent labs interpretation:  The following labs were independently interpreted:  CBC and CMP without significant abnormality, troponin notably elevated to 570, second down to 443, BNP elevated 1700  Independent visualization and interpretation of imaging: I independently visualized the following imaging with scope of interpretation limited to determining acute life threatening conditions related to emergency care:  CT head/ cervical spine no evidence of acute intracranial abnormality.  Moderate to large right parietal scalp hematoma, loss of normal cervical lordosis, no acute osseous abnormality X-ray with mild central vascular  congestion Pelvic x-ray negative  Consultations obtained:   Critical care Dr. Dub will assess  Disposition:   Patient will be admitted for further workup and management Ileana Eck, PA-C made aware of patient  Social Determinants of Health:   none  This note was dictated with voice recognition software.  Despite best efforts at proofreading, errors may have occurred which can change the documentation meaning.       Final diagnoses:  Fall, initial encounter  Other pulmonary embolism with acute cor pulmonale, unspecified chronicity [facility])    ED Discharge Orders     None          Donnajean Lynwood VEAR DEVONNA 07/02/24 2341    Jerrol Lynwood, MD 07/03/24 1122

## 2024-07-02 NOTE — ED Notes (Signed)
 Pt changed and cleaned.

## 2024-07-02 NOTE — ED Notes (Addendum)
 RN called Consuelo, RN to update. She does not have an advance directive on file for hospice. NO DNR on file right now.

## 2024-07-02 NOTE — ED Triage Notes (Signed)
 PT from home. Family left her for  an hour. They found her on floor when they got home. Blood to back of head. Not on thinners. Baseline per family. VSS

## 2024-07-02 NOTE — ED Notes (Signed)
 Patient is incontinent and was changed from wet to dry.

## 2024-07-02 NOTE — Consult Note (Signed)
 NAME:  Alison Brewer, MRN:  [id number], DOB:  1935/04/24, LOS: 0 ADMISSION DATE:  07/02/2024, CONSULTATION DATE:  7/16 REFERRING MD:  Dr. Jerrol EDP, CHIEF COMPLAINT:  PE   History of Present Illness:  89 year old female with PMH as below, which is significant for HTN, chronic kidney disease, HFrEF (LVEF 25%) and dementia. Minimally communicative at baseline per reports. She has a recent history of frequent hospital visits for syncope and orthostasis. Improved with IV fluids in the ED.  Additionally she is followed by hospice services in the outpatient setting, although she did request full code at her office visit in April. She presented to [facility] ED 7/16 following a fall with head laceration and hematoma. Had been acting normally otherwise. Head CT negative. Syncope workup included CT angiogram of the chest which was positive for bilateral segmental and subsegmental PE with RV/LV 1.0. She remained hemodynamically stable and required 2L of oxygen. PCCM was asked to evaluate further.   Pertinent  Medical History   has a past medical history of Actinic keratosis, Arthritis, Fibrocystic breast disease, Frequent headaches, History of squamous cell carcinoma (12/07/2010), HTN (hypertension), Hyperlipidemia, and Mild cardiomegaly.   Significant Hospital Events: Including procedures, antibiotic start and stop dates in addition to other pertinent events     Interim History / Subjective:  ***  Objective    Blood pressure (!) 162/118, pulse 73, resp. rate (!) 23, SpO2 100%.       No intake or output data in the 24 hours ending 07/02/24 2316 There were no vitals filed for this visit.  Examination: General: *** HENT: *** Lungs: *** Cardiovascular: *** Abdomen: *** Extremities: *** Neuro: *** GU: ***  BNP: 1758 Troponin: 573 > 441  Resolved problem list    Assessment and Plan   Pulmonary emboli: bilateral segmental and subsegemental clot burden with RV/LV ratio 1. BNP 1758 and  troponin 573 down trending to 441. She is requiring 2L nasal cannula and is hemodynamically stable.   Best Practice (right click and Reselect all SmartList Selections daily)   Diet/type: {diet type:25684} DVT prophylaxis {anticoagulation:25687} Pressure ulcer(s): {pressure ulcer(s):31683} GI prophylaxis: {HP:73065} Lines: {Central Venous Access:25771} Foley:  {Central Venous Access:25691} Code Status:  {Code Status:26939} Last date of multidisciplinary goals of care discussion [***]  Labs   CBC: Recent Labs  Lab 07/02/24 2011  WBC 8.5  NEUTROABS 6.2  HGB 10.7*  HCT 33.0*  MCV 92.7  PLT 198    Basic Metabolic Panel: Recent Labs  Lab 07/02/24 2011  NA 137  K 4.7  CL 104  CO2 24  GLUCOSE 113*  BUN 22  CREATININE 0.85  CALCIUM 9.0   GFR: CrCl cannot be calculated (Unknown ideal weight.). Recent Labs  Lab 07/02/24 2011  WBC 8.5    Liver Function Tests: Recent Labs  Lab 07/02/24 2011  AST 34  ALT 29  ALKPHOS 111  BILITOT 0.8  PROT 5.6*  ALBUMIN 3.1*   No results for input(s): LIPASE, AMYLASE in the last 168 hours. No results for input(s): AMMONIA in the last 168 hours.  ABG No results found for: PHART, PCO2ART, PO2ART, HCO3, TCO2, ACIDBASEDEF, O2SAT   Coagulation Profile: No results for input(s): INR, PROTIME in the last 168 hours.  Cardiac Enzymes: No results for input(s): CKTOTAL, CKMB, CKMBINDEX, TROPONINI in the last 168 hours.  HbA1C: No results found for: HGBA1C  CBG: No results for input(s): GLUCAP in the last 168 hours.  Review of Systems:   ***  Past Medical History:  She,  has a past medical history of Actinic keratosis, Arthritis, Fibrocystic breast disease, Frequent headaches, History of squamous cell carcinoma (12/07/2010), HTN (hypertension), Hyperlipidemia, and Mild cardiomegaly.   Surgical History:   Past Surgical History:  Procedure Laterality Date   BREAST BIOPSY Right 1990   neg      Social History:   reports that she quit smoking about 45 years ago. Her smoking use included cigarettes. She started smoking about 63 years ago. She has a 27 pack-year smoking history. She has never used smokeless tobacco. She reports that she does not drink alcohol and does not use drugs.   Family History:  Her family history includes Breast cancer in her maternal aunt; Cancer in her brother; Coronary artery disease in her father; Pancreatic cancer in her mother.   Allergies No Known Allergies   Home Medications  Prior to Admission medications   Medication Sig Start Date End Date Taking? Authorizing Provider  albuterol  (VENTOLIN  HFA) 108 (90 Base) MCG/ACT inhaler Inhale 1-2 puffs into the lungs every 6 (six) hours as needed for wheezing or shortness of breath. 08/12/22   Stuart Vernell Norris, PA-C  alendronate  (FOSAMAX ) 70 MG tablet TAKE ONE TABLET BY MOUTH EVERY SEVEN DAYS WITH A FULL GLASS OF WATER ON AN EMPTY STOMACH 01/10/24   Caro Harlene POUR, NP  amLODipine  (NORVASC ) 2.5 MG tablet Take 1 tablet (2.5 mg total) by mouth daily. Hold if blood pressure less than 130 systolic Patient not taking: Reported on 06/16/2024 02/27/24 06/06/24  Christobal Guadalajara, MD  aspirin EC 81 MG tablet Take 81 mg by mouth daily. Swallow whole.    [provider]  Calcium Carb-Cholecalciferol (CALCIUM 600+D3 PO) Take 1 tablet by mouth 2 (two) times daily.    [provider]  ciclopirox  (PENLAC ) 8 % solution APPLY TOPICALLY AT BEDTIME. APPLY OVER NAIL AND SURROUNDING SKIN. APPLY DAILY OVER PREVIOUS COAT. AFTER SEVEN (7) DAYS, MAY REMOVE WITH ALCOHOL AND CONTINUE CYCLE. Patient taking differently: Apply topically as needed. Apply over nail and surrounding skin. Apply daily over previous coat. After seven (7) days, may remove with alcohol and continue cycle. 01/09/24   Jackquline Sawyer, MD  ESTRING  7.5 MCG/24HR vaginal ring Place 1 each vaginally every 3 (three) months. 07/20/23   [provider]   memantine  (NAMENDA ) 10 MG tablet Take 1 tablet (10 mg total) by mouth 2 (two) times daily. 06/22/23   Wertman, Sara E, PA-C  Multiple Vitamin (MULTIVITAMIN) tablet Take 1 tablet by mouth daily.    [provider]     Critical care time: ***

## 2024-07-03 ENCOUNTER — Inpatient Hospital Stay (HOSPITAL_COMMUNITY)

## 2024-07-03 ENCOUNTER — Inpatient Hospital Stay (HOSPITAL_COMMUNITY)
Admit: 2024-07-03 | Discharge: 2024-07-03 | Disposition: A | Discharge status: Home / Self Care | Attending: Internal Medicine | Admitting: Internal Medicine

## 2024-07-03 DIAGNOSIS — L89152 Pressure ulcer of sacral region, stage 2: Secondary | ICD-10-CM | POA: Diagnosis present

## 2024-07-03 DIAGNOSIS — Z803 Family history of malignant neoplasm of breast: Secondary | ICD-10-CM | POA: Diagnosis not present

## 2024-07-03 DIAGNOSIS — F05 Delirium due to known physiological condition: Secondary | ICD-10-CM | POA: Diagnosis present

## 2024-07-03 DIAGNOSIS — Y92019 Unspecified place in single-family (private) house as the place of occurrence of the external cause: Secondary | ICD-10-CM | POA: Diagnosis not present

## 2024-07-03 DIAGNOSIS — I5020 Unspecified systolic (congestive) heart failure: Secondary | ICD-10-CM

## 2024-07-03 DIAGNOSIS — Z8249 Family history of ischemic heart disease and other diseases of the circulatory system: Secondary | ICD-10-CM | POA: Diagnosis not present

## 2024-07-03 DIAGNOSIS — S0101XA Laceration without foreign body of scalp, initial encounter: Secondary | ICD-10-CM | POA: Diagnosis present

## 2024-07-03 DIAGNOSIS — I10 Essential (primary) hypertension: Secondary | ICD-10-CM | POA: Diagnosis not present

## 2024-07-03 DIAGNOSIS — H9190 Unspecified hearing loss, unspecified ear: Secondary | ICD-10-CM | POA: Diagnosis present

## 2024-07-03 DIAGNOSIS — Z87891 Personal history of nicotine dependence: Secondary | ICD-10-CM | POA: Diagnosis not present

## 2024-07-03 DIAGNOSIS — R55 Syncope and collapse: Secondary | ICD-10-CM | POA: Diagnosis present

## 2024-07-03 DIAGNOSIS — W19XXXA Unspecified fall, initial encounter: Secondary | ICD-10-CM

## 2024-07-03 DIAGNOSIS — Z7901 Long term (current) use of anticoagulants: Secondary | ICD-10-CM | POA: Diagnosis not present

## 2024-07-03 DIAGNOSIS — Z9181 History of falling: Secondary | ICD-10-CM | POA: Diagnosis not present

## 2024-07-03 DIAGNOSIS — Z515 Encounter for palliative care: Secondary | ICD-10-CM | POA: Diagnosis not present

## 2024-07-03 DIAGNOSIS — I110 Hypertensive heart disease with heart failure: Secondary | ICD-10-CM | POA: Diagnosis present

## 2024-07-03 DIAGNOSIS — I2602 Saddle embolus of pulmonary artery with acute cor pulmonale: Secondary | ICD-10-CM

## 2024-07-03 DIAGNOSIS — I2609 Other pulmonary embolism with acute cor pulmonale: Secondary | ICD-10-CM | POA: Diagnosis not present

## 2024-07-03 DIAGNOSIS — I5022 Chronic systolic (congestive) heart failure: Secondary | ICD-10-CM | POA: Diagnosis present

## 2024-07-03 DIAGNOSIS — Z7983 Long term (current) use of bisphosphonates: Secondary | ICD-10-CM | POA: Diagnosis not present

## 2024-07-03 DIAGNOSIS — Z7982 Long term (current) use of aspirin: Secondary | ICD-10-CM | POA: Diagnosis not present

## 2024-07-03 DIAGNOSIS — I2699 Other pulmonary embolism without acute cor pulmonale: Secondary | ICD-10-CM

## 2024-07-03 DIAGNOSIS — Z66 Do not resuscitate: Secondary | ICD-10-CM | POA: Diagnosis present

## 2024-07-03 DIAGNOSIS — F0280 Dementia in other diseases classified elsewhere without behavioral disturbance: Secondary | ICD-10-CM | POA: Diagnosis present

## 2024-07-03 DIAGNOSIS — Z800 Family history of malignant neoplasm of digestive organs: Secondary | ICD-10-CM | POA: Diagnosis not present

## 2024-07-03 DIAGNOSIS — G309 Alzheimer's disease, unspecified: Secondary | ICD-10-CM

## 2024-07-03 DIAGNOSIS — E785 Hyperlipidemia, unspecified: Secondary | ICD-10-CM | POA: Diagnosis present

## 2024-07-03 DIAGNOSIS — Z7401 Bed confinement status: Secondary | ICD-10-CM | POA: Diagnosis not present

## 2024-07-03 DIAGNOSIS — M7989 Other specified soft tissue disorders: Secondary | ICD-10-CM | POA: Diagnosis not present

## 2024-07-03 DIAGNOSIS — R296 Repeated falls: Secondary | ICD-10-CM | POA: Diagnosis present

## 2024-07-03 DIAGNOSIS — D631 Anemia in chronic kidney disease: Secondary | ICD-10-CM | POA: Diagnosis present

## 2024-07-03 DIAGNOSIS — Z79899 Other long term (current) drug therapy: Secondary | ICD-10-CM | POA: Diagnosis not present

## 2024-07-03 DIAGNOSIS — R531 Weakness: Secondary | ICD-10-CM | POA: Diagnosis not present

## 2024-07-03 LAB — ECHOCARDIOGRAM COMPLETE
Area-P 1/2: 4.12 cm2
Calc EF: 37.2 %
Height: 65 in
S' Lateral: 3.5 cm
Single Plane A2C EF: 36.4 %
Single Plane A4C EF: 36.6 %
Weight: 2010.6 [oz_av]

## 2024-07-03 LAB — HEPARIN LEVEL (UNFRACTIONATED): Heparin Unfractionated: 0.25 [IU]/mL — ABNORMAL LOW (ref 0.30–0.70)

## 2024-07-03 MED ORDER — HYDRALAZINE HCL 20 MG/ML IJ SOLN: 10.0000 mg | Freq: Four times a day (QID) | INTRAMUSCULAR | Status: DC | PRN

## 2024-07-03 MED ORDER — ALBUTEROL SULFATE (2.5 MG/3ML) 0.083% IN NEBU: 2.5000 mg | INHALATION_SOLUTION | RESPIRATORY_TRACT | Status: DC | PRN

## 2024-07-03 MED ORDER — BISACODYL 10 MG RE SUPP: 10.0000 mg | Freq: Every day | RECTAL | Status: DC | PRN

## 2024-07-03 MED ORDER — HEPARIN (PORCINE) 25000 UT/250ML-% IV SOLN
1200.0000 [IU]/h | INTRAVENOUS | Status: DC
  Administered 2024-07-03: 1000 [IU]/h via INTRAVENOUS
  Administered 2024-07-04: 1150 [IU]/h via INTRAVENOUS
  Filled 2024-07-03 (×2): qty 250

## 2024-07-03 MED ORDER — QUETIAPINE FUMARATE 25 MG PO TABS
25.0000 mg | ORAL_TABLET | Freq: Two times a day (BID) | ORAL | Status: DC
  Administered 2024-07-03 – 2024-07-04 (×2): 25 mg via ORAL
  Filled 2024-07-03 (×2): qty 1

## 2024-07-03 MED ORDER — ONDANSETRON HCL 4 MG/2ML IJ SOLN: 4.0000 mg | Freq: Four times a day (QID) | INTRAMUSCULAR | Status: DC | PRN

## 2024-07-03 MED ORDER — SODIUM CHLORIDE 0.9% FLUSH: 3.0000 mL | INTRAVENOUS | Status: DC | PRN

## 2024-07-03 MED ORDER — HALOPERIDOL LACTATE 5 MG/ML IJ SOLN: 2.0000 mg | Freq: Four times a day (QID) | INTRAMUSCULAR | Status: DC | PRN

## 2024-07-03 MED ORDER — SODIUM CHLORIDE 0.9 % IV SOLN: 250.0000 mL | INTRAVENOUS | Status: AC | PRN

## 2024-07-03 MED ORDER — ONDANSETRON HCL 4 MG PO TABS: 4.0000 mg | ORAL_TABLET | Freq: Four times a day (QID) | ORAL | Status: DC | PRN

## 2024-07-03 MED ORDER — POLYETHYLENE GLYCOL 3350 17 G PO PACK
17.0000 g | PACK | Freq: Every day | ORAL | Status: DC
  Administered 2024-07-04 – 2024-07-05 (×2): 17 g via ORAL
  Filled 2024-07-03 (×2): qty 1

## 2024-07-03 MED ORDER — ACETAMINOPHEN 650 MG RE SUPP: 650.0000 mg | Freq: Four times a day (QID) | RECTAL | Status: DC | PRN

## 2024-07-03 MED ORDER — ACETAMINOPHEN 325 MG PO TABS: 650.0000 mg | ORAL_TABLET | Freq: Four times a day (QID) | ORAL | Status: DC | PRN

## 2024-07-03 MED ORDER — MEMANTINE HCL 10 MG PO TABS
10.0000 mg | ORAL_TABLET | Freq: Two times a day (BID) | ORAL | Status: DC
  Administered 2024-07-03 – 2024-07-05 (×4): 10 mg via ORAL
  Filled 2024-07-03 (×4): qty 1

## 2024-07-03 MED ORDER — SODIUM CHLORIDE 0.9% FLUSH
3.0000 mL | Freq: Two times a day (BID) | INTRAVENOUS | Status: DC
  Administered 2024-07-03 – 2024-07-05 (×5): 3 mL via INTRAVENOUS

## 2024-07-03 MED ORDER — MELATONIN 5 MG PO TABS
10.0000 mg | ORAL_TABLET | Freq: Every day | ORAL | Status: DC
  Administered 2024-07-03 – 2024-07-04 (×2): 10 mg via ORAL
  Filled 2024-07-03 (×2): qty 2

## 2024-07-03 MED ORDER — HEPARIN BOLUS VIA INFUSION
1000.0000 [IU] | Freq: Once | INTRAVENOUS | Status: AC
  Administered 2024-07-03: 1000 [IU] via INTRAVENOUS
  Filled 2024-07-03: qty 1000

## 2024-07-03 NOTE — ED Notes (Signed)
 Speech therapy at bedside.

## 2024-07-03 NOTE — Progress Notes (Signed)
 PHARMACY - ANTICOAGULATION CONSULT NOTE  Pharmacy Consult for IV heparin  Indication: pulmonary embolus  No Known Allergies  Patient Measurements: Height: 5' 5 (165.1 cm) Weight: 57 kg (125 lb 10.6 oz) IBW/kg (Calculated) : 57 HEPARIN  DW (KG): 57  Vital Signs: Temp: 98.6 F (37 C) (07/17 0729) Temp Source: Oral (07/17 0729) BP: 165/69 (07/17 0729) Pulse Rate: 70 (07/17 0729)  Labs: Recent Labs    07/02/24 2011 07/02/24 2133  HGB 10.7*  --   HCT 33.0*  --   PLT 198  --   CREATININE 0.85  --   TROPONINIHS 573* 443*    Estimated Creatinine Clearance: 42 mL/min (by C-G formula based on SCr of 0.85 mg/dL).   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 year old female admitted on 07/02/2024 with concern for PE. CTA with bilateral segmental and subsegmental PE (+ RHS). No anticoagulation prior to admission per dispense history. Of note, initially presented as trauma with scalp laceration and hematoma, no ICH per CTH. Pharmacy consulted to dose heparin  with no bolus given bleeding risk.  Goal of Therapy:  Heparin  level 0.3-0.7 units/ml Monitor platelets by anticoagulation protocol: Yes   Plan:  Heparin  infusion at 1000 units/hr 8h heparin  level  Daily heparin  level, CBC, and monitoring for bleeding F/u plans for transition to oral anticoagulation   Thank you for allowing pharmacy to participate in this patient's care.  Leonor GORMAN Bash, PharmD Emergency Medicine Clinical Pharmacist 07/03/2024,8:26 AM

## 2024-07-03 NOTE — Progress Notes (Signed)
 Echocardiogram 2D Echocardiogram has been performed.  Alison Brewer [name] RDCS 07/03/2024, 11:22 AM

## 2024-07-03 NOTE — ED Provider Notes (Incomplete)
  Physical Exam  BP (!) 178/70   Pulse 72   Temp (!) 97.5 F (36.4 C) (Oral)   Resp 17   SpO2 98%   Physical Exam  Procedures  Procedures  ED Course / MDM   Clinical Course as of 07/03/24 0002  Wed Jul 02, 2024  2041 ECG Heart Rate: 73 [JT]    Clinical Course User Index [JT] Donnajean Lynwood DEL, PA-C   Medical Decision Making Amount and/or Complexity of Data Reviewed Labs: ordered. Radiology: ordered.  Risk Prescription drug management. Decision regarding hospitalization.   ***

## 2024-07-03 NOTE — ED Provider Notes (Signed)
 Patient signed out to myself from Lynwood Roosevelt, PA-C pending hospitalist admission.  Please refer to their note for full HPI, ROS, PE, and MDM.  Contacted patient's son, Fonda who states that his mother is a full code.  Critical care felt that the patient was likely not a good thrombectomy candidate or Eliquis candidate given her history of falls.  Consulted hospitalist, Dr. Loreta who is agreeable to admission, however she would not be able to see the patient before day team came on due to the surplus of admissions.  Day team will admit patient once they are on shift.     Francis Ileana SAILOR, PA-C 07/03/24 0112    Carita Senior, MD 07/03/24 [phone]

## 2024-07-03 NOTE — ED Notes (Addendum)
 Pt drank cup full of apple juice and changed.

## 2024-07-03 NOTE — ED Notes (Signed)
 Urinary occurrence. Pt bed changed, new gown, new brief. Warm blankets applied.

## 2024-07-03 NOTE — ED Notes (Signed)
 Pt's sats maintain well at 98-100 percent on 2 L [place]. Drop to 86-88 on RA.

## 2024-07-03 NOTE — ED Notes (Signed)
 Patient is incontinent. I changed her from wet to dry.

## 2024-07-03 NOTE — ED Notes (Signed)
 Echo at bedside

## 2024-07-03 NOTE — ED Notes (Signed)
 Pt in bed, pt arouses easily to verbal stim, pt is confused, pt moves extremities, resps even and unlabored, pt offers no complaints and has no requests at this time, bed in low position, bed rails up, call bell within reach

## 2024-07-03 NOTE — ED Notes (Signed)
 Staffing called for sitter, no sitter available at this time, charge rn notified, pt near nurses station in view.

## 2024-07-03 NOTE — Plan of Care (Signed)
     Referral previously received for Alison Brewer for goals of care discussion. Chart reviewed and the patient is currently active with [facility].  I reached out to [facility] liaison Alison Brewer who states that she is seeing the patient and is engaged with the grandson.  At this point hospice will see the patient in the hospital.  They will engage with us  if our services are needed.  At this point palliative medicine will sign off unless we are contacted for further assistance.  Thank you for your referral and allowing PMT to assist in Alison Brewer's care.   Camellia Kays, NP Palliative Medicine Team Phone: [phone]  NO CHARGE

## 2024-07-03 NOTE — Progress Notes (Signed)
 PHARMACY - ANTICOAGULATION CONSULT NOTE  Pharmacy Consult for IV heparin  Indication: pulmonary embolus  No Known Allergies  Patient Measurements: Height: 5' 5 (165.1 cm) Weight: 57 kg (125 lb 10.6 oz) IBW/kg (Calculated) : 57 HEPARIN  DW (KG): 57  Vital Signs: Temp: 98 F (36.7 C) (07/17 1550) Temp Source: Temporal (07/17 1118) BP: 130/84 (07/17 1730) Pulse Rate: 67 (07/17 1730)  Labs: Recent Labs    07/02/24 2011 07/02/24 2133 07/03/24 1614  HGB 10.7*  --   --   HCT 33.0*  --   --   PLT 198  --   --   HEPARINUNFRC  --   --  0.25*  CREATININE 0.85  --   --   TROPONINIHS 573* 443*  --     Estimated Creatinine Clearance: 42 mL/min (by C-G formula based on SCr of 0.85 mg/dL).   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 year old female admitted on 07/02/2024 with concern for PE. CTA with bilateral segmental and subsegmental PE (+ RHS). No anticoagulation prior to admission per dispense history. Of note, initially presented as trauma with scalp laceration and hematoma, no ICH per CTH. Pharmacy consulted to dose heparin .   Heparin  level subtherapeutic at 0.25 at 1000 units/hr.   Goal of Therapy:  Heparin  level 0.3-0.7 units/ml Monitor platelets by anticoagulation protocol: Yes   Plan:  Initiate heparin  1000 unit bolus Increase heparin  to 1150 units/hr 8h heparin  level  Daily heparin  level, CBC, and monitoring for bleeding F/u plans for transition to oral anticoagulation   Thank you for allowing pharmacy to participate in this patient's care.  Izetta Carl, PharmD PGY1 Pharmacy Resident [facility]  07/03/2024 6:22 PM

## 2024-07-03 NOTE — ED Notes (Signed)
Pt in bed with eyes closed, resps even and unlabored.  

## 2024-07-03 NOTE — Progress Notes (Signed)
 Bilateral lower extremity venous duplex has been completed. Preliminary results can be found in CV Proc through chart review.   07/03/24 12:23 PM Cathlyn Collet RVT

## 2024-07-03 NOTE — H&P (Signed)
 History and Physical    Patient: Alison Brewer FMW:[id number] DOB: [birth year] DOA: 07/02/2024 DOS: the patient was seen and examined on 07/03/2024 PCP: Caro Harlene POUR, NP  Patient coming from: Home  Chief Complaint:  Chief Complaint  Patient presents with   Fall   HPI: Alison Brewer is a 89 y.o. female with medical history significant of dementia, HFrEF, HTN, HLD-who presented to the ED following a fall/syncopal episode.  Please note-patient appears to have advanced dementia-she is oriented with her name-and is not able to provide any further history.  History is obtained after reviewing ED chart-speaking with ED RN-and with patient's grandson Alison Brewer over the phone.  Patient lives with her grandson and her family.  She appears to have advanced dementia-at baseline-she is very confused and has episodes of agitation even at home.  Apparently family left her home alone for an hour, when they got home- they found her on the floor bleeding from her head.  She was subsequently brought to the ED-where she underwent neuroimaging which was negative for any significant intracranial issues but it did show a large parietal hematoma, she also underwent a CT angiogram of her chest which was positive for PE.  ED physician discussed with grandson-initially-he wanted patient to be a full code-subsequently was evaluated by PCCM and not felt to be a candidate for long-term anticoagulation or thrombectomy.  Hospitalist service was then asked to admit this patient for further evaluation and treatment.  I subsequently reached out to the patient's grandson-Alison Brewer earlier this morning-extensive discussion over the phone-he understands this difficult situation-complicated issue of frail elderly patient with dementia and frequent falls-now with pulmonary embolism requiring anticoagulation.  After extensive discussion-he is agreeable to a DNR order-temporarily-while he awaits further tests/decision making-he is okay with  starting anticoagulation.  He is aware that even in the hospital-patient could have a catastrophic fall with anticoagulation on board resulting in significant life-threatening or life disabling situation.  He is accepting this risk.    Review of Systems: unable to review all systems due to the inability of the patient to answer questions. 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 Her smoking use included cigarettes. She started smoking about 63 years ago. She has a 27 pack-year smoking history. She has never used smokeless tobacco. She reports that she does not drink alcohol and does not use drugs.  No Known Allergies  Family History  Problem Relation Age of Onset   Pancreatic cancer Mother    Coronary artery disease Father    Cancer Brother    Breast cancer Maternal Aunt     Prior to Admission medications   Medication Sig Start Date End Date Taking? Authorizing Provider  albuterol  (VENTOLIN  HFA) 108 (90 Base) MCG/ACT inhaler Inhale 1-2 puffs into the lungs every 6 (six) hours as needed for wheezing or shortness of breath. 08/12/22   Stuart Vernell Norris, PA-C  alendronate  (FOSAMAX ) 70 MG tablet TAKE ONE TABLET BY MOUTH EVERY SEVEN DAYS WITH A FULL GLASS OF WATER ON AN EMPTY STOMACH 01/10/24  Caro Harlene POUR, NP  amLODipine  (NORVASC ) 2.5 MG tablet Take 1 tablet (2.5 mg total) by mouth daily. Hold if blood pressure less than 130 systolic Patient not taking: Reported on 06/16/2024 02/27/24 06/06/24  Christobal Guadalajara, MD  aspirin EC 81 MG tablet Take 81 mg by mouth daily. Swallow whole.    [provider]   Calcium Carb-Cholecalciferol (CALCIUM 600+D3 PO) Take 1 tablet by mouth 2 (two) times daily.    [provider]  ciclopirox  (PENLAC ) 8 % solution APPLY TOPICALLY AT BEDTIME. APPLY OVER NAIL AND SURROUNDING SKIN. APPLY DAILY OVER PREVIOUS COAT. AFTER SEVEN (7) DAYS, MAY REMOVE WITH ALCOHOL AND CONTINUE CYCLE. Patient taking differently: Apply topically as needed. Apply over nail and surrounding skin. Apply daily over previous coat. After seven (7) days, may remove with alcohol and continue cycle. 01/09/24   Jackquline Sawyer, MD  ESTRING  7.5 MCG/24HR vaginal ring Place 1 each vaginally every 3 (three) months. 07/20/23   [provider]  memantine  (NAMENDA ) 10 MG tablet Take 1 tablet (10 mg total) by mouth 2 (two) times daily. 06/22/23   Wertman, Sara E, PA-C  Multiple Vitamin (MULTIVITAMIN) tablet Take 1 tablet by mouth daily.    [provider]  senna (SENOKOT) 8.6 MG TABS tablet Take 2 tablets by mouth 2 (two) times daily. 07/02/24   [provider]    Physical Exam: Vitals:   07/03/24 0300 07/03/24 0315 07/03/24 0345 07/03/24 0729  BP: (!) 156/75 (!) 163/74 (!) 157/72 (!) 165/69  Pulse: 74 74 73 70  Resp: 13 13 16 17   Temp:   98.4 F (36.9 C) 98.6 F (37 C)  TempSrc:   Oral Oral  SpO2: 95% 98% 100% 100%    Data Reviewed:    Latest Ref Rng & Units 07/02/2024    8:11 PM 04/05/2024    5:00 PM 03/27/2024    2:27 PM  CBC  WBC 4.0 - 10.5 K/uL 8.5  8.9  7.5   Hemoglobin 12.0 - 15.0 g/dL 89.2  88.8  88.3   Hematocrit 36.0 - 46.0 % 33.0  35.4  36.0   Platelets 150 - 400 K/uL 198  210  219        Latest Ref Rng & Units 07/02/2024    8:11 PM 04/05/2024    5:00 PM 03/27/2024    2:27 PM  BMP  Glucose 70 - 99 mg/dL 886  880  99   BUN 8 - 23 mg/dL 22  27  27    Creatinine 0.44 - 1.00 mg/dL 9.14  8.94  8.92   Sodium 135 - 145 mmol/L 137  138  137   Potassium 3.5 - 5.1 mmol/L 4.7  4.7  4.5   Chloride 98 - 111 mmol/L 104  104  103   CO2 22 - 32 mmol/L 24  25  26     Calcium 8.9 - 10.3 mg/dL 9.0  9.4  9.6       Assessment and Plan: Pulmonary embolism Likely provoked-mostly sedentary per grandson [place].  Although-patient appears to be on vaginal estradiol  ring-which probably should be discontinued on discharge. Very difficult situation-per family-patient is unsteady at baseline-and has had frequent falls.  Ideally not the very best candidate for long-term anticoagulation.  Long discussion with Fonda over the phone-he understands this difficult situation-we discussed-transitioning to full comfort measures and not starting anticoagulation versus temporarily placing patient on IV heparin  in the hospital while he is in the process of making decisions/meeting with the palliative care  team.  After extensive discussion-Alison Brewer-chose the latter-he understands that even in the hospital-patient could have a catastrophic fall that could lead to life threatening and life disabling consequences.  He is accepting this risk for now. Thankfully-at this point patient is hemodynamically stable-with minimal oxygen requirement-and does not seem to require higher level of care or thrombolytic/thrombectomy.Based on above discussion-will place patient on IV heparin -await further goals of care discussion.  Have ordered bedside sitter given significant risk of falls. Check echo and Doppler of lower legs.    Goals of care/advanced directive Initially full code-but after extensive discussion by this MD-with patient's Alison Brewer is now a DNR.  Palliative care consultation placed-for further goals of care discussion-disposition issues.  Very poor overall prognosis.  Not an ideal candidate for long-term anticoagulation.  Scalp laceration with hematoma On exam-this appears to have dried up-with no active bleeding.  Discussed with ED RN-this was not sutured repaired. Will watch cautiously-as patient will be on IV heparin .  Dementia with delirium Confused at baseline-only able to  tell me her name.  Does not know who she lives with-she thinks her husband is still alive. Since she will be placed on IV heparin -and is already attempting to get out of bed-will see if we can arrange for one-to-one sitter. Starting low-dose Seroquel  As needed Haldol  for severe agitation.  HTN Blood pressure on the higher side Resume low-dose amlodipine .  Chronic HFrEF Euvolemic Given frailty/poor overall prognosis-not a candidate for GDMT medications. If she develops volume overload/leg edema-will dose as needed diuretics.  Normocytic anemia Secondary to chronic disease Monitor CBC periodically.   Advance Care Planning:   Code Status: Limited: Do not attempt resuscitation (DNR) -DNR-LIMITED -Do Not Intubate/DNI    Consults: PCCM, Palliative care  Family Communication: Grandson Alison Brewer-[phone]   Severity of Illness: The appropriate patient status for this patient is INPATIENT. Inpatient status is judged to be reasonable and necessary in order to provide the required intensity of service to ensure the patient's safety. The patient's presenting symptoms, physical exam findings, and initial radiographic and laboratory data in the context of their chronic comorbidities is felt to place them at high risk for further clinical deterioration. Furthermore, it is not anticipated that the patient will be medically stable for discharge from the hospital within 2 midnights of admission.   * I certify that at the point of admission it is my clinical judgment that the patient will require inpatient hospital care spanning beyond 2 midnights from the point of admission due to high intensity of service, high risk for further deterioration and high frequency of surveillance required.*  Author: Donalda Applebaum, MD 07/03/2024 8:00 AM  For on call review [email or web address].

## 2024-07-03 NOTE — Progress Notes (Signed)
 Transition of Care [facility]) - CAGE-AID Screening   Patient Details  Name: Alison Brewer MRN: [id number] Date of Birth: 04-27-1935   MARINDA LIONEL Sora, RN Phone Number: 07/03/2024, 5:39 AM   Clinical Narrative:  Hx of dementia  CAGE-AID Screening: Substance Abuse Screening unable to be completed due to: : Patient unable to participate             Substance Abuse Education Offered: No

## 2024-07-03 NOTE — Progress Notes (Signed)
 OT Cancellation Note  Patient Details Name: Alison Brewer MRN: [id number] DOB: 21-Jul-1935   Cancelled Treatment:    Reason Eval/Treat Not Completed: Patient not medically ready (Pt admitted and found to have acute PE not on home anticoags. Pt recently started Heparin  today but is not within time frame for mobility, will hold OT at this time until medically stable)  07/03/2024  AB, OTR/L  Acute Rehabilitation Services  Office: [phone]   Curtistine JONETTA Das 07/03/2024, 1:29 PM

## 2024-07-03 NOTE — Progress Notes (Signed)
 [facility] ED [address] [facility] Liaison note   Alison Brewer is a current hospice patient with a terminal diagnosis of neurocognitive disorder with Lewey bodies. Patient's grandson notified [facility] that he returned home last night to find the patient had fallen and was bleeding from her head. Grandson activated EMS for evaluation in the ED. Found to have pulmonary embolism with acute cor pulmonale. Patient was admitted to [facility] on 7.17.25. Per Dr. Norleen Laurence with [facility], this is a related hospital admission. Patient is a DNR.   Visited patient at bedside with no family present. She was undergoing an echocardiogram at time of visit. Spoke with patient's grandson by phone. He expressed wanting to give the patient a chance for a little more time versus electing comfort care at this time. He stated that this has been a difficult situation with making tough decisions about his grandmother's care. He is aware of the risks associated with anticoagulation therapy.   Pt is inpatient appropriate due to treatment of pulmonary embolism and IV anticoagulation therapy.    VS: 97.9/71/16   161/71   100% on O2 3lpm   I/O: none recorded   Abnormal Labs:  07/02/24 20:11 Comprehensive metabolic panel with GFR:  Glucose: 113 (H) Albumin: 3.1 (L) Total Protein: 5.6 (L) GFR, Estimated: >60 Troponin I (High Sensitivity): 573 (HH) RBC: 3.56 (L) Hemoglobin: 10.7 (L) HCT: 33.0 (L)   Diagnostics:  CT ANGIOGRAPHY CHEST WITH CONTRAST   TECHNIQUE: Multidetector CT imaging of the chest was performed using the standard protocol during bolus administration of intravenous contrast. Multiplanar CT image reconstructions and MIPs were obtained to evaluate the vascular anatomy.   RADIATION DOSE REDUCTION: This exam was performed according to the departmental dose-optimization program which includes automated exposure control, adjustment of the mA and/or kV according to patient size and/or  use of iterative reconstruction technique.   CONTRAST:  75mL OMNIPAQUE  IOHEXOL  350 MG/ML SOLN   COMPARISON:  None Available.   FINDINGS: Cardiovascular: There segmental and subsegmental pulmonary emboli within the right lower lobe. There segmental pulmonary emboli in the left upper lobe and left lower lobe. There is no central pulmonary embolism. The heart is enlarged. There is no pericardial effusion. Aorta is normal in size. There are atherosclerotic calcifications of the aorta and coronary arteries.   Mediastinum/Nodes: Enlarged right hilar lymph nodes measure up to 11 mm. No other enlarged lymph nodes are seen. There subcentimeter hypodense bilateral thyroid  nodules. Esophagus is unremarkable.   Lungs/Pleura: There is a small right pleural effusion. Biapical pleuroparenchymal scarring and calcifications are present. Multifocal ground-glass opacities are seen in the bilateral upper lobes. There is right lower lobe atelectasis. There is a calcified granuloma in the left lower lobe. No pneumothorax.   Upper Abdomen: Cysts in the liver measure up to 1 cm.   Musculoskeletal: Degenerative changes affect the spine.   Review of the MIP images confirms the above findings.   IMPRESSION: 1. Bilateral segmental and subsegmental pulmonary emboli. Positive for acute PE with CTevidence of right heart strain (RV/LV Ratio = 1.0) consistent with at least submassive (intermediate risk) PE. The presence of right heart strain has been associated with an increased risk of morbidity and mortality. 2. Small right pleural effusion with right lower lobe atelectasis. 3. Multifocal ground-glass opacities in the bilateral upper lobes worrisome for infection or inflammation. 4. Cardiomegaly.   Aortic Atherosclerosis (ICD10-I70.0).  CT HEAD WITHOUT CONTRAST   CT CERVICAL SPINE WITHOUT CONTRAST   TECHNIQUE: Multidetector CT imaging of the  head and cervical spine was performed following the  standard protocol without intravenous contrast. Multiplanar CT image reconstructions of the cervical spine were also generated.   RADIATION DOSE REDUCTION: This exam was performed according to the departmental dose-optimization program which includes automated exposure control, adjustment of the mA and/or kV according to patient size and/or use of iterative reconstruction technique.   COMPARISON:  Head CT 02/24/2024, head CT and cervical spine 01/09/2023   FINDINGS: CT HEAD FINDINGS   Brain: No acute territorial infarction, hemorrhage or intracranial mass. Minimal atrophy. Moderate diffuse ventricular enlargement but stable. Chronic small vessel ischemic changes of the white matter.   Vascular: No hyperdense vessels.  Carotid vascular calcification   Skull: No fracture   Sinuses/Orbits: Completely opacified right sphenoid sinus. Mild mucosal thickening in the ethmoid sinuses.   Other: Moderate to large right parietal scalp hematoma   CT CERVICAL SPINE FINDINGS   Alignment: Reversal of cervical lordosis. Trace anterolisthesis C3 on C4 and C4 on C5. Facet alignment is within normal limits.   Skull base and vertebrae: No acute fracture. No primary bone lesion or focal pathologic process.   Soft tissues and spinal canal: No prevertebral fluid or swelling. No visible canal hematoma.   Disc levels: Multilevel degenerative change. Advanced disc space narrowing C3-C4, C5-C6 and moderate disc space narrowing at C4-C5, C6-C7. Hypertrophic facet degenerative changes at multiple levels.   Upper chest: Apical fibrosis.   Other: None   IMPRESSION: 1. No CT evidence for acute intracranial abnormality. Moderate to large right parietal scalp hematoma. 2. Reversal of cervical lordosis with multilevel degenerative change. No acute osseous abnormality.  PORTABLE CHEST 1 VIEW   COMPARISON:  02/24/2024   FINDINGS: Cardiac shadow is mildly enlarged but stable. Lungs are well  aerated bilaterally. Mild central vascular congestion is noted without edema. No focal infiltrate or effusion is noted. No bony abnormality is seen.   IMPRESSION: Mild central vascular congestion.   IV/PRN meds: Heparin  10 mL/hr IV continuous   Assessment and Plan Alison [name], MD 7.17.25: Assessment and Plan: Pulmonary embolism Likely provoked-mostly sedentary per grandson Alison Brewer.  Although-patient appears to be on vaginal estradiol  ring-which probably should be discontinued on discharge. Very difficult situation-per family-patient is unsteady at baseline-and has had frequent falls.  Ideally not the very best candidate for long-term anticoagulation.  Long discussion with Alison Brewer over the phone-he understands this difficult situation-we discussed-transitioning to full comfort measures and not starting anticoagulation versus temporarily placing patient on IV heparin  in the hospital while he is in the process of making decisions/meeting with the palliative care team.  After extensive discussion-Alison Brewer-chose the latter-he understands that even in the hospital-patient could have a catastrophic fall that could lead to life threatening and life disabling consequences.  He is accepting this risk for now. Thankfully-at this point patient is hemodynamically stable-with minimal oxygen requirement-and does not seem to require higher level of care or thrombolytic/thrombectomy.Based on above discussion-will place patient on IV heparin -await further goals of care discussion.  Have ordered bedside sitter given significant risk of falls. Check echo and Doppler of lower legs.     Goals of care/advanced directive Initially full code-but after extensive discussion by this MD-with patient's Alison Brewer is now a DNR.  Palliative care consultation placed-for further goals of care discussion-disposition issues.  Very poor overall prognosis.  Not an ideal candidate for long-term anticoagulation.   Scalp  laceration with hematoma On exam-this appears to have dried up-with no active bleeding.  Discussed with ED RN-this was not  sutured repaired. Will watch cautiously-as patient will be on IV heparin .   Dementia with delirium Confused at baseline-only able to tell me her name.  Does not know who she lives with-she thinks her husband is still alive. Since she will be placed on IV heparin -and is already attempting to get out of bed-will see if we can arrange for one-to-one sitter. Starting low-dose Seroquel  As needed Haldol  for severe agitation.   HTN Blood pressure on the higher side Resume low-dose amlodipine .   Chronic HFrEF Euvolemic Given frailty/poor overall prognosis-not a candidate for GDMT medications. If she develops volume overload/leg edema-will dose as needed diuretics.   Normocytic anemia Secondary to chronic disease Monitor CBC periodically.  Discharge Planning: ongoing, back home with hospice once medically stable   Family Contact: spoke with patient's grandson by phone   IDT: updated   Goals of Care: DNR   Medication List and Transfer Summary uploaded to patient chart.   Should patient need ambulance transport at discharge, please call [facility] as we contract with them for our active hospice patients.   Please call with any hospice questions or concerns.   Thank you, Eleanor Nail, [facility] Liaison [phone]

## 2024-07-03 NOTE — Evaluation (Signed)
 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 female with medical history significant of dementia, HFrEF, HTN, HLD-who presented to the ED following a fall/syncopal episode. She appears to have advanced dementia-at baseline-she is very confused and has episodes of agitation even at home.  She was brought to the ED-where she underwent neuroimaging which was negative for any significant intracranial issues but it did show a large parietal hematoma, she also underwent a CT angiogram of her chest which was positive for PE.    Assessment / Plan / Recommendation  Clinical Impression  Patient presents with clinical swallowing function that is overall functional and safe in appearance with patient tolerating all provided textures from SOFT diet meal tray without difficulty. No overt abnormalities appreciated across oral mechanism exam, though volitional cough was weak and vocal intensity was mildly reduced. SLP assessed tolerance of thin liquids, puree, and Dys3 solids with patient exhibited mildly prolonged oral preparation and mild oral residue intermittently, though cleared this independently with liquid wash. No overt s/sx of penetration/aspiration observed across all textures. Recommend Dys3/thin liquid diet with medications administered whole with thin  liquids. No further SLP follow up indicated.      Aspiration Risk  No limitations    Diet Recommendation Dysphagia 3 (Mech soft);Thin liquid    Liquid Administration via: Straw;Cup Medication Administration: Whole meds with liquid Supervision: Full supervision/cueing for compensatory strategies Compensations: Minimize environmental distractions Postural Changes: Seated upright at 90 degrees    Other  Recommendations Oral Care Recommendations: Oral care BID     Assistance Recommended at Discharge    Functional Status Assessment Patient has not had a recent decline in their functional status  Frequency and Duration            Prognosis        Swallow Study   General Date of Onset: 07/02/24 HPI: Alison Brewer is a 89 y.o. female with medical history significant of dementia, HFrEF, HTN, HLD-who presented to the ED following a fall/syncopal episode. She appears to have advanced dementia-at baseline-she is very confused and has episodes of agitation even at home.  She was brought to the ED-where she underwent neuroimaging which was negative for any significant intracranial issues but it did show a large parietal hematoma, she also underwent a CT angiogram of her chest which was positive for PE. Type of Study: Bedside Swallow Evaluation Previous Swallow Assessment: n/a Diet Prior to this Study: Regular;Thin liquids (Level 0) Temperature Spikes Noted: No Respiratory Status: Room air History of Recent Intubation: No Behavior/Cognition: Alert;Cooperative;Confused;Requires cueing;Distractible Oral Cavity Assessment: Within Functional Limits Oral Care Completed by SLP: No Oral Cavity - Dentition: Adequate natural dentition Vision: Functional for self-feeding Self-Feeding Abilities: Needs assist Patient Positioning: Upright in bed Baseline Vocal Quality: Normal Volitional Cough: Weak Volitional  Swallow: Able to elicit    Oral/Motor/Sensory Function Overall Oral Motor/Sensory  Function: Within functional limits   Ice Chips Ice chips: Not tested   Thin Liquid Thin Liquid: Within functional limits Presentation: Cup;Self Fed    Nectar Thick Nectar Thick Liquid: Not tested   Honey Thick Honey Thick Liquid: Not tested   Puree Puree: Within functional limits Presentation: Spoon   Solid     Solid: Impaired Presentation: Spoon Oral Phase Impairments: Impaired mastication Oral Phase Functional Implications: Oral residue;Prolonged oral transit     Alison Brewer, M.A., CCC-SLP  Alison Brewer 07/03/2024,2:44 PM

## 2024-07-03 NOTE — Progress Notes (Signed)
 [facility] [address] [facility] liaison note     This patient is a current hospice patient with [facility]. Please see media tab for detailed hospice report.    Liaison will continue to follow for any discharge planning needs and to coordinate continuation of hospice care.    Please don't hesitate to call with any Hospice related questions or concerns.    Thank you for the opportunity to participate in this patient's care.   Ardine Beckwith, LPN [facility] Liaison [phone]

## 2024-07-03 NOTE — ED Notes (Signed)
 Patient is resting comfortably.

## 2024-07-03 NOTE — ED Notes (Signed)
 A soft diet tray was ordered for the patient.

## 2024-07-04 ENCOUNTER — Other Ambulatory Visit (HOSPITAL_COMMUNITY): Payer: Self-pay

## 2024-07-04 ENCOUNTER — Telehealth (HOSPITAL_COMMUNITY): Payer: Self-pay | Admitting: Pharmacy Technician

## 2024-07-04 DIAGNOSIS — I10 Essential (primary) hypertension: Secondary | ICD-10-CM | POA: Diagnosis not present

## 2024-07-04 DIAGNOSIS — G309 Alzheimer's disease, unspecified: Secondary | ICD-10-CM | POA: Diagnosis not present

## 2024-07-04 DIAGNOSIS — I5020 Unspecified systolic (congestive) heart failure: Secondary | ICD-10-CM | POA: Diagnosis not present

## 2024-07-04 DIAGNOSIS — W19XXXA Unspecified fall, initial encounter: Secondary | ICD-10-CM | POA: Diagnosis not present

## 2024-07-04 LAB — CBC
HCT: 28.9 % — ABNORMAL LOW (ref 36.0–46.0)
Hemoglobin: 9.6 g/dL — ABNORMAL LOW (ref 12.0–15.0)
MCH: 31.1 pg (ref 26.0–34.0)
MCHC: 33.2 g/dL (ref 30.0–36.0)
MCV: 93.5 fL (ref 80.0–100.0)
Platelets: 173 K/uL (ref 150–400)
RBC: 3.09 MIL/uL — ABNORMAL LOW (ref 3.87–5.11)
RDW: 13.7 % (ref 11.5–15.5)
WBC: 7 K/uL (ref 4.0–10.5)
nRBC: 0 % (ref 0.0–0.2)

## 2024-07-04 LAB — GLUCOSE, CAPILLARY
Glucose-Capillary: 97 mg/dL (ref 70–99)
Glucose-Capillary: 93 mg/dL (ref 70–99)

## 2024-07-04 LAB — HEPARIN LEVEL (UNFRACTIONATED): Heparin Unfractionated: 0.56 [IU]/mL (ref 0.30–0.70)

## 2024-07-04 MED ORDER — APIXABAN 5 MG PO TABS
5.0000 mg | ORAL_TABLET | Freq: Two times a day (BID) | ORAL | Status: DC
  Administered 2024-07-04 – 2024-07-05 (×3): 5 mg via ORAL
  Filled 2024-07-04 (×3): qty 1

## 2024-07-04 MED ORDER — QUETIAPINE FUMARATE 25 MG PO TABS
25.0000 mg | ORAL_TABLET | Freq: Every day | ORAL | Status: DC
  Administered 2024-07-04: 25 mg via ORAL
  Filled 2024-07-04: qty 1

## 2024-07-04 MED ORDER — AMLODIPINE BESYLATE 5 MG PO TABS
5.0000 mg | ORAL_TABLET | Freq: Every day | ORAL | Status: DC
  Administered 2024-07-04 – 2024-07-05 (×2): 5 mg via ORAL
  Filled 2024-07-04 (×2): qty 1

## 2024-07-04 NOTE — Telephone Encounter (Signed)
 Pharmacy Patient Advocate Encounter  Insurance verification completed.    The patient is insured through [employer]/ [employer]. Patient has Medicare and is not eligible for a copay card, but may be able to apply for patient assistance or Medicare RX Payment Plan (Patient Must reach out to their plan, if eligible for payment plan), if available.    Ran test claim for Eliquis 5mg  tablets and the current 30 day co-pay is $47.00.   This test claim was processed through [facility]- copay amounts may vary at other pharmacies due to pharmacy/plan contracts, or as the patient moves through the different stages of their insurance plan.

## 2024-07-04 NOTE — Evaluation (Addendum)
 Occupational Therapy Evaluation Patient Details Name: Alison Brewer MRN: [id number] DOB: [birth year] Today's Date: 07/04/2024   History of Present Illness   Pt is 89 yo presenting to [facility] on 7/16 due to fall/syncopal episode; found by family on the floor with head bleeding.  Pt positive for PE. No significant intracranial issues. PMH significant of Alzheimer's dementia, hypertension, hyperlipidemia, history of squamous cell carcinoma of the skin in remission, osteoporosis     Clinical Impressions Alison Brewer was evaluated s/p the above admission list. Pt unable to state PLOF, per chart review she was managing ADLs and functional mobility with RW and cognitive assist. Upon evaluation the pt was limited by cognition, weakness, fear of falling. Overall she needed total A +2 for bed mobility and progressed to close CGA for sitting balance. Unable to stand despite use of RW and rollator, pt actively resisting with posterior bias. Due to the deficits listed below the pt also needs total A for all aspects of ADLs. Pt will benefit from continued acute OT services and skilled inpatient follow up therapy, <3 hours/day.  Of note, pt was found to have multiple wounds on her sacrum and perineal area. RN and NT made aware. VSS on RA during session, 3L re-applied at the end of the session. BP in supine at the start of the session was 173/72, BP in sitting 137/68. Due to pressure related injuries, pt would benefit from an air mattress. RN notified.       If plan is discharge home, recommend the following:   A lot of help with walking and/or transfers;Two people to help with walking and/or transfers;A lot of help with bathing/dressing/bathroom;Two people to help with bathing/dressing/bathroom;Assistance with cooking/housework;Assistance with feeding;Direct supervision/assist for medications management;Direct supervision/assist for financial management;Assist for transportation;Help with stairs or ramp for  entrance;Supervision due to cognitive status     Functional Status Assessment   Patient has had a recent decline in their functional status and demonstrates the ability to make significant improvements in function in a reasonable and predictable amount of time.     Equipment Recommendations   None recommended by OT      Precautions/Restrictions   Precautions Precautions: Fall Recall of Precautions/Restrictions: Impaired Precaution/Restrictions Comments: pressure wounds Restrictions Weight Bearing Restrictions Per Provider Order: No     Mobility Bed Mobility Overal bed mobility: Needs Assistance Bed Mobility: Rolling, Sidelying to Sit, Sit to Supine Rolling: Total assist, +2 for physical assistance, +2 for safety/equipment Sidelying to sit: Total assist, +2 for physical assistance, +2 for safety/equipment   Sit to supine: Total assist, +2 for physical assistance, +2 for safety/equipment   General bed mobility comments: Pt with minimal initiation, apparent fear of falling, resisting at times    Transfers Overall transfer level: Needs assistance Equipment used: Rolling walker (2 wheels), Rollator (4 wheels) Transfers: Sit to/from Stand Sit to Stand: Total assist, +2 physical assistance, +2 safety/equipment           General transfer comment: did not obtain full upright standing, pt resisting      Balance Overall balance assessment: Needs assistance Sitting-balance support: Feet supported Sitting balance-Leahy Scale: Fair Sitting balance - Comments: close CGA             ADL either performed or assessed with clinical judgement   ADL Overall ADL's : Needs assistance/impaired             General ADL Comments: total A for all aspects of care due to impaired cognition, unable to faciliate pt to  participate in any aspect of care     Vision Baseline Vision/History: 0 No visual deficits Vision Assessment?: No apparent visual deficits     Perception  Perception: Not tested       Praxis Praxis: Not tested       Pertinent Vitals/Pain Pain Assessment Pain Assessment: Faces Faces Pain Scale: No hurt Pain Intervention(s): Limited activity within patient's tolerance, Monitored during session     Extremity/Trunk Assessment Upper Extremity Assessment Upper Extremity Assessment: Generalized weakness;Difficult to assess due to impaired cognition (unable to assess, pt moved BUE minimally)   Lower Extremity Assessment Lower Extremity Assessment: Defer to PT evaluation   Cervical / Trunk Assessment Cervical / Trunk Assessment: Kyphotic   Communication Communication Communication: Impaired Factors Affecting Communication: Difficulty expressing self   Cognition Arousal: Alert Behavior During Therapy: WFL for tasks assessed/performed Cognition: History of cognitive impairments             OT - Cognition Comments: very limited verbalizations, no command following. apparent fear of falling                 Following commands: Impaired Following commands impaired: Follows one step commands inconsistently     Cueing  General Comments   Cueing Techniques: Verbal cues;Tactile cues;Gestural cues  VSS on RA, BP at the start of the session 173/72, BP in sitting 137/68. Multiple wounds on bottom found, RN/NT notified. Sacral foam applied, NT in room to assess other skin breakdown areas    Home Living Family/patient expects to be discharged to:: Private residence Living Arrangements: Other relatives Available Help at Discharge: Family;Available PRN/intermittently Type of Home: House Home Access: Stairs to enter [employer] of Steps: 5 Entrance Stairs-Rails: Right;Left;Can reach both Home Layout: One level     Bathroom Shower/Tub: Producer, television/film/video: Standard Bathroom Accessibility: No   Home Equipment: Rollator (4 wheels);Shower seat;Grab bars - tub/shower   Additional Comments: per prevoius  chart: asked for ramp and bathroom modifications from financial guardian 1.5 yrs ago with no results yet      Prior Functioning/Environment Prior Level of Function : Needs assist             Mobility Comments: per previous chart: rollator at all times; sleeps on the couch since her husband died--having difficulty getting up from the couch ADLs Comments: Per prevoius chart: pt will not let grandson help her with shower, dressing, toileting but will allow females to assist. has hired an Engineer, production 4 days per week    OT Problem List: Decreased activity tolerance;Decreased strength;Decreased range of motion;Impaired balance (sitting and/or standing);Decreased coordination;Decreased cognition;Decreased safety awareness;Decreased knowledge of use of DME or AE;Decreased knowledge of precautions;Pain   OT Treatment/Interventions: Self-care/ADL training;Therapeutic exercise;DME and/or AE instruction;Therapeutic activities;Cognitive remediation/compensation;Patient/family education;Balance training      OT Goals(Current goals can be found in the care plan section)   Acute Rehab OT Goals Patient Stated Goal: unable to state OT Goal Formulation: With patient Time For Goal Achievement: 07/18/24 Potential to Achieve Goals: Fair ADL Goals Pt Will Perform Upper Body Dressing: with min assist Pt Will Perform Lower Body Dressing: with mod assist Pt Will Transfer to Toilet: with mod assist;ambulating Additional ADL Goal #1: Pt will complete bed mobility with mod A as a precursor to ADLs   OT Frequency:  Min 2X/week    Co-evaluation PT/OT/SLP Co-Evaluation/Treatment: Yes Reason for Co-Treatment: Complexity of the patient's impairments (multi-system involvement);For patient/therapist safety   OT goals addressed during session: ADL's and self-care  AM-PAC OT 6 Clicks Daily Activity     Outcome Measure Help from another person eating meals?: Total Help from another person taking care of  personal grooming?: Total Help from another person toileting, which includes using toliet, bedpan, or urinal?: Total Help from another person bathing (including washing, rinsing, drying)?: Total Help from another person to put on and taking off regular upper body clothing?: Total Help from another person to put on and taking off regular lower body clothing?: Total 6 Click Score: 6   End of Session Equipment Utilized During Treatment: Rolling walker (2 wheels);Rollator (4 wheels);Gait belt Nurse Communication: Mobility status (multiple peri wounds)  Activity Tolerance: Patient tolerated treatment well Patient left: in bed;with call bell/phone within reach;with bed alarm set  OT Visit Diagnosis: Unsteadiness on feet (R26.81);Other abnormalities of gait and mobility (R26.89);History of falling (Z91.81);Muscle weakness (generalized) (M62.81);Pain                Time: 8961-8894 OT Time Calculation (min): 27 min Charges:  OT General Charges $OT Visit: 1 Visit OT Evaluation $OT Eval Moderate Complexity: 1 Mod  Lucie Kendall, OTR/L Acute Rehabilitation Services Office [phone] Secure Chat Communication Preferred   Lucie JONETTA Kendall 07/04/2024, 12:46 PM

## 2024-07-04 NOTE — ED Notes (Signed)
 Pt was bleeding profusely from a previous access site that had been cobanded. Applied pressure to site for appx. before applying gause and cobanded again. The bleeding seems to stop at this time.

## 2024-07-04 NOTE — Discharge Instructions (Signed)
 Information on my medicine - ELIQUIS  (apixaban )  Why was Eliquis  prescribed for you? Eliquis  was prescribed to treat blood clots that may have been found in the veins of your legs (deep vein thrombosis) or in your lungs (pulmonary embolism) and to reduce the risk of them occurring again.  What do You need to know about Eliquis  ? The  dose is  ONE 5 mg tablet taken TWICE daily.  Eliquis  may be taken with or without food.   Try to take the dose about the same time in the morning and in the evening. If you have difficulty swallowing the tablet whole please discuss with your pharmacist how to take the medication safely.  Take Eliquis  exactly as prescribed and DO NOT stop taking Eliquis  without talking to the doctor who prescribed the medication.  Stopping may increase your risk of developing a new blood clot.  Refill your prescription before you run out.  After discharge, you should have regular check-up appointments with your healthcare provider that is prescribing your Eliquis .    What do you do if you miss a dose? If a dose of ELIQUIS  is not taken at the scheduled time, take it as soon as possible on the same day and twice-daily administration should be resumed. The dose should not be doubled to make up for a missed dose.  Important Safety Information A possible side effect of Eliquis  is bleeding. You should call your healthcare provider right away if you experience any of the following: ? Bleeding from an injury or your nose that does not stop. ? Unusual colored urine (red or dark brown) or unusual colored stools (red or black). ? Unusual bruising for unknown reasons. ? A serious fall or if you hit your head (even if there is no bleeding).  Some medicines may interact with Eliquis  and might increase your risk of bleeding or clotting while on Eliquis . To help avoid this, consult your healthcare provider or pharmacist prior to using any new prescription or non-prescription  medications, including herbals, vitamins, non-steroidal anti-inflammatory drugs (NSAIDs) and supplements.  This website has more information on Eliquis  (apixaban ): http://[email or web address] .com/eliquis dena

## 2024-07-04 NOTE — Progress Notes (Signed)
 [facility] ED [address] [facility] Liaison note   Alison Brewer is a current hospice patient with a terminal diagnosis of neurocognitive disorder with Lewey bodies. Patient's grandson notified [facility] that he returned home last night to find the patient had fallen and was bleeding from her head. Grandson activated EMS for evaluation in the ED. Found to have pulmonary embolism with acute cor pulmonale. Patient was admitted to [facility] on 7.17.25. Per Dr. Norleen Brewer with [facility], this is a related hospital admission. Patient is a DNR.   Visited patient who was resting comfortably with no family present. Hospitalist was present for GOC discussion. Spoke with patient's grandson by phone. He is expecting a hospital bed delivery today. Plan for discharge home tomorrow.    Pt is inpatient appropriate due to treatment of pulmonary embolism and IV anticoagulation therapy.    VS: 97.6/64/12   140/75   100% on O2 2lpm   I/O: none recorded   Abnormal Labs:  RBC: 3.09 (L) Hemoglobin: 9.6 (L) HCT: 28.9 (L)  Diagnostics: none new   IV/PRN meds: Heparin  10 mL/hr IV continuous   Assessment and Plan Ghimire, Alison HERO, MD 7.18.25: Pulmonary embolism Suspected provoked etiology secondary to sedentary lifestyle/use of vaginal estradiol  cream. Given advanced dementia-recurrent falls-poor long-term candidate for anticoagulation Currently on IV heparin  after extensive discussion with patient's son on admission (see H&P) Hospice/palliative care team to follow today-I will reach out to patient's family later today as well-to continue goals of care discussion.   Addendum Discussed with grandson Alison Brewer is the primary caretaker for the patient.  He has had a chance to discuss with other family members-and at this time wants to continue long-term anticoagulation.  He claims that he and other family members will be able to provide 24/7 supervision, and that they have made arrangements for  cameras/monitors as well.  He does understand that in spite of their best efforts that if patient falls-and suffers a catastrophic injury-this could lead to life-threatening/life disabling effects (including death).  He understands these risks and is fully accepting the risks and elects to continue anticoagulation.  We will switch to Eliquis and plan to discharge her home over the weekend.   Chronic HFrEF Euvolemic As needed diuretics if she develops edema Given frailty/poor overall condition/advanced age-not a candidate for GDMT medications   HTN BP on the higher side Resume amlodipine .   Normocytic anemia Secondary to chronic disease Follow CBC.   Frequent falls Scalp hematoma Mobilize with PT/OT   Dementia with delirium Continue Seroquel  Delirium precautions.   Advanced directive/palliative care DNR in place-followed by hospice in the outpatient setting Clearly not a candidate for aggressive care Difficult situation-has PE-but not a ideal candidate for long-term anticoagulation-ongoing discussions with family regarding how to proceed-will continue to engage with family over the next several days.   Discharge Planning: ongoing, back home with hospice once medically stable   Family Contact: spoke with patient's grandson by phone   IDT: updated   Goals of Care: DNR   Medication List and Transfer Summary uploaded to patient chart.   Should patient need ambulance transport at discharge, please call [facility] as we contract with them for our active hospice patients.   Please call with any hospice questions or concerns.   Thank you, Alison Brewer, [facility] Liaison [phone]

## 2024-07-04 NOTE — Progress Notes (Addendum)
 PROGRESS NOTE        PATIENT DETAILS Name: Alison Brewer Age: 89 y.o. Sex: female Date of Birth: December 26, 1934 Admit Date: 07/02/2024 Admitting Physician Donalda CHRISTELLA Applebaum, MD ERE:Zlajwxd, Harlene POUR, NP  Brief Summary: Patient is a 89 y.o.  female with history of advanced dementia, chronic HFrEF, frequent falls who presented with a fall/syncopal episode-found to have pulmonary embolism.  Significant events: 7/17>> admit to [facility].  Significant studies: 7/16>> x-ray pelvis: No acute abnormality 7/16>> CT head: No acute intracranial abnormality 7/16>> CT C-spine: No fracture 7/16>> CTA chest: B/L segmental/subsegmental pulmonary embolism. 7/17>> B/L LE Doppler.  No DVT 7/17>> TTE: EF 35-40%, RV systolic function mildly reduced.  Significant microbiology data: None  Procedures: None  Consults: Palliative care.  Subjective: Pleasantly confused-slept throughout the night per ED RN.  Objective: Vitals: Blood pressure (!) 174/89, pulse 64, temperature (!) 97.5 F (36.4 C), temperature source Axillary, resp. rate 13, height 5' 5 (1.651 m), weight 57 kg, SpO2 100%.   Exam: Gen Exam: Confused but not in any distress. HEENT:atraumatic, normocephalic Chest: B/L clear to auscultation anteriorly CVS:S1S2 regular Abdomen:soft non tender, non distended Extremities:no edema Neurology: Non focal Skin: no rash  Pertinent Labs/Radiology:    Latest Ref Rng & Units 07/04/2024    4:01 AM 07/02/2024    8:11 PM 04/05/2024    5:00 PM  CBC  WBC 4.0 - 10.5 K/uL 7.0  8.5  8.9   Hemoglobin 12.0 - 15.0 g/dL 9.6  89.2  88.8   Hematocrit 36.0 - 46.0 % 28.9  33.0  35.4   Platelets 150 - 400 K/uL 173  198  210     Lab Results  Component Value Date   NA 137 07/02/2024   K 4.7 07/02/2024   CL 104 07/02/2024   CO2 24 07/02/2024      Assessment/Plan: Pulmonary embolism Suspected provoked etiology secondary to sedentary lifestyle/use of vaginal estradiol   cream. Given advanced dementia-recurrent falls-poor long-term candidate for anticoagulation Currently on IV heparin  after extensive discussion with patient's son on admission (see H&P) Hospice/palliative care team to follow today-I will reach out to patient's family later today as well-to continue goals of care discussion.  Addendum Discussed with grandson Lavonne is the primary caretaker for the patient.  He has had a chance to discuss with other family members-and at this time wants to continue long-term anticoagulation.  He claims that he and other family members will be able to provide 24/7 supervision, and that they have made arrangements for cameras/monitors as well.  He does understand that in spite of their best efforts that if patient falls-and suffers a catastrophic injury-this could lead to life-threatening/life disabling effects (including death).  He understands these risks and is fully accepting the risks and elects to continue anticoagulation.  We will switch to Eliquis and plan to discharge her home over the weekend.  Chronic HFrEF Euvolemic As needed diuretics if she develops edema Given frailty/poor overall condition/advanced age-not a candidate for GDMT medications  HTN BP on the higher side Resume amlodipine .  Normocytic anemia Secondary to chronic disease Follow CBC.  Frequent falls Scalp hematoma Mobilize with PT/OT  Dementia with delirium Continue Seroquel  Delirium precautions.  Advanced directive/palliative care DNR in place-followed by hospice in the outpatient setting Clearly not a candidate for aggressive care Difficult situation-has PE-but not a ideal candidate for long-term anticoagulation-ongoing  discussions with family regarding how to proceed-will continue to engage with family over the next several days.  Code status:   Code Status: Limited: Do not attempt resuscitation (DNR) -DNR-LIMITED -Do Not Intubate/DNI    DVT Prophylaxis: IV  heparin    Family Communication: [place] Clubs 7/18   Disposition Plan: Status is: Inpatient Remains inpatient appropriate because: Severity of illness   Planned Discharge Destination:Hospice care   Diet: Diet Order             DIET SOFT Room service appropriate? Yes; Fluid consistency: Thin  Diet effective now                     Antimicrobial agents: Anti-infectives (From admission, onward)    None        MEDICATIONS: Scheduled Meds:  melatonin  10 mg Oral QHS   memantine   10 mg Oral BID   polyethylene glycol  17 g Oral Daily   QUEtiapine   25 mg Oral BID   sodium chloride  flush  3 mL Intravenous Q12H   Continuous Infusions:  heparin  1,200 Units/hr (07/04/24 1006)   PRN Meds:.acetaminophen  **OR** acetaminophen , albuterol , bisacodyl , haloperidol  lactate, hydrALAZINE , ondansetron  **OR** ondansetron  (ZOFRAN ) IV, sodium chloride  flush   I have personally reviewed following labs and imaging studies  LABORATORY DATA: CBC: Recent Labs  Lab 07/02/24 2011 07/04/24 0401  WBC 8.5 7.0  NEUTROABS 6.2  --   HGB 10.7* 9.6*  HCT 33.0* 28.9*  MCV 92.7 93.5  PLT 198 173    Basic Metabolic Panel: Recent Labs  Lab 07/02/24 2011  NA 137  K 4.7  CL 104  CO2 24  GLUCOSE 113*  BUN 22  CREATININE 0.85  CALCIUM 9.0    GFR: Estimated Creatinine Clearance: 42 mL/min (by C-G formula based on SCr of 0.85 mg/dL).  Liver Function Tests: Recent Labs  Lab 07/02/24 2011  AST 34  ALT 29  ALKPHOS 111  BILITOT 0.8  PROT 5.6*  ALBUMIN 3.1*   No results for input(s): LIPASE, AMYLASE in the last 168 hours. No results for input(s): AMMONIA in the last 168 hours.  Coagulation Profile: No results for input(s): INR, PROTIME in the last 168 hours.  Cardiac Enzymes: No results for input(s): CKTOTAL, CKMB, CKMBINDEX, TROPONINI in the last 168 hours.  BNP (last 3 results) No results for input(s): PROBNP in the last 8760  hours.  Lipid Profile: No results for input(s): CHOL, HDL, LDLCALC, TRIG, CHOLHDL, LDLDIRECT in the last 72 hours.  Thyroid  Function Tests: No results for input(s): TSH, T4TOTAL, FREET4, T3FREE, THYROIDAB in the last 72 hours.  Anemia Panel: No results for input(s): VITAMINB12, FOLATE, FERRITIN, TIBC, IRON, RETICCTPCT in the last 72 hours.  Urine analysis:    Component Value Date/Time   COLORURINE AMBER (A) 04/05/2024 1820   APPEARANCEUR HAZY (A) 04/05/2024 1820   APPEARANCEUR Clear 03/17/2024 1340   LABSPEC 1.021 04/05/2024 1820   PHURINE 5.0 04/05/2024 1820   GLUCOSEU NEGATIVE 04/05/2024 1820   HGBUR NEGATIVE 04/05/2024 1820   BILIRUBINUR NEGATIVE 04/05/2024 1820   BILIRUBINUR Negative 03/17/2024 1340   KETONESUR NEGATIVE 04/05/2024 1820   PROTEINUR NEGATIVE 04/05/2024 1820   UROBILINOGEN 0.2 11/01/2023 1525   NITRITE NEGATIVE 04/05/2024 1820   LEUKOCYTESUR NEGATIVE 04/05/2024 1820    Sepsis Labs: Lactic Acid, Venous    Component Value Date/Time   LATICACIDVEN 1.0 02/13/2022 1217    MICROBIOLOGY: No results found for this or any previous visit (from the past 240 hours).  RADIOLOGY STUDIES/RESULTS:  ECHOCARDIOGRAM COMPLETE Result Date: 07/03/2024    ECHOCARDIOGRAM REPORT   Patient Name:   Alison Brewer Date of Exam: 07/03/2024 Medical Rec #:  [id number]      Height:       65.0 in Accession #:    [id number]     Weight:       125.7 lb Date of Birth:  [birth year]      BSA:          1.624 m Patient Age:    89 years       BP:           150/78 mmHg Patient Gender: F              HR:           71 bpm. Exam Location:  Inpatient Procedure: 2D Echo, Color Doppler and Cardiac Doppler (Both Spectral and Color            Flow Doppler were utilized during procedure). Indications:    I26.02 Pulmonary embolus  History:        Patient has prior history of Echocardiogram examinations, most                 recent 02/26/2024. Risk Factors:Hypertension and  Dyslipidemia.  Sonographer:    Damien Senior RDCS Referring Phys: [id number] [facility]  Sonographer Comments: Some windows limited by thin body habitus IMPRESSIONS  1. Left ventricular ejection fraction, by estimation, is 35 to 40%. Left ventricular ejection fraction by 2D MOD biplane is 37.2 %. The left ventricle has moderately decreased function. The left ventricle demonstrates regional wall motion abnormalities (see scoring diagram/findings for description). There is mild asymmetric left ventricular hypertrophy of the infero-lateral segment. Left ventricular diastolic parameters are consistent with Grade II diastolic dysfunction (pseudonormalization).  2. Right ventricular systolic function is mildly reduced. The right ventricular size is normal. Tricuspid regurgitation signal is inadequate for assessing PA pressure.  3. Left atrial size was mildly dilated.  4. The mitral valve is degenerative. Trivial mitral valve regurgitation. No evidence of mitral stenosis. The mean mitral valve gradient is 2.2 mmHg with average heart rate of 69 bpm.  5. The aortic valve has an indeterminant number of cusps. Aortic valve regurgitation is not visualized. Aortic valve sclerosis is present, with no evidence of aortic valve stenosis.  6. The inferior vena cava is dilated in size with <50% respiratory variability, suggesting right atrial pressure of 15 mmHg. FINDINGS  Left Ventricle: Left ventricular ejection fraction, by estimation, is 35 to 40%. Left ventricular ejection fraction by 2D MOD biplane is 37.2 %. The left ventricle has moderately decreased function. The left ventricle demonstrates regional wall motion abnormalities. The left ventricular internal cavity size was normal in size. There is mild asymmetric left ventricular hypertrophy of the infero-lateral segment. Left ventricular diastolic parameters are consistent with Grade II diastolic dysfunction (pseudonormalization).  LV Wall Scoring: Septal and inferior wall  akinesis. Right Ventricle: The right ventricular size is normal. No increase in right ventricular wall thickness. Right ventricular systolic function is mildly reduced. Tricuspid regurgitation signal is inadequate for assessing PA pressure. Left Atrium: Left atrial size was mildly dilated. Right Atrium: Right atrial size was normal in size. Pericardium: There is no evidence of pericardial effusion. Mitral Valve: The mitral valve is degenerative in appearance. There is moderate thickening of the mitral valve leaflet(s). Trivial mitral valve regurgitation. No evidence of mitral valve stenosis. The mean mitral valve gradient is 2.2 mmHg with average heart  rate of 69 bpm. Tricuspid Valve: The tricuspid valve is normal in structure. Tricuspid valve regurgitation is trivial. Aortic Valve: The aortic valve has an indeterminant number of cusps. Aortic valve regurgitation is not visualized. Aortic valve sclerosis is present, with no evidence of aortic valve stenosis. Pulmonic Valve: The pulmonic valve was normal in structure. Pulmonic valve regurgitation is not visualized. Aorta: The aortic root and ascending aorta are structurally normal, with no evidence of dilitation. Venous: The inferior vena cava is dilated in size with less than 50% respiratory variability, suggesting right atrial pressure of 15 mmHg. IAS/Shunts: No atrial level shunt detected by color flow Doppler.  LEFT VENTRICLE PLAX 2D                        Biplane EF (MOD) LVIDd:         4.90 cm         LV Biplane EF:   Left LVIDs:         3.50 cm                          ventricular LV PW:         1.20 cm                          ejection LV IVS:        0.80 cm                          fraction by LVOT diam:     2.00 cm                          2D MOD LV SV:         68                               biplane is LV SV Index:   42                               37.2 %. LVOT Area:     3.14 cm                                Diastology                                LV  e' medial:    3.81 cm/s LV Volumes (MOD)               LV E/e' medial:  21.0 LV vol d, MOD    83.6 ml       LV e' lateral:   7.07 cm/s A2C:                           LV E/e' lateral: 11.3 LV vol d, MOD    92.1 ml A4C: LV vol s, MOD    53.2 ml A2C: LV vol s, MOD    58.4 ml A4C: LV SV MOD A2C:   30.4 ml LV SV MOD A4C:   92.1 ml LV SV MOD BP:  33.5 ml RIGHT VENTRICLE RV S prime:     7.94 cm/s TAPSE (M-mode): 2.0 cm LEFT ATRIUM             Index        RIGHT ATRIUM           Index LA diam:        3.00 cm 1.85 cm/m   RA Area:     15.40 cm LA Vol (A2C):   40.7 ml 25.07 ml/m  RA Volume:   37.60 ml  23.16 ml/m LA Vol (A4C):   59.0 ml 36.34 ml/m LA Biplane Vol: 51.3 ml 31.60 ml/m  AORTIC VALVE LVOT Vmax:   85.40 cm/s LVOT Vmean:  65.800 cm/s LVOT VTI:    0.215 m  AORTA Ao Root diam: 3.30 cm Ao Asc diam:  3.40 cm MITRAL VALVE MV Area (PHT): 4.12 cm     SHUNTS MV Mean grad:  2.2 mmHg     Systemic VTI:  0.22 m MV Decel Time: 184 msec     Systemic Diam: 2.00 cm MV E velocity: 80.20 cm/s MV A velocity: 122.00 cm/s MV E/A ratio:  0.66 Morene Brownie Electronically signed by Morene Brownie Signature Date/Time: 07/03/2024/5:47:33 PM    Final    VAS US  LOWER EXTREMITY VENOUS (DVT) Result Date: 07/03/2024  Lower Venous DVT Study Patient Name:  Alison Brewer  Date of Exam:   07/03/2024 Medical Rec #: [id number]       Accession #:    [id number] Date of Birth: 02/11/1935       Patient Gender: F Patient Age:   75 years Exam Location:  [facility] Procedure:      VAS US  LOWER EXTREMITY VENOUS (DVT) Referring Phys: DONALDA APPLEBAUM --------------------------------------------------------------------------------  Indications: Swelling.  Risk Factors: None identified. Limitations: Poor ultrasound/tissue interface and patient positioning, patient immobility. Comparison Study: No prior studies. Performing Technologist: Cordella Collet RVT  Examination Guidelines: A complete evaluation includes B-mode imaging, spectral Doppler,  color Doppler, and power Doppler as needed of all accessible portions of each vessel. Bilateral testing is considered an integral part of a complete examination. Limited examinations for reoccurring indications may be performed as noted. The reflux portion of the exam is performed with the patient in reverse Trendelenburg.  +---------+---------------+---------+-----------+----------+--------------+ RIGHT    CompressibilityPhasicitySpontaneityPropertiesThrombus Aging +---------+---------------+---------+-----------+----------+--------------+ CFV      Full           Yes      Yes                                 +---------+---------------+---------+-----------+----------+--------------+ SFJ      Full                                                        +---------+---------------+---------+-----------+----------+--------------+ FV Prox  Full                                                        +---------+---------------+---------+-----------+----------+--------------+ FV Mid   Full                                                        +---------+---------------+---------+-----------+----------+--------------+  FV DistalFull                                                        +---------+---------------+---------+-----------+----------+--------------+ PFV      Full                                                        +---------+---------------+---------+-----------+----------+--------------+ POP      Full           Yes      Yes                                 +---------+---------------+---------+-----------+----------+--------------+ PTV      Full                                                        +---------+---------------+---------+-----------+----------+--------------+ PERO     Full                                                        +---------+---------------+---------+-----------+----------+--------------+    +---------+---------------+---------+-----------+----------+--------------+ LEFT     CompressibilityPhasicitySpontaneityPropertiesThrombus Aging +---------+---------------+---------+-----------+----------+--------------+ CFV      Full           Yes      Yes                                 +---------+---------------+---------+-----------+----------+--------------+ SFJ      Full                                                        +---------+---------------+---------+-----------+----------+--------------+ FV Prox  Full                                                        +---------+---------------+---------+-----------+----------+--------------+ FV Mid   Full                                                        +---------+---------------+---------+-----------+----------+--------------+ FV DistalFull                                                        +---------+---------------+---------+-----------+----------+--------------+  PFV      Full                                                        +---------+---------------+---------+-----------+----------+--------------+ POP      Full           Yes      Yes                                 +---------+---------------+---------+-----------+----------+--------------+ PTV      Full                                                        +---------+---------------+---------+-----------+----------+--------------+ PERO     Full                                                        +---------+---------------+---------+-----------+----------+--------------+     Summary: RIGHT: - There is no evidence of deep vein thrombosis in the lower extremity.  - No cystic structure found in the popliteal fossa.  LEFT: - There is no evidence of deep vein thrombosis in the lower extremity.  - No cystic structure found in the popliteal fossa.  *See table(s) above for measurements and observations. Electronically signed  by Debby Robertson on 07/03/2024 at 5:02:34 PM.    Final    CT Angio Chest PE W and/or Wo Contrast Result Date: 07/02/2024 CLINICAL DATA:  Evaluate for PE EXAM: CT ANGIOGRAPHY CHEST WITH CONTRAST TECHNIQUE: Multidetector CT imaging of the chest was performed using the standard protocol during bolus administration of intravenous contrast. Multiplanar CT image reconstructions and MIPs were obtained to evaluate the vascular anatomy. RADIATION DOSE REDUCTION: This exam was performed according to the departmental dose-optimization program which includes automated exposure control, adjustment of the mA and/or kV according to patient size and/or use of iterative reconstruction technique. CONTRAST:  75mL OMNIPAQUE  IOHEXOL  350 MG/ML SOLN COMPARISON:  None Available. FINDINGS: Cardiovascular: There segmental and subsegmental pulmonary emboli within the right lower lobe. There segmental pulmonary emboli in the left upper lobe and left lower lobe. There is no central pulmonary embolism. The heart is enlarged. There is no pericardial effusion. Aorta is normal in size. There are atherosclerotic calcifications of the aorta and coronary arteries. Mediastinum/Nodes: Enlarged right hilar lymph nodes measure up to 11 mm. No other enlarged lymph nodes are seen. There subcentimeter hypodense bilateral thyroid  nodules. Esophagus is unremarkable. Lungs/Pleura: There is a small right pleural effusion. Biapical pleuroparenchymal scarring and calcifications are present. Multifocal ground-glass opacities are seen in the bilateral upper lobes. There is right lower lobe atelectasis. There is a calcified granuloma in the left lower lobe. No pneumothorax. Upper Abdomen: Cysts in the liver measure up to 1 cm. Musculoskeletal: Degenerative changes affect the spine. Review of the MIP images confirms the above findings. IMPRESSION: 1. Bilateral segmental and subsegmental pulmonary emboli. Positive for acute PE with CTevidence of right heart strain  (RV/LV Ratio =  1.0) consistent with at least submassive (intermediate risk) PE. The presence of right heart strain has been associated with an increased risk of morbidity and mortality. 2. Small right pleural effusion with right lower lobe atelectasis. 3. Multifocal ground-glass opacities in the bilateral upper lobes worrisome for infection or inflammation. 4. Cardiomegaly. Aortic Atherosclerosis (ICD10-I70.0). Electronically Signed   By: Greig Pique M.D.   On: 07/02/2024 22:11   CT Head Wo Contrast Result Date: 07/02/2024 CLINICAL DATA:  Head trauma EXAM: CT HEAD WITHOUT CONTRAST CT CERVICAL SPINE WITHOUT CONTRAST TECHNIQUE: Multidetector CT imaging of the head and cervical spine was performed following the standard protocol without intravenous contrast. Multiplanar CT image reconstructions of the cervical spine were also generated. RADIATION DOSE REDUCTION: This exam was performed according to the departmental dose-optimization program which includes automated exposure control, adjustment of the mA and/or kV according to patient size and/or use of iterative reconstruction technique. COMPARISON:  Head CT 02/24/2024, head CT and cervical spine 01/09/2023 FINDINGS: CT HEAD FINDINGS Brain: No acute territorial infarction, hemorrhage or intracranial mass. Minimal atrophy. Moderate diffuse ventricular enlargement but stable. Chronic small vessel ischemic changes of the white matter. Vascular: No hyperdense vessels.  Carotid vascular calcification Skull: No fracture Sinuses/Orbits: Completely opacified right sphenoid sinus. Mild mucosal thickening in the ethmoid sinuses. Other: Moderate to large right parietal scalp hematoma CT CERVICAL SPINE FINDINGS Alignment: Reversal of cervical lordosis. Trace anterolisthesis C3 on C4 and C4 on C5. Facet alignment is within normal limits. Skull base and vertebrae: No acute fracture. No primary bone lesion or focal pathologic process. Soft tissues and spinal canal: No  prevertebral fluid or swelling. No visible canal hematoma. Disc levels: Multilevel degenerative change. Advanced disc space narrowing C3-C4, C5-C6 and moderate disc space narrowing at C4-C5, C6-C7. Hypertrophic facet degenerative changes at multiple levels. Upper chest: Apical fibrosis. Other: None IMPRESSION: 1. No CT evidence for acute intracranial abnormality. Moderate to large right parietal scalp hematoma. 2. Reversal of cervical lordosis with multilevel degenerative change. No acute osseous abnormality. Electronically Signed   By: Luke Bun M.D.   On: 07/02/2024 20:42   CT Cervical Spine Wo Contrast Result Date: 07/02/2024 CLINICAL DATA:  Head trauma EXAM: CT HEAD WITHOUT CONTRAST CT CERVICAL SPINE WITHOUT CONTRAST TECHNIQUE: Multidetector CT imaging of the head and cervical spine was performed following the standard protocol without intravenous contrast. Multiplanar CT image reconstructions of the cervical spine were also generated. RADIATION DOSE REDUCTION: This exam was performed according to the departmental dose-optimization program which includes automated exposure control, adjustment of the mA and/or kV according to patient size and/or use of iterative reconstruction technique. COMPARISON:  Head CT 02/24/2024, head CT and cervical spine 01/09/2023 FINDINGS: CT HEAD FINDINGS Brain: No acute territorial infarction, hemorrhage or intracranial mass. Minimal atrophy. Moderate diffuse ventricular enlargement but stable. Chronic small vessel ischemic changes of the white matter. Vascular: No hyperdense vessels.  Carotid vascular calcification Skull: No fracture Sinuses/Orbits: Completely opacified right sphenoid sinus. Mild mucosal thickening in the ethmoid sinuses. Other: Moderate to large right parietal scalp hematoma CT CERVICAL SPINE FINDINGS Alignment: Reversal of cervical lordosis. Trace anterolisthesis C3 on C4 and C4 on C5. Facet alignment is within normal limits. Skull base and vertebrae: No  acute fracture. No primary bone lesion or focal pathologic process. Soft tissues and spinal canal: No prevertebral fluid or swelling. No visible canal hematoma. Disc levels: Multilevel degenerative change. Advanced disc space narrowing C3-C4, C5-C6 and moderate disc space narrowing at C4-C5, C6-C7. Hypertrophic facet degenerative changes at multiple  levels. Upper chest: Apical fibrosis. Other: None IMPRESSION: 1. No CT evidence for acute intracranial abnormality. Moderate to large right parietal scalp hematoma. 2. Reversal of cervical lordosis with multilevel degenerative change. No acute osseous abnormality. Electronically Signed   By: Luke Bun M.D.   On: 07/02/2024 20:42   DG Pelvis Portable Result Date: 07/02/2024 CLINICAL DATA:  Recent fall with pelvic pain, initial encounter EXAM: PORTABLE PELVIS 1 VIEWS COMPARISON:  None Available. FINDINGS: Pelvic ring is intact. Mild degenerative changes of left hip joint are seen. No acute fracture or dislocation is noted. Degenerative change of the lumbar spine is noted. No soft tissue changes are seen. IMPRESSION: No acute abnormality noted. Electronically Signed   By: Oneil Devonshire M.D.   On: 07/02/2024 20:00   DG Chest Portable 1 View Result Date: 07/02/2024 CLINICAL DATA:  Recent fall with chest pain EXAM: PORTABLE CHEST 1 VIEW COMPARISON:  02/24/2024 FINDINGS: Cardiac shadow is mildly enlarged but stable. Lungs are well aerated bilaterally. Mild central vascular congestion is noted without edema. No focal infiltrate or effusion is noted. No bony abnormality is seen. IMPRESSION: Mild central vascular congestion. Electronically Signed   By: Oneil Devonshire M.D.   On: 07/02/2024 19:59     LOS: 1 day   Donalda Applebaum, MD  [facility]    To contact the attending provider between 7A-7P or the covering provider during after hours 7P-7A, please log into the web site [email or web address] and access using universal [place] password for that web site. If  you do not have the password, please call the hospital operator.  07/04/2024, 10:22 AM

## 2024-07-04 NOTE — Transitions of Care (Post Inpatient/ED Visit) (Signed)
 07/04/2024 TOC RN reviewed chart - Per chart, patient is currently active with [facility]. TOC outreach is not indicated  Patient ID: Alison Brewer, female   DOB: [birth year], 89 y.o.   MRN: [id number]  Shona Prow RN, CCM [name]  VBCI-Population Health RN Care Manager [phone]

## 2024-07-04 NOTE — Care Management (Signed)
 Transition of Care [facility]) - Inpatient Brief Assessment   Patient Details  Name: Alison Brewer MRN: [id number] Date of Birth: [birth year]  Transition of Care [facility]) CM/SW Contact:    Corean JAYSON Canary, RN Phone Number: 07/04/2024, 11:43 AM   Clinical Narrative: Patient presented with a fall after her family went out for a short time, history of advanced dementia, has hospice with [facility]>  She was found to have a PE.  <MD to discuss anticoagulation and risks with family. Amy G  from [facility] stated she is active with them.  No needs known at this time    Transition of Care Asessment: Insurance and Status: Insurance coverage has been reviewed Patient has primary care physician: Yes Home environment has been reviewed: Lives with family on home hospice with [facility] Prior level of function:: Needs assist Prior/Current Home Services: Current home services [facility]) Social Drivers of Health Review: SDOH reviewed no interventions necessary Readmission risk has been reviewed: Yes Transition of care needs: no transition of care needs at this time

## 2024-07-04 NOTE — Evaluation (Signed)
 Physical Therapy Evaluation Patient Details Name: Alison Brewer MRN: [id number] DOB: [birth year] Today's Date: 07/04/2024  History of Present Illness  Pt is 89 yo presenting [facility]16 due to fall/syncopal episode; found by family on the floor with head bleeding.  Pt positive for PE. No significant intracranial issues. PMH significant of Alzheimer's dementia, hypertension, hyperlipidemia, history of squamous cell carcinoma of the skin in remission, osteoporosis  Clinical Impression  Pt is presenting below baseline level of functioning. Currently pt is 2 person total A for bed mobility and sit to stand. Pt was unable to get to full upright standing. Pt was able to sit EOB for ~ 10 min at Close CGA with bil UE support. Due to pt current functional status, home set up and available assistance at home recommending skilled physical therapy services < 3 hours/day in order to address strength, balance and functional mobility to decrease risk for falls, injury, immobility, skin break down and re-hospitalization.          If plan is discharge home, recommend the following: Two people to help with walking and/or transfers;Help with stairs or ramp for entrance;Assist for transportation;Assistance with cooking/housework   Can travel by private vehicle   No    Equipment Recommendations Wheelchair cushion (measurements PT);Wheelchair (measurements PT);Hoyer lift;Hospital bed     Functional Status Assessment Patient has had a recent decline in their functional status and demonstrates the ability to make significant improvements in function in a reasonable and predictable amount of time.     Precautions / Restrictions Precautions Precautions: Fall Recall of Precautions/Restrictions: Impaired Precaution/Restrictions Comments: pressure wounds Restrictions Weight Bearing Restrictions Per Provider Order: No      Mobility  Bed Mobility Overal bed mobility: Needs Assistance Bed Mobility: Rolling,  Sidelying to Sit, Sit to Supine Rolling: Total assist, +2 for physical assistance, +2 for safety/equipment Sidelying to sit: Total assist, +2 for physical assistance, +2 for safety/equipment   Sit to supine: Total assist, +2 for physical assistance, +2 for safety/equipment   General bed mobility comments: Pt with minimal initiation, apparent fear of falling, resisting at times    Transfers Overall transfer level: Needs assistance Equipment used: Rolling walker (2 wheels), Rollator (4 wheels) Transfers: Sit to/from Stand Sit to Stand: Total assist, +2 physical assistance, +2 safety/equipment           General transfer comment: did not obtain full upright standing, pt resisting posteriorly. Attempted with RW and with rollator for familiarity but pt unable with either AD.    Ambulation/Gait   General Gait Details: unable at this time.     Balance Overall balance assessment: Needs assistance Sitting-balance support: Feet supported Sitting balance-Leahy Scale: Fair Sitting balance - Comments: close CGA Postural control: Posterior lean Standing balance support: Bilateral upper extremity supported Standing balance-Leahy Scale: Zero Standing balance comment: reliant on external support unable to get fully to standing.               Pertinent Vitals/Pain Pain Assessment Pain Assessment: Faces Faces Pain Scale: No hurt Pain Intervention(s): Limited activity within patient's tolerance, Monitored during session    Home Living Family/patient expects to be discharged to:: Private residence Living Arrangements: Other relatives Available Help at Discharge: Family;Available PRN/intermittently Type of Home: House Home Access: Stairs to enter Entrance Stairs-Rails: Right;Left;Can reach both Entrance Stairs-Number of Steps: 5   Home Layout: One level Home Equipment: Rollator (4 wheels);Shower seat;Grab bars - tub/shower Additional Comments: per prevoius chart: asked for ramp  and bathroom modifications  from financial guardian 1.5 yrs ago with no results yet    Prior Function Prior Level of Function : Needs assist             Mobility Comments: per previous chart: rollator at all times; sleeps on the couch since her husband died--having difficulty getting up from the couch ADLs Comments: Per prevoius chart: pt will not let grandson help her with shower, dressing, toileting but will allow females to assist. has hired an Engineer, production 4 days per week     Extremity/Trunk Assessment   Upper Extremity Assessment Upper Extremity Assessment: Defer to OT evaluation    Lower Extremity Assessment Lower Extremity Assessment: Generalized weakness    Cervical / Trunk Assessment Cervical / Trunk Assessment: Kyphotic  Communication   Communication Communication: Impaired Factors Affecting Communication: Difficulty expressing self    Cognition Arousal: Alert Behavior During Therapy: WFL for tasks assessed/performed   PT - Cognitive impairments: History of cognitive impairments, Orientation, Awareness, Memory, Attention, Initiation, Sequencing, Problem solving, Safety/Judgement       Following commands: Impaired Following commands impaired: Follows one step commands inconsistently     Cueing Cueing Techniques: Verbal cues, Tactile cues, Gestural cues     General Comments General comments (skin integrity, edema, etc.): Multiple wounds on bottom found and RN/NT notified. Sacral foam applied. NT in room to assess other skin issues.        Assessment/Plan    PT Assessment Patient needs continued PT services  PT Problem List Decreased strength;Decreased activity tolerance;Decreased balance;Decreased mobility;Decreased safety awareness       PT Treatment Interventions DME instruction;Balance training;Gait training;Stair training;Functional mobility training;Therapeutic activities;Therapeutic exercise;Patient/family education    PT Goals (Current goals can be  found in the Care Plan section)  Acute Rehab PT Goals PT Goal Formulation: Patient unable to participate in goal setting Time For Goal Achievement: 07/18/24 Potential to Achieve Goals: Fair    Frequency Min 2X/week     Co-evaluation   Reason for Co-Treatment: Complexity of the patient's impairments (multi-system involvement);For patient/therapist safety PT goals addressed during session: Mobility/safety with mobility OT goals addressed during session: ADL's and self-care       AM-PAC PT 6 Clicks Mobility  Outcome Measure Help needed turning from your back to your side while in a flat bed without using bedrails?: Total Help needed moving from lying on your back to sitting on the side of a flat bed without using bedrails?: Total Help needed moving to and from a bed to a chair (including a wheelchair)?: Total Help needed standing up from a chair using your arms (e.g., wheelchair or bedside chair)?: Total Help needed to walk in hospital room?: Total Help needed climbing 3-5 steps with a railing? : Total 6 Click Score: 6    End of Session Equipment Utilized During Treatment: Gait belt Activity Tolerance: Patient tolerated treatment well;Other (comment) (treatment limited due to cognition) Patient left: in bed;with call bell/phone within reach;with bed alarm set;Other (comment) (fall pads) Nurse Communication: Mobility status;Need for lift equipment PT Visit Diagnosis: Unsteadiness on feet (R26.81);Other abnormalities of gait and mobility (R26.89);Muscle weakness (generalized) (M62.81);History of falling (Z91.81)    Time: 8960-8894 PT Time Calculation (min) (ACUTE ONLY): 26 min   Charges:   PT Evaluation $PT Eval Low Complexity: 1 Low   PT General Charges $$ ACUTE PT VISIT: 1 Visit         Dorothyann Maier, DPT, CLT  Acute Rehabilitation Services Office: [phone] (Secure chat preferred)   Dorothyann VEAR Maier 07/04/2024, 1:11  PM

## 2024-07-04 NOTE — Progress Notes (Addendum)
 PHARMACY - ANTICOAGULATION CONSULT NOTE  Pharmacy Consult for IV heparin  to Eliquis Indication: pulmonary embolus  No Known Allergies  Patient Measurements: Height: 5' 5 (165.1 cm) Weight: 57 kg (125 lb 10.6 oz) IBW/kg (Calculated) : 57 HEPARIN  DW (KG): 57  Vital Signs: Temp: 97.6 F (36.4 C) (07/18 0332) Temp Source: Axillary (07/18 0332) BP: 159/67 (07/18 0200) Pulse Rate: 61 (07/18 0200)  Labs: Recent Labs    07/02/24 2011 07/02/24 2133 07/03/24 1614 07/04/24 0401  HGB 10.7*  --   --  9.6*  HCT 33.0*  --   --  28.9*  PLT 198  --   --  173  HEPARINUNFRC  --   --  0.25* 0.56  CREATININE 0.85  --   --   --   TROPONINIHS 573* 443*  --   --     Estimated Creatinine Clearance: 42 mL/min (by C-G formula based on SCr of 0.85 mg/dL).  Assessment: Alison Brewer is a 89 y.o. year old female admitted on 07/02/2024 with concern for PE. CTA with bilateral segmental and subsegmental PE (+ RHS). No anticoagulation prior to admission per dispense history. Of note, initially presented as trauma with scalp laceration and hematoma, no ICH per CTH. Pharmacy consulted to dose heparin .   Heparin  level therapeutic (0.56) on infusion at 1150 units/hr. Noted pt with some bleeding from previous IV site but pressure applied and bleeding seems to have stopped.  Goal of Therapy:  Heparin  level 0.3-0.7 units/ml Monitor platelets by anticoagulation protocol: Yes   Plan:  Increase heparin  to 1200 units / hr to prevent level decrease  Follow up daily heparin  level, CBC  Thank you. Olam Monte, PharmD  Transitioning to Eliquis today -- no plans for load given high risk of falls.  Thank you Olam Monte, PharmD

## 2024-07-05 ENCOUNTER — Other Ambulatory Visit (HOSPITAL_COMMUNITY): Payer: Self-pay

## 2024-07-05 DIAGNOSIS — I2609 Other pulmonary embolism with acute cor pulmonale: Secondary | ICD-10-CM | POA: Diagnosis not present

## 2024-07-05 DIAGNOSIS — W19XXXA Unspecified fall, initial encounter: Secondary | ICD-10-CM | POA: Diagnosis not present

## 2024-07-05 DIAGNOSIS — G309 Alzheimer's disease, unspecified: Secondary | ICD-10-CM | POA: Diagnosis not present

## 2024-07-05 DIAGNOSIS — I5020 Unspecified systolic (congestive) heart failure: Secondary | ICD-10-CM | POA: Diagnosis not present

## 2024-07-05 LAB — GLUCOSE, CAPILLARY: Glucose-Capillary: 95 mg/dL (ref 70–99)

## 2024-07-05 MED ORDER — QUETIAPINE FUMARATE 25 MG PO TABS
25.0000 mg | ORAL_TABLET | Freq: Every day | ORAL | 1 refills | Status: AC
  Filled 2024-07-05: qty 30, 30d supply, fill #0

## 2024-07-05 MED ORDER — MELATONIN 5 MG PO TABS
10.0000 mg | ORAL_TABLET | Freq: Every day | ORAL | 1 refills | Status: AC
  Filled 2024-07-05: qty 30, 15d supply, fill #0

## 2024-07-05 MED ORDER — APIXABAN 5 MG PO TABS
5.0000 mg | ORAL_TABLET | Freq: Two times a day (BID) | ORAL | 2 refills | Status: AC
  Filled 2024-07-05: qty 60, 30d supply, fill #0

## 2024-07-05 NOTE — Plan of Care (Signed)

## 2024-07-05 NOTE — TOC Transition Note (Signed)
 Transition of Care [facility]) - Discharge Note   Patient Details  Name: Alison Brewer MRN: [id number] Date of Birth: [birth year]  [facility]) CM/SW Contact:  Marval Gell, RN Phone Number: 07/05/2024, 9:26 AM   Clinical Narrative:      Patient from home w grandson, spoke with him over the phone. He has confirmed that they have received the hospital bed and have all DME needed at home. He is ready to recive her at home. Nicole Pitmann w [facility] has been notified that patient will DC to home today.  Verified address w grandson and [facility] called for transport home. Forms to unit        Patient Goals and CMS Choice            Discharge Placement                       Discharge Plan and Services Additional resources added to the After Visit Summary for                                       Social Drivers of Health (SDOH) Interventions SDOH Screenings   Food Insecurity: Patient Unable To Answer (02/25/2024)  Housing: Patient Unable To Answer (02/25/2024)  Transportation Needs: Patient Unable To Answer (02/26/2024)  Utilities: Patient Unable To Answer (02/26/2024)  Depression (PHQ2-9): Low Risk  (06/06/2024)  Social Connections: Unknown (02/26/2024)  Tobacco Use: Medium Risk (07/02/2024)     Readmission Risk Interventions     No data to display

## 2024-07-05 NOTE — Discharge Summary (Signed)
 PATIENT DETAILS Name: Alison Brewer Age: 89 y.o. Sex: female Date of Birth: [birth year] MRN: [id number]. Admitting Physician: Donalda CHRISTELLA Applebaum, MD ERE:[email or web address], Harlene POUR, NP  Admit Date: 07/02/2024 Discharge date: 07/05/2024  Recommendations for Outpatient Follow-up:  Follow up with PCP in 1-2 weeks   Admitted From:  Home w hospice  Disposition: Home w hospice   Discharge Condition: fair  CODE STATUS:   Code Status: Limited: Do not attempt resuscitation (DNR) -DNR-LIMITED -Do Not Intubate/DNI    Diet recommendation:  Diet Order             Diet - low sodium heart healthy           DIET SOFT Room service appropriate? Yes; Fluid consistency: Thin  Diet effective now                    Brief Summary: Patient is a 89 y.o.  female with history of advanced dementia, chronic HFrEF, frequent falls who presented with a fall/syncopal episode-found to have pulmonary embolism.   Significant events: 7/17>> admit to [facility].   Significant studies: 7/16>> x-ray pelvis: No acute abnormality 7/16>> CT head: No acute intracranial abnormality 7/16>> CT C-spine: No fracture 7/16>> CTA chest: B/L segmental/subsegmental pulmonary embolism. 7/17>> B/L LE Doppler.  No DVT 7/17>> TTE: EF 35-40%, RV systolic function mildly reduced.   Significant microbiology data: None   Procedures: None   Consults: Palliative care  Brief Hospital Course: Pulmonary embolism Suspected provoked etiology secondary to sedentary lifestyle/use of vaginal estradiol  cream. Given advanced dementia-recurrent falls-poor long-term candidate for anticoagulation Extensive discussion with Donelda who is the next of kin-he understands that although she ideally requires anticoagulation for at least 6 months-given her frailty/risk of falls-he realizes that the patient is not a great candidate for long-term anticoagulation.  Risks of bleeding including catastrophic fall leading to life-threatening/life  disabling effects discussed in great detail.  Fonda had a chance to discuss with with family members, family and elects to continue anticoagulation on discharge, they have made arrangements amongst ourselves to provide 24/7 care/supervision.  They have also bought sensors/video monitors closer eye on the patient.  They are fully accepting of the risks of falls/bleeding at this point.  Although patient is established with hospice at home-family is not yet ready to transition to comfort measures, and strongly feel that they can provide supervision and want to continue anticoagulation on discharge. As noted above-ideally needs at least 6 months of treatment-will defer final duration to outpatient physicians including a hospice physician/PCP-based on clinical evolution   Chronic HFrEF Euvolemic As needed diuretics if she develops edema Given frailty/poor overall condition/advanced age-not a candidate for GDMT medications   HTN BP stable on amlodipine    Normocytic anemia Secondary to chronic disease Follow CBC.   Frequent falls Scalp hematoma Difficult situation--see above documentation Family to provide 24/7 supervision Followed by home hospice.   Dementia with delirium No major issues-has done well with Seroquel  Will continue with Seroquel  nightly on discharge.  Advanced directive/palliative care DNR in place-followed by hospice in the outpatient setting Clearly not a candidate for aggressive care See above documentation-continue to engage with hospice/palliative care in the outpatient setting.  Pressure Ulcer: Pressure Injury 07/04/24 Sacrum Mid;Posterior Stage 2 -  Partial thickness loss of dermis presenting as a shallow open injury with a red, pink wound bed without slough. (Active)  07/04/24 0915  Location: Sacrum  Location Orientation: Mid;Posterior  Staging: Stage 2 -  Partial thickness loss of dermis presenting  as a shallow open injury with a red, pink wound bed without  slough.  Wound Description (Comments):   DO NOT USE:  Present on Admission: Yes  Dressing Type Foam - Lift dressing to assess site every shift 07/04/24 2000     Principal Problem:   Pulmonary embolism (HCC) Active Problems:   Dementia due to Alzheimer's disease (HCC)   HTN (hypertension)   History of squamous cell carcinoma   HFrEF (heart failure with reduced ejection fraction) (HCC)   Palliative care by specialist   Discharge Instructions:  Activity:  As tolerated with Full fall precautions use walker/cane & assistance as needed   Discharge Instructions     Diet - low sodium heart healthy   Complete by: As directed    Discharge instructions   Complete by: As directed    Follow with Primary MD  Caro Harlene POUR, NP in 1-2 weeks  Please get a complete blood count and chemistry panel checked by your Primary MD at your next visit, and again as instructed by your Primary MD.  Get Medicines reviewed and adjusted: Please take all your medications with you for your next visit with your Primary MD  Laboratory/radiological data: Please request your Primary MD to go over all hospital tests and procedure/radiological results at the follow up, please ask your Primary MD to get all Hospital records sent to his/her office.  In some cases, they will be blood work, cultures and biopsy results pending at the time of your discharge. Please request that your primary care M.D. follows up on these results.  Also Note the following: If you experience worsening of your admission symptoms, develop shortness of breath, life threatening emergency, suicidal or homicidal thoughts you must seek medical attention immediately by calling 911 or calling your MD immediately  if symptoms less severe.  You must read complete instructions/literature along with all the possible adverse reactions/side effects for all the Medicines you take and that have been prescribed to you. Take any new Medicines after you  have completely understood and accpet all the possible adverse reactions/side effects.   Do not drive when taking Pain medications or sleeping medications (Benzodaizepines)  Do not take more than prescribed Pain, Sleep and Anxiety Medications. It is not advisable to combine anxiety,sleep and pain medications without talking with your primary care practitioner  Special Instructions: If you have smoked or chewed Tobacco  in the last 2 yrs please stop smoking, stop any regular Alcohol  and or any Recreational drug use.  Wear Seat belts while driving.  Please note: You were cared for by a hospitalist during your hospital stay. Once you are discharged, your primary care physician will handle any further medical issues. Please note that NO REFILLS for any discharge medications will be authorized once you are discharged, as it is imperative that you return to your primary care physician (or establish a relationship with a primary care physician if you do not have one) for your post hospital discharge needs so that they can reassess your need for medications and monitor your lab values.   Increase activity slowly   Complete by: As directed    No wound care   Complete by: As directed       Allergies as of 07/05/2024   No Known Allergies      Medication List     STOP taking these medications    aspirin EC 81 MG tablet   Estring  7.5 MCG/24HR vaginal ring Generic drug: estradiol   TAKE these medications    albuterol  108 (90 Base) MCG/ACT inhaler Commonly known as: VENTOLIN  HFA Inhale 1-2 puffs into the lungs every 6 (six) hours as needed for wheezing or shortness of breath.   alendronate  70 MG tablet Commonly known as: FOSAMAX  TAKE ONE TABLET BY MOUTH EVERY SEVEN DAYS WITH A FULL GLASS OF WATER ON AN EMPTY STOMACH   amLODipine  5 MG tablet Commonly known as: NORVASC  Take 5 mg by mouth daily. What changed: Another medication with the same name was removed. Continue taking this  medication, and follow the directions you see here.   apixaban  5 MG Tabs tablet Commonly known as: ELIQUIS  Take 1 tablet (5 mg total) by mouth 2 (two) times daily.   CALCIUM 600+D3 PO Take 1 tablet by mouth 2 (two) times daily.   Melatonin 10 MG Tabs Take 10 mg by mouth at bedtime.   memantine  10 MG tablet Commonly known as: NAMENDA  Take 1 tablet (10 mg total) by mouth 2 (two) times daily.   multivitamin tablet Take 1 tablet by mouth daily.   QUEtiapine  25 MG tablet Commonly known as: SEROQUEL  Take 1 tablet (25 mg total) by mouth at bedtime.   senna 8.6 MG Tabs tablet Commonly known as: SENOKOT Take 2 tablets by mouth 2 (two) times daily.        Follow-up Information     Caro Harlene POUR, NP. Schedule an appointment as soon as possible for a visit in 1 week(s).   Specialty: Geriatric Medicine Contact information: [address] [place] [address] [phone]                No Known Allergies   Other Procedures/Studies: ECHOCARDIOGRAM COMPLETE Result Date: 07/03/2024    ECHOCARDIOGRAM REPORT   Patient Name:   Alison Brewer Date of Exam: 07/03/2024 Medical Rec #:  [id number]      Height:       65.0 in Accession #:    [id number]     Weight:       125.7 lb Date of Birth:  1935-10-23      BSA:          1.624 m Patient Age:    89 years       BP:           150/78 mmHg Patient Gender: F              HR:           71 bpm. Exam Location:  Inpatient Procedure: 2D Echo, Color Doppler and Cardiac Doppler (Both Spectral and Color            Flow Doppler were utilized during procedure). Indications:    I26.02 Pulmonary embolus  History:        Patient has prior history of Echocardiogram examinations, most                 recent 02/26/2024. Risk Factors:Hypertension and Dyslipidemia.  Sonographer:    Damien Senior RDCS Referring Phys: [phone] [facility]  Sonographer Comments: Some windows limited by thin body habitus IMPRESSIONS  1. Left ventricular ejection fraction, by  estimation, is 35 to 40%. Left ventricular ejection fraction by 2D MOD biplane is 37.2 %. The left ventricle has moderately decreased function. The left ventricle demonstrates regional wall motion abnormalities (see scoring diagram/findings for description). There is mild asymmetric left ventricular hypertrophy of the infero-lateral segment. Left ventricular diastolic parameters are consistent with Grade II diastolic dysfunction (pseudonormalization).  2. Right ventricular systolic function is mildly reduced. The right ventricular size is normal. Tricuspid regurgitation signal is inadequate for assessing PA pressure.  3. Left atrial size was mildly dilated.  4. The mitral valve is degenerative. Trivial mitral valve regurgitation. No evidence of mitral stenosis. The mean mitral valve gradient is 2.2 mmHg with average heart rate of 69 bpm.  5. The aortic valve has an indeterminant number of cusps. Aortic valve regurgitation is not visualized. Aortic valve sclerosis is present, with no evidence of aortic valve stenosis.  6. The inferior vena cava is dilated in size with <50% respiratory variability, suggesting right atrial pressure of 15 mmHg. FINDINGS  Left Ventricle: Left ventricular ejection fraction, by estimation, is 35 to 40%. Left ventricular ejection fraction by 2D MOD biplane is 37.2 %. The left ventricle has moderately decreased function. The left ventricle demonstrates regional wall motion abnormalities. The left ventricular internal cavity size was normal in size. There is mild asymmetric left ventricular hypertrophy of the infero-lateral segment. Left ventricular diastolic parameters are consistent with Grade II diastolic dysfunction (pseudonormalization).  LV Wall Scoring: Septal and inferior wall akinesis. Right Ventricle: The right ventricular size is normal. No increase in right ventricular wall thickness. Right ventricular systolic function is mildly reduced. Tricuspid regurgitation signal is  inadequate for assessing PA pressure. Left Atrium: Left atrial size was mildly dilated. Right Atrium: Right atrial size was normal in size. Pericardium: There is no evidence of pericardial effusion. Mitral Valve: The mitral valve is degenerative in appearance. There is moderate thickening of the mitral valve leaflet(s). Trivial mitral valve regurgitation. No evidence of mitral valve stenosis. The mean mitral valve gradient is 2.2 mmHg with average heart rate of 69 bpm. Tricuspid Valve: The tricuspid valve is normal in structure. Tricuspid valve regurgitation is trivial. Aortic Valve: The aortic valve has an indeterminant number of cusps. Aortic valve regurgitation is not visualized. Aortic valve sclerosis is present, with no evidence of aortic valve stenosis. Pulmonic Valve: The pulmonic valve was normal in structure. Pulmonic valve regurgitation is not visualized. Aorta: The aortic root and ascending aorta are structurally normal, with no evidence of dilitation. Venous: The inferior vena cava is dilated in size with less than 50% respiratory variability, suggesting right atrial pressure of 15 mmHg. IAS/Shunts: No atrial level shunt detected by color flow Doppler.  LEFT VENTRICLE PLAX 2D                        Biplane EF (MOD) LVIDd:         4.90 cm         LV Biplane EF:   Left LVIDs:         3.50 cm                          ventricular LV PW:         1.20 cm                          ejection LV IVS:        0.80 cm                          fraction by LVOT diam:     2.00 cm  2D MOD LV SV:         68                               biplane is LV SV Index:   42                               37.2 %. LVOT Area:     3.14 cm                                Diastology                                LV e' medial:    3.81 cm/s LV Volumes (MOD)               LV E/e' medial:  21.0 LV vol d, MOD    83.6 ml       LV e' lateral:   7.07 cm/s A2C:                           LV E/e' lateral: 11.3 LV vol d, MOD     92.1 ml A4C: LV vol s, MOD    53.2 ml A2C: LV vol s, MOD    58.4 ml A4C: LV SV MOD A2C:   30.4 ml LV SV MOD A4C:   92.1 ml LV SV MOD BP:    33.5 ml RIGHT VENTRICLE RV S prime:     7.94 cm/s TAPSE (M-mode): 2.0 cm LEFT ATRIUM             Index        RIGHT ATRIUM           Index LA diam:        3.00 cm 1.85 cm/m   RA Area:     15.40 cm LA Vol (A2C):   40.7 ml 25.07 ml/m  RA Volume:   37.60 ml  23.16 ml/m LA Vol (A4C):   59.0 ml 36.34 ml/m LA Biplane Vol: 51.3 ml 31.60 ml/m  AORTIC VALVE LVOT Vmax:   85.40 cm/s LVOT Vmean:  65.800 cm/s LVOT VTI:    0.215 m  AORTA Ao Root diam: 3.30 cm Ao Asc diam:  3.40 cm MITRAL VALVE MV Area (PHT): 4.12 cm     SHUNTS MV Mean grad:  2.2 mmHg     Systemic VTI:  0.22 m MV Decel Time: 184 msec     Systemic Diam: 2.00 cm MV E velocity: 80.20 cm/s MV A velocity: 122.00 cm/s MV E/A ratio:  0.66 Morene Brownie Electronically signed by Morene Brownie Signature Date/Time: 07/03/2024/5:47:33 PM    Final    VAS US  LOWER EXTREMITY VENOUS (DVT) Result Date: 07/03/2024  Lower Venous DVT Study Patient Name:  Alison Brewer  Date of Exam:   07/03/2024 Medical Rec #: [id number]       Accession #:    [id number] Date of Birth: 10/23/1935       Patient Gender: F Patient Age:   32 years Exam Location:  [facility] Procedure:      VAS US  LOWER EXTREMITY VENOUS (DVT) Referring Phys: DONALDA APPLEBAUM --------------------------------------------------------------------------------  Indications: Swelling.  Risk Factors: None  identified. Limitations: Poor ultrasound/tissue interface and patient positioning, patient immobility. Comparison Study: No prior studies. Performing Technologist: Cordella Collet RVT  Examination Guidelines: A complete evaluation includes B-mode imaging, spectral Doppler, color Doppler, and power Doppler as needed of all accessible portions of each vessel. Bilateral testing is considered an integral part of a complete examination. Limited examinations for reoccurring  indications may be performed as noted. The reflux portion of the exam is performed with the patient in reverse Trendelenburg.  +---------+---------------+---------+-----------+----------+--------------+ RIGHT    CompressibilityPhasicitySpontaneityPropertiesThrombus Aging +---------+---------------+---------+-----------+----------+--------------+ CFV      Full           Yes      Yes                                 +---------+---------------+---------+-----------+----------+--------------+ SFJ      Full                                                        +---------+---------------+---------+-----------+----------+--------------+ FV Prox  Full                                                        +---------+---------------+---------+-----------+----------+--------------+ FV Mid   Full                                                        +---------+---------------+---------+-----------+----------+--------------+ FV DistalFull                                                        +---------+---------------+---------+-----------+----------+--------------+ PFV      Full                                                        +---------+---------------+---------+-----------+----------+--------------+ POP      Full           Yes      Yes                                 +---------+---------------+---------+-----------+----------+--------------+ PTV      Full                                                        +---------+---------------+---------+-----------+----------+--------------+ PERO     Full                                                        +---------+---------------+---------+-----------+----------+--------------+   +---------+---------------+---------+-----------+----------+--------------+  LEFT     CompressibilityPhasicitySpontaneityPropertiesThrombus Aging  +---------+---------------+---------+-----------+----------+--------------+ CFV      Full           Yes      Yes                                 +---------+---------------+---------+-----------+----------+--------------+ SFJ      Full                                                        +---------+---------------+---------+-----------+----------+--------------+ FV Prox  Full                                                        +---------+---------------+---------+-----------+----------+--------------+ FV Mid   Full                                                        +---------+---------------+---------+-----------+----------+--------------+ FV DistalFull                                                        +---------+---------------+---------+-----------+----------+--------------+ PFV      Full                                                        +---------+---------------+---------+-----------+----------+--------------+ POP      Full           Yes      Yes                                 +---------+---------------+---------+-----------+----------+--------------+ PTV      Full                                                        +---------+---------------+---------+-----------+----------+--------------+ PERO     Full                                                        +---------+---------------+---------+-----------+----------+--------------+     Summary: RIGHT: - There is no evidence of deep vein thrombosis in the lower extremity.  - No cystic structure found in the popliteal fossa.  LEFT: - There is no evidence of deep vein thrombosis in the lower extremity.  - No  cystic structure found in the popliteal fossa.  *See table(s) above for measurements and observations. Electronically signed by Debby Robertson on 07/03/2024 at 5:02:34 PM.    Final    CT Angio Chest PE W and/or Wo Contrast Result Date: 07/02/2024 CLINICAL DATA:  Evaluate for  PE EXAM: CT ANGIOGRAPHY CHEST WITH CONTRAST TECHNIQUE: Multidetector CT imaging of the chest was performed using the standard protocol during bolus administration of intravenous contrast. Multiplanar CT image reconstructions and MIPs were obtained to evaluate the vascular anatomy. RADIATION DOSE REDUCTION: This exam was performed according to the departmental dose-optimization program which includes automated exposure control, adjustment of the mA and/or kV according to patient size and/or use of iterative reconstruction technique. CONTRAST:  75mL OMNIPAQUE  IOHEXOL  350 MG/ML SOLN COMPARISON:  None Available. FINDINGS: Cardiovascular: There segmental and subsegmental pulmonary emboli within the right lower lobe. There segmental pulmonary emboli in the left upper lobe and left lower lobe. There is no central pulmonary embolism. The heart is enlarged. There is no pericardial effusion. Aorta is normal in size. There are atherosclerotic calcifications of the aorta and coronary arteries. Mediastinum/Nodes: Enlarged right hilar lymph nodes measure up to 11 mm. No other enlarged lymph nodes are seen. There subcentimeter hypodense bilateral thyroid  nodules. Esophagus is unremarkable. Lungs/Pleura: There is a small right pleural effusion. Biapical pleuroparenchymal scarring and calcifications are present. Multifocal ground-glass opacities are seen in the bilateral upper lobes. There is right lower lobe atelectasis. There is a calcified granuloma in the left lower lobe. No pneumothorax. Upper Abdomen: Cysts in the liver measure up to 1 cm. Musculoskeletal: Degenerative changes affect the spine. Review of the MIP images confirms the above findings. IMPRESSION: 1. Bilateral segmental and subsegmental pulmonary emboli. Positive for acute PE with CTevidence of right heart strain (RV/LV Ratio = 1.0) consistent with at least submassive (intermediate risk) PE. The presence of right heart strain has been associated with an increased  risk of morbidity and mortality. 2. Small right pleural effusion with right lower lobe atelectasis. 3. Multifocal ground-glass opacities in the bilateral upper lobes worrisome for infection or inflammation. 4. Cardiomegaly. Aortic Atherosclerosis (ICD10-I70.0). Electronically Signed   By: Greig Pique M.D.   On: 07/02/2024 22:11   CT Head Wo Contrast Result Date: 07/02/2024 CLINICAL DATA:  Head trauma EXAM: CT HEAD WITHOUT CONTRAST CT CERVICAL SPINE WITHOUT CONTRAST TECHNIQUE: Multidetector CT imaging of the head and cervical spine was performed following the standard protocol without intravenous contrast. Multiplanar CT image reconstructions of the cervical spine were also generated. RADIATION DOSE REDUCTION: This exam was performed according to the departmental dose-optimization program which includes automated exposure control, adjustment of the mA and/or kV according to patient size and/or use of iterative reconstruction technique. COMPARISON:  Head CT 02/24/2024, head CT and cervical spine 01/09/2023 FINDINGS: CT HEAD FINDINGS Brain: No acute territorial infarction, hemorrhage or intracranial mass. Minimal atrophy. Moderate diffuse ventricular enlargement but stable. Chronic small vessel ischemic changes of the white matter. Vascular: No hyperdense vessels.  Carotid vascular calcification Skull: No fracture Sinuses/Orbits: Completely opacified right sphenoid sinus. Mild mucosal thickening in the ethmoid sinuses. Other: Moderate to large right parietal scalp hematoma CT CERVICAL SPINE FINDINGS Alignment: Reversal of cervical lordosis. Trace anterolisthesis C3 on C4 and C4 on C5. Facet alignment is within normal limits. Skull base and vertebrae: No acute fracture. No primary bone lesion or focal pathologic process. Soft tissues and spinal canal: No prevertebral fluid or swelling. No visible canal hematoma. Disc levels: Multilevel degenerative change. Advanced disc space narrowing  C3-C4, C5-C6 and moderate  disc space narrowing at C4-C5, C6-C7. Hypertrophic facet degenerative changes at multiple levels. Upper chest: Apical fibrosis. Other: None IMPRESSION: 1. No CT evidence for acute intracranial abnormality. Moderate to large right parietal scalp hematoma. 2. Reversal of cervical lordosis with multilevel degenerative change. No acute osseous abnormality. Electronically Signed   By: Luke Bun M.D.   On: 07/02/2024 20:42   CT Cervical Spine Wo Contrast Result Date: 07/02/2024 CLINICAL DATA:  Head trauma EXAM: CT HEAD WITHOUT CONTRAST CT CERVICAL SPINE WITHOUT CONTRAST TECHNIQUE: Multidetector CT imaging of the head and cervical spine was performed following the standard protocol without intravenous contrast. Multiplanar CT image reconstructions of the cervical spine were also generated. RADIATION DOSE REDUCTION: This exam was performed according to the departmental dose-optimization program which includes automated exposure control, adjustment of the mA and/or kV according to patient size and/or use of iterative reconstruction technique. COMPARISON:  Head CT 02/24/2024, head CT and cervical spine 01/09/2023 FINDINGS: CT HEAD FINDINGS Brain: No acute territorial infarction, hemorrhage or intracranial mass. Minimal atrophy. Moderate diffuse ventricular enlargement but stable. Chronic small vessel ischemic changes of the white matter. Vascular: No hyperdense vessels.  Carotid vascular calcification Skull: No fracture Sinuses/Orbits: Completely opacified right sphenoid sinus. Mild mucosal thickening in the ethmoid sinuses. Other: Moderate to large right parietal scalp hematoma CT CERVICAL SPINE FINDINGS Alignment: Reversal of cervical lordosis. Trace anterolisthesis C3 on C4 and C4 on C5. Facet alignment is within normal limits. Skull base and vertebrae: No acute fracture. No primary bone lesion or focal pathologic process. Soft tissues and spinal canal: No prevertebral fluid or swelling. No visible canal hematoma.  Disc levels: Multilevel degenerative change. Advanced disc space narrowing C3-C4, C5-C6 and moderate disc space narrowing at C4-C5, C6-C7. Hypertrophic facet degenerative changes at multiple levels. Upper chest: Apical fibrosis. Other: None IMPRESSION: 1. No CT evidence for acute intracranial abnormality. Moderate to large right parietal scalp hematoma. 2. Reversal of cervical lordosis with multilevel degenerative change. No acute osseous abnormality. Electronically Signed   By: Luke Bun M.D.   On: 07/02/2024 20:42   DG Pelvis Portable Result Date: 07/02/2024 CLINICAL DATA:  Recent fall with pelvic pain, initial encounter EXAM: PORTABLE PELVIS 1 VIEWS COMPARISON:  None Available. FINDINGS: Pelvic ring is intact. Mild degenerative changes of left hip joint are seen. No acute fracture or dislocation is noted. Degenerative change of the lumbar spine is noted. No soft tissue changes are seen. IMPRESSION: No acute abnormality noted. Electronically Signed   By: Oneil Devonshire M.D.   On: 07/02/2024 20:00   DG Chest Portable 1 View Result Date: 07/02/2024 CLINICAL DATA:  Recent fall with chest pain EXAM: PORTABLE CHEST 1 VIEW COMPARISON:  02/24/2024 FINDINGS: Cardiac shadow is mildly enlarged but stable. Lungs are well aerated bilaterally. Mild central vascular congestion is noted without edema. No focal infiltrate or effusion is noted. No bony abnormality is seen. IMPRESSION: Mild central vascular congestion. Electronically Signed   By: Oneil Devonshire M.D.   On: 07/02/2024 19:59     TODAY-DAY OF DISCHARGE:  Subjective:   Alison Brewer today has no headache,no chest abdominal pain,no new weakness tingling or numbness, feels much better wants to go home today.   Objective:   Blood pressure (!) 145/66, pulse 64, temperature 97.7 F (36.5 C), temperature source Oral, resp. rate 10, height 5' 5 (1.651 m), weight 57 kg, SpO2 94%.  Intake/Output Summary (Last 24 hours) at 07/05/2024 0915 Last data filed at  07/04/2024 1742 Gross per  24 hour  Intake --  Output 400 ml  Net -400 ml   Filed Weights   07/03/24 0800  Weight: 57 kg    Exam: Awake Alert, Oriented *3, No new F.N deficits, Normal affect [place].AT,PERRAL Supple Neck,No JVD, No cervical lymphadenopathy appriciated.  Symmetrical Chest wall movement, Good air movement bilaterally, CTAB RRR,No Gallops,Rubs or new Murmurs, No Parasternal Heave +ve B.Sounds, Abd Soft, Non tender, No organomegaly appriciated, No rebound -guarding or rigidity. No Cyanosis, Clubbing or edema, No new Rash or bruise   PERTINENT RADIOLOGIC STUDIES: ECHOCARDIOGRAM COMPLETE Result Date: 07/03/2024    ECHOCARDIOGRAM REPORT   Patient Name:   Alison Brewer Date of Exam: 07/03/2024 Medical Rec #:  [id number]      Height:       65.0 in Accession #:    [id number]     Weight:       125.7 lb Date of Birth:  [birth year]      BSA:          1.624 m Patient Age:    89 years       BP:           150/78 mmHg Patient Gender: F              HR:           71 bpm. Exam Location:  Inpatient Procedure: 2D Echo, Color Doppler and Cardiac Doppler (Both Spectral and Color            Flow Doppler were utilized during procedure). Indications:    I26.02 Pulmonary embolus  History:        Patient has prior history of Echocardiogram examinations, most                 recent 02/26/2024. Risk Factors:Hypertension and Dyslipidemia.  Sonographer:    Damien Senior RDCS Referring Phys: [phone] [facility]  Sonographer Comments: Some windows limited by thin body habitus IMPRESSIONS  1. Left ventricular ejection fraction, by estimation, is 35 to 40%. Left ventricular ejection fraction by 2D MOD biplane is 37.2 %. The left ventricle has moderately decreased function. The left ventricle demonstrates regional wall motion abnormalities (see scoring diagram/findings for description). There is mild asymmetric left ventricular hypertrophy of the infero-lateral segment. Left ventricular diastolic parameters are  consistent with Grade II diastolic dysfunction (pseudonormalization).  2. Right ventricular systolic function is mildly reduced. The right ventricular size is normal. Tricuspid regurgitation signal is inadequate for assessing PA pressure.  3. Left atrial size was mildly dilated.  4. The mitral valve is degenerative. Trivial mitral valve regurgitation. No evidence of mitral stenosis. The mean mitral valve gradient is 2.2 mmHg with average heart rate of 69 bpm.  5. The aortic valve has an indeterminant number of cusps. Aortic valve regurgitation is not visualized. Aortic valve sclerosis is present, with no evidence of aortic valve stenosis.  6. The inferior vena cava is dilated in size with <50% respiratory variability, suggesting right atrial pressure of 15 mmHg. FINDINGS  Left Ventricle: Left ventricular ejection fraction, by estimation, is 35 to 40%. Left ventricular ejection fraction by 2D MOD biplane is 37.2 %. The left ventricle has moderately decreased function. The left ventricle demonstrates regional wall motion abnormalities. The left ventricular internal cavity size was normal in size. There is mild asymmetric left ventricular hypertrophy of the infero-lateral segment. Left ventricular diastolic parameters are consistent with Grade II diastolic dysfunction (pseudonormalization).  LV Wall Scoring: Septal and inferior wall akinesis. Right Ventricle:  The right ventricular size is normal. No increase in right ventricular wall thickness. Right ventricular systolic function is mildly reduced. Tricuspid regurgitation signal is inadequate for assessing PA pressure. Left Atrium: Left atrial size was mildly dilated. Right Atrium: Right atrial size was normal in size. Pericardium: There is no evidence of pericardial effusion. Mitral Valve: The mitral valve is degenerative in appearance. There is moderate thickening of the mitral valve leaflet(s). Trivial mitral valve regurgitation. No evidence of mitral valve  stenosis. The mean mitral valve gradient is 2.2 mmHg with average heart rate of 69 bpm. Tricuspid Valve: The tricuspid valve is normal in structure. Tricuspid valve regurgitation is trivial. Aortic Valve: The aortic valve has an indeterminant number of cusps. Aortic valve regurgitation is not visualized. Aortic valve sclerosis is present, with no evidence of aortic valve stenosis. Pulmonic Valve: The pulmonic valve was normal in structure. Pulmonic valve regurgitation is not visualized. Aorta: The aortic root and ascending aorta are structurally normal, with no evidence of dilitation. Venous: The inferior vena cava is dilated in size with less than 50% respiratory variability, suggesting right atrial pressure of 15 mmHg. IAS/Shunts: No atrial level shunt detected by color flow Doppler.  LEFT VENTRICLE PLAX 2D                        Biplane EF (MOD) LVIDd:         4.90 cm         LV Biplane EF:   Left LVIDs:         3.50 cm                          ventricular LV PW:         1.20 cm                          ejection LV IVS:        0.80 cm                          fraction by LVOT diam:     2.00 cm                          2D MOD LV SV:         68                               biplane is LV SV Index:   42                               37.2 %. LVOT Area:     3.14 cm                                Diastology                                LV e' medial:    3.81 cm/s LV Volumes (MOD)               LV E/e' medial:  21.0 LV vol d, MOD    83.6 ml  LV e' lateral:   7.07 cm/s A2C:                           LV E/e' lateral: 11.3 LV vol d, MOD    92.1 ml A4C: LV vol s, MOD    53.2 ml A2C: LV vol s, MOD    58.4 ml A4C: LV SV MOD A2C:   30.4 ml LV SV MOD A4C:   92.1 ml LV SV MOD BP:    33.5 ml RIGHT VENTRICLE RV S prime:     7.94 cm/s TAPSE (M-mode): 2.0 cm LEFT ATRIUM             Index        RIGHT ATRIUM           Index LA diam:        3.00 cm 1.85 cm/m   RA Area:     15.40 cm LA Vol (A2C):   40.7 ml 25.07 ml/m  RA  Volume:   37.60 ml  23.16 ml/m LA Vol (A4C):   59.0 ml 36.34 ml/m LA Biplane Vol: 51.3 ml 31.60 ml/m  AORTIC VALVE LVOT Vmax:   85.40 cm/s LVOT Vmean:  65.800 cm/s LVOT VTI:    0.215 m  AORTA Ao Root diam: 3.30 cm Ao Asc diam:  3.40 cm MITRAL VALVE MV Area (PHT): 4.12 cm     SHUNTS MV Mean grad:  2.2 mmHg     Systemic VTI:  0.22 m MV Decel Time: 184 msec     Systemic Diam: 2.00 cm MV E velocity: 80.20 cm/s MV A velocity: 122.00 cm/s MV E/A ratio:  0.66 Morene Brownie Electronically signed by Morene Brownie Signature Date/Time: 07/03/2024/5:47:33 PM    Final    VAS US  LOWER EXTREMITY VENOUS (DVT) Result Date: 07/03/2024  Lower Venous DVT Study Patient Name:  ANGELISA WINTHROP  Date of Exam:   07/03/2024 Medical Rec #: [id number]       Accession #:    [id number] Date of Birth: 03-Apr-1935       Patient Gender: F Patient Age:   66 years Exam Location:  [facility] Procedure:      VAS US  LOWER EXTREMITY VENOUS (DVT) Referring Phys: DONALDA APPLEBAUM --------------------------------------------------------------------------------  Indications: Swelling.  Risk Factors: None identified. Limitations: Poor ultrasound/tissue interface and patient positioning, patient immobility. Comparison Study: No prior studies. Performing Technologist: Cordella Collet RVT  Examination Guidelines: A complete evaluation includes B-mode imaging, spectral Doppler, color Doppler, and power Doppler as needed of all accessible portions of each vessel. Bilateral testing is considered an integral part of a complete examination. Limited examinations for reoccurring indications may be performed as noted. The reflux portion of the exam is performed with the patient in reverse Trendelenburg.  +---------+---------------+---------+-----------+----------+--------------+ RIGHT    CompressibilityPhasicitySpontaneityPropertiesThrombus Aging +---------+---------------+---------+-----------+----------+--------------+ CFV      Full            Yes      Yes                                 +---------+---------------+---------+-----------+----------+--------------+ SFJ      Full                                                        +---------+---------------+---------+-----------+----------+--------------+  FV Prox  Full                                                        +---------+---------------+---------+-----------+----------+--------------+ FV Mid   Full                                                        +---------+---------------+---------+-----------+----------+--------------+ FV DistalFull                                                        +---------+---------------+---------+-----------+----------+--------------+ PFV      Full                                                        +---------+---------------+---------+-----------+----------+--------------+ POP      Full           Yes      Yes                                 +---------+---------------+---------+-----------+----------+--------------+ PTV      Full                                                        +---------+---------------+---------+-----------+----------+--------------+ PERO     Full                                                        +---------+---------------+---------+-----------+----------+--------------+   +---------+---------------+---------+-----------+----------+--------------+ LEFT     CompressibilityPhasicitySpontaneityPropertiesThrombus Aging +---------+---------------+---------+-----------+----------+--------------+ CFV      Full           Yes      Yes                                 +---------+---------------+---------+-----------+----------+--------------+ SFJ      Full                                                        +---------+---------------+---------+-----------+----------+--------------+ FV Prox  Full                                                         +---------+---------------+---------+-----------+----------+--------------+  FV Mid   Full                                                        +---------+---------------+---------+-----------+----------+--------------+ FV DistalFull                                                        +---------+---------------+---------+-----------+----------+--------------+ PFV      Full                                                        +---------+---------------+---------+-----------+----------+--------------+ POP      Full           Yes      Yes                                 +---------+---------------+---------+-----------+----------+--------------+ PTV      Full                                                        +---------+---------------+---------+-----------+----------+--------------+ PERO     Full                                                        +---------+---------------+---------+-----------+----------+--------------+     Summary: RIGHT: - There is no evidence of deep vein thrombosis in the lower extremity.  - No cystic structure found in the popliteal fossa.  LEFT: - There is no evidence of deep vein thrombosis in the lower extremity.  - No cystic structure found in the popliteal fossa.  *See table(s) above for measurements and observations. Electronically signed by Debby Robertson on 07/03/2024 at 5:02:34 PM.    Final      PERTINENT LAB RESULTS: CBC: Recent Labs    07/02/24 2011 07/04/24 0401  WBC 8.5 7.0  HGB 10.7* 9.6*  HCT 33.0* 28.9*  PLT 198 173   CMET CMP     Component Value Date/Time   NA 137 07/02/2024 2011   K 4.7 07/02/2024 2011   CL 104 07/02/2024 2011   CO2 24 07/02/2024 2011   GLUCOSE 113 (H) 07/02/2024 2011   BUN 22 07/02/2024 2011   CREATININE 0.85 07/02/2024 2011   CREATININE 1.18 (H) 03/10/2024 1619   CALCIUM 9.0 07/02/2024 2011   PROT 5.6 (L) 07/02/2024 2011   ALBUMIN 3.1 (L) 07/02/2024 2011   AST 34 07/02/2024  2011   ALT 29 07/02/2024 2011   ALKPHOS 111 07/02/2024 2011   BILITOT 0.8 07/02/2024 2011   EGFR 51 (L) 01/07/2024 1417   GFRNONAA >60 07/02/2024 2011    GFR Estimated Creatinine Clearance:  42 mL/min (by C-G formula based on SCr of 0.85 mg/dL). No results for input(s): LIPASE, AMYLASE in the last 72 hours. No results for input(s): CKTOTAL, CKMB, CKMBINDEX, TROPONINI in the last 72 hours. Invalid input(s): POCBNP No results for input(s): DDIMER in the last 72 hours. No results for input(s): HGBA1C in the last 72 hours. No results for input(s): CHOL, HDL, LDLCALC, TRIG, CHOLHDL, LDLDIRECT in the last 72 hours. No results for input(s): TSH, T4TOTAL, T3FREE, THYROIDAB in the last 72 hours.  Invalid input(s): FREET3 No results for input(s): VITAMINB12, FOLATE, FERRITIN, TIBC, IRON, RETICCTPCT in the last 72 hours. Coags: No results for input(s): INR in the last 72 hours.  Invalid input(s): PT Microbiology: No results found for this or any previous visit (from the past 240 hours).  FURTHER DISCHARGE INSTRUCTIONS:  Get Medicines reviewed and adjusted: Please take all your medications with you for your next visit with your Primary MD  Laboratory/radiological data: Please request your Primary MD to go over all hospital tests and procedure/radiological results at the follow up, please ask your Primary MD to get all Hospital records sent to his/her office.  In some cases, they will be blood work, cultures and biopsy results pending at the time of your discharge. Please request that your primary care M.D. goes through all the records of your hospital data and follows up on these results.  Also Note the following: If you experience worsening of your admission symptoms, develop shortness of breath, life threatening emergency, suicidal or homicidal thoughts you must seek medical attention immediately by calling 911 or calling your MD  immediately  if symptoms less severe.  You must read complete instructions/literature along with all the possible adverse reactions/side effects for all the Medicines you take and that have been prescribed to you. Take any new Medicines after you have completely understood and accpet all the possible adverse reactions/side effects.   Do not drive when taking Pain medications or sleeping medications (Benzodaizepines)  Do not take more than prescribed Pain, Sleep and Anxiety Medications. It is not advisable to combine anxiety,sleep and pain medications without talking with your primary care practitioner  Special Instructions: If you have smoked or chewed Tobacco  in the last 2 yrs please stop smoking, stop any regular Alcohol  and or any Recreational drug use.  Wear Seat belts while driving.  Please note: You were cared for by a hospitalist during your hospital stay. Once you are discharged, your primary care physician will handle any further medical issues. Please note that NO REFILLS for any discharge medications will be authorized once you are discharged, as it is imperative that you return to your primary care physician (or establish a relationship with a primary care physician if you do not have one) for your post hospital discharge needs so that they can reassess your need for medications and monitor your lab values.  Total Time spent coordinating discharge including counseling, education and face to face time equals greater than 30 minutes.  Signed: Lakia Gritton 07/05/2024 9:15 AM

## 2024-07-05 NOTE — Consult Note (Signed)
 WOC team consulted for sacral wound. Secure chat to primary team requesting photo documentation of wound.   Please note that the [facility] nursing team is utilizing a standardized work plan to manage patient consults. We are triaging consults and will try to see the patients within 48 hours. Wound photos in the patient's chart allow us  to consult on the patient in the most efficient and timely manner.    Thank you,    Powell Bar MSN, RN-BC, [employer]

## 2024-07-07 ENCOUNTER — Ambulatory Visit: Payer: PPO | Admitting: Nurse Practitioner

## 2024-08-06 ENCOUNTER — Ambulatory Visit: Admitting: Physician Assistant

## 2024-09-11 ENCOUNTER — Other Ambulatory Visit (HOSPITAL_COMMUNITY): Payer: Self-pay

## 2024-11-17 DEATH — deceased

## 2025-01-13 ENCOUNTER — Encounter: Payer: PPO | Admitting: Dermatology

## 2025-06-08 ENCOUNTER — Ambulatory Visit: Payer: Self-pay | Admitting: Nurse Practitioner
